# Patient Record
Sex: Female | Born: 1935 | Race: White | Hispanic: No | Smoking: Never smoker
Health system: Southern US, Community
[De-identification: ages and names within clinical notes are randomized; demographics above are authoritative.]

## PROBLEM LIST (undated history)

## (undated) DIAGNOSIS — M1990 Unspecified osteoarthritis, unspecified site: Secondary | ICD-10-CM

## (undated) DIAGNOSIS — F0390 Unspecified dementia without behavioral disturbance: Secondary | ICD-10-CM

## (undated) HISTORY — PX: EYE SURGERY: SHX253

## (undated) HISTORY — DX: Unspecified dementia, unspecified severity, without behavioral disturbance, psychotic disturbance, mood disturbance, and anxiety: F03.90

## (undated) HISTORY — DX: Unspecified osteoarthritis, unspecified site: M19.90

## (undated) HISTORY — PX: BREAST SURGERY: SHX581

---

## 2002-10-11 ENCOUNTER — Encounter: Payer: Self-pay | Admitting: Family Medicine

## 2002-10-11 ENCOUNTER — Ambulatory Visit (HOSPITAL_COMMUNITY): Admission: RE | Admit: 2002-10-11 | Discharge: 2002-10-11 | Payer: Self-pay | Admitting: Family Medicine

## 2005-02-01 ENCOUNTER — Emergency Department (HOSPITAL_COMMUNITY): Admission: EM | Admit: 2005-02-01 | Discharge: 2005-02-02 | Payer: Self-pay | Admitting: *Deleted

## 2005-02-03 ENCOUNTER — Ambulatory Visit (HOSPITAL_COMMUNITY): Admission: RE | Admit: 2005-02-03 | Discharge: 2005-02-03 | Payer: Self-pay | Admitting: Family Medicine

## 2005-05-14 ENCOUNTER — Ambulatory Visit: Payer: Self-pay | Admitting: Internal Medicine

## 2005-06-04 ENCOUNTER — Ambulatory Visit: Payer: Self-pay | Admitting: Internal Medicine

## 2005-06-04 ENCOUNTER — Encounter (INDEPENDENT_AMBULATORY_CARE_PROVIDER_SITE_OTHER): Payer: Self-pay | Admitting: Internal Medicine

## 2005-06-04 ENCOUNTER — Ambulatory Visit (HOSPITAL_COMMUNITY): Admission: RE | Admit: 2005-06-04 | Discharge: 2005-06-04 | Payer: Self-pay | Admitting: Internal Medicine

## 2005-07-14 ENCOUNTER — Ambulatory Visit: Payer: Self-pay | Admitting: Internal Medicine

## 2005-10-20 ENCOUNTER — Ambulatory Visit: Payer: Self-pay | Admitting: Internal Medicine

## 2005-11-17 ENCOUNTER — Ambulatory Visit: Payer: Self-pay | Admitting: Internal Medicine

## 2005-12-01 ENCOUNTER — Ambulatory Visit (HOSPITAL_COMMUNITY): Admission: RE | Admit: 2005-12-01 | Discharge: 2005-12-01 | Payer: Self-pay | Admitting: Family Medicine

## 2007-08-05 ENCOUNTER — Emergency Department (HOSPITAL_COMMUNITY): Admission: EM | Admit: 2007-08-05 | Discharge: 2007-08-05 | Payer: Self-pay | Admitting: Emergency Medicine

## 2007-11-18 ENCOUNTER — Ambulatory Visit (HOSPITAL_COMMUNITY): Admission: RE | Admit: 2007-11-18 | Discharge: 2007-11-18 | Payer: Self-pay | Admitting: Family Medicine

## 2007-11-26 ENCOUNTER — Emergency Department (HOSPITAL_COMMUNITY): Admission: EM | Admit: 2007-11-26 | Discharge: 2007-11-26 | Payer: Self-pay | Admitting: Emergency Medicine

## 2008-11-08 ENCOUNTER — Ambulatory Visit (HOSPITAL_COMMUNITY): Admission: RE | Admit: 2008-11-08 | Discharge: 2008-11-08 | Payer: Self-pay | Admitting: Family Medicine

## 2008-11-10 ENCOUNTER — Ambulatory Visit (HOSPITAL_COMMUNITY): Admission: RE | Admit: 2008-11-10 | Discharge: 2008-11-10 | Payer: Self-pay | Admitting: Family Medicine

## 2010-08-25 ENCOUNTER — Encounter: Payer: Self-pay | Admitting: Neurosurgery

## 2010-12-20 NOTE — Consult Note (Signed)
NAMEPATRIC, VANPELT                ACCOUNT NO.:  [id number]   MEDICAL RECORD NO.:  [id number]          PATIENT TYPE:  AMB   LOCATION:  DAY                           FACILITY:  [facility]   PHYSICIAN:  Lionel December, M.D.    DATE OF BIRTH:  13-Nov-1924   DATE OF CONSULTATION:  05/14/2005  DATE OF DISCHARGE:                                   CONSULTATION   PRESENTING COMPLAINT:  Intractable constipation.   HISTORY OF PRESENT ILLNESS:  [name] is a 77 year old Caucasian female who  was referred through the courtesy of Dr. Nobie Putnam for GI evaluation.  She  states she has had constipation for several years, has been gradually  getting worse.  Back in July, she developed fecal impaction.  She recalls  stool got so dry and hard that it would not come out.  This was associated  with abdominal pain, but no nausea and vomiting.  She has tried lactulose  and bulk forming agents in the past, in addition to, a lot of OTC  medications, but she does not remember the effects.  She states that with  the medicine she is taking now, she has pasty, multiple stools per day.  Today, she has had six stools.  She is also bothered by the fact that she  cannot keep herself clean.  She has a good appetite.  She has not lost any  weight recently.  She denies melena or rectal bleeding.  She had colonoscopy  by me in October 2001 with removal of two tiny polyps which were  inflammatory.  She denies heartburn, dysphagia.   MEDICATIONS:  1.  Dulcolax 17 g daily.  2.  Zelnorm 6 mg b.i.d.  3.  Evista 60 mg daily.  4.  Glucosamine one daily.  5.  Calcium daily.  6.  Red Yeast Rice daily.  7.  Garlic daily.   PAST MEDICAL HISTORY:  1.  Negative other than chronic constipation.  2.  A benign lump was removed from each breast back in 1974.  3.  Bilateral cataract surgery.   ALLERGIES:  NK.   FAMILY HISTORY:  Both parents died because of heart problems, mother aged 6  and father age 16.  She has five sisters and two  brothers living.  Three  sisters and two brothers are deceased.  One sister died of lung cancer at  age 42, another one of colon carcinoma in her 24s.  One sister died of  diabetic complications.  One brother died of leukemia in his 4s.  Another  brother died of unknown cancer at age 26.  A third brother had CAD and  diabetes mellitus.   SOCIAL HISTORY:  She is married.  She does not have any children.  She  worked at [employer] for 39 years before she retired.  She has  never smoked cigarettes or drank alcohol.   PHYSICAL EXAMINATION:  GENERAL APPEARANCE:  Pleasant, well-developed, well-  nourished Caucasian female who is in no acute distress.  VITAL SIGNS:  Weight 131, height 5 feet 4 inches, pulse 64, blood pressure  120/62,  temperature 98.1.  HEENT:  Conjunctivae is pink.  Sclerae is  nonicteric.  Oropharyngeal mucosa is normal.  NECK:  Without masses or thyromegaly.  CARDIAC:  Regular rhythm.  Normal S1, S2.  No murmur or gallop noted.  LUNGS:  Clear to auscultation.  ABDOMEN:  Symmetric.  Bowel sounds are normal.  On palpation, soft and  nontender.  Sigmoid colon is palpable.  No hepatosplenomegaly noted.  RECTAL:  Normal sphincter tone.  She has scant amount of stool in the vault  which is guaiac negative.  No edema or clubbing.   LABORATORY DATA:  On July 2006, when she was admitted for infection and  abdominal pain, WBC 7.4, H&H 12 and 34.6, platelets 219,000.  Chemistry  panel was normal.  Serum calcium was 9.2.  Labs available, I do not see TSH.   She also had a chest CT which revealed multiple areas of lung scarring and  no definite mass.  Follow up CT in 4-6 months was recommended.   ASSESSMENT:  Claris is a healthy 77 year old Caucasian female who has a  several year history of constipation which is progressive.  She does not  have any alarm symptoms.  Her last colonoscopy was five years ago and was  unremarkable other than two inflammatory polyps.  I  suspect she has some  colonic dysmotility.  Hypothyroidism needs to be resolved, although,  clinically she does not appear to be hyperthyroid.  She is having multiple  pasty stools.  Feel this is because of combination therapy.  Would like to  try her on one other regime and see what happens.   Positive family history of colon carcinoma as well as multiple other  malignancies. Therefore, she should undergo high risk screening colonoscopy.   RECOMMENDATIONS:  1.  High fiber diet.  2.  Trial with Fiber Choice two tablets daily or Benefiber 4 g daily,      samples given.  3.  We will check her hospital records, and do a TSH, unless she has had one      within the last year.  4.  She will continue Zelnorm at 6 mg p.o. b.i.d., but stop Glycolax.  5.  She will keep a stool diary.  She will undergo colonoscopy in 2-3 weeks.      Further changes in therapy will be made at that time.  6.  We will plan to see her back in the office in about two months.   We would like to thank Dr. Nobie Putnam for the opportunity to participate in  the care of this nice lady.      Lionel December, M.D.  Electronically Signed     NR/MEDQ  D:  05/14/2005  T:  05/15/2005  Job:  [id number]

## 2010-12-20 NOTE — Op Note (Signed)
[name], WAXMAN                ACCOUNT NO.:  [id number]   MEDICAL RECORD NO.:  [id number]          PATIENT TYPE:  AMB   LOCATION:  DAY                           FACILITY:  [facility]   PHYSICIAN:  Lionel December, M.D.    DATE OF BIRTH:  1925-06-02   DATE OF PROCEDURE:  06/04/2005  DATE OF DISCHARGE:                                 OPERATIVE REPORT   PROCEDURE:  Colonoscopy.   INDICATIONS:  Arna is a 77 year old Caucasian female who who presents with  progressive constipation. She recently had to be treated for fecal  impaction. Her last colonoscopy was five years ago with inflammatory polyps  only. Family history is positive for colon carcinoma as well as multiple  other malignancies. One sister died of colon carcinoma, one sister died of  lung carcinoma, a brother had leukemia, and yet another brother died of  unknown malignancy.   Procedure risks were reviewed with the patient, and informed consent was  obtained.   PREMEDICATION:  Demerol 25 mg IV, Versed 3 mg IV.   FINDINGS:  Procedure performed in endoscopy suite. The patient's vital signs  and O2 saturation were monitored during the procedure and remained stable.  The patient was placed in left lateral position and rectal examination  performed. No abnormality noted on external or digital exam. Olympus  videoscope was placed in the rectum where mucosa was noted to be densely  pigmented and almost black. Slowly and carefully scope was passed into  sigmoid colon and beyond. There was virtual diffuse deposition of pigment  involving the colon. Scope was advanced to cecum which was identified by  appendiceal orifice and ileocecal valve. Pictures taken for the record.  There were two small polyps and/or pseudopolyps at cecum which were ablated  via by cold biopsy, and there were two more at transverse colon which were  ablated similar fashion. No other abnormalities noted. Scope was retroflexed  in rectum to examine anorectal  junction which was unremarkable. Endoscope  was straightened and withdrawn. The patient tolerated the procedure well.   FINAL DIAGNOSIS:  1.  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  2.  Four small polyps ablated via cold biopsy, two from cecum and two from      transverse colon.   RECOMMENDATIONS:  Since she is doing better with the Zelnorm and dietary  measures, she will continue this therapy, and she has an appointment to be  seen in the office in about six weeks. I will be contacting the patient with  biopsy results.      Lionel December, M.D.  Electronically Signed     NR/MEDQ  D:  06/04/2005  T:  06/04/2005  Job:  [id number]   cc:   Patrica Duel, M.D.  Fax: [phone]

## 2011-04-24 LAB — URINALYSIS, ROUTINE W REFLEX MICROSCOPIC
Bilirubin Urine: NEGATIVE
Nitrite: NEGATIVE
Specific Gravity, Urine: >1.03 — ABNORMAL HIGH
Urobilinogen, UA: 0.2

## 2011-04-24 LAB — I-STAT 8, (EC8 V) (CONVERTED LAB)
Glucose, Bld: 120 — ABNORMAL HIGH
Potassium: 3.7
TCO2: 26
pH, Ven: 7.404 — ABNORMAL HIGH

## 2011-04-24 LAB — URINE MICROSCOPIC-ADD ON: Urine-Other: NONE SEEN

## 2011-04-24 LAB — POCT I-STAT CREATININE: Operator id: 264761

## 2011-04-29 LAB — POCT CARDIAC MARKERS
Operator id: 218581
Troponin i, poc: <0.05

## 2011-07-26 ENCOUNTER — Emergency Department (HOSPITAL_COMMUNITY)
Admission: EM | Admit: 2011-07-26 | Discharge: 2011-07-26 | Disposition: A | Discharge status: Home / Self Care | Payer: Medicare Other | Attending: Emergency Medicine | Admitting: Emergency Medicine

## 2011-07-26 ENCOUNTER — Encounter: Payer: Self-pay | Admitting: *Deleted

## 2011-07-26 DIAGNOSIS — R070 Pain in throat: Secondary | ICD-10-CM | POA: Insufficient documentation

## 2011-07-26 DIAGNOSIS — R059 Cough, unspecified: Secondary | ICD-10-CM | POA: Insufficient documentation

## 2011-07-26 DIAGNOSIS — M542 Cervicalgia: Secondary | ICD-10-CM | POA: Insufficient documentation

## 2011-07-26 DIAGNOSIS — R05 Cough: Secondary | ICD-10-CM | POA: Insufficient documentation

## 2011-07-26 DIAGNOSIS — J019 Acute sinusitis, unspecified: Secondary | ICD-10-CM

## 2011-07-26 DIAGNOSIS — R490 Dysphonia: Secondary | ICD-10-CM | POA: Insufficient documentation

## 2011-07-26 DIAGNOSIS — J069 Acute upper respiratory infection, unspecified: Secondary | ICD-10-CM

## 2011-07-26 DIAGNOSIS — R599 Enlarged lymph nodes, unspecified: Secondary | ICD-10-CM | POA: Insufficient documentation

## 2011-07-26 DIAGNOSIS — J3489 Other specified disorders of nose and nasal sinuses: Secondary | ICD-10-CM | POA: Insufficient documentation

## 2011-07-26 MED ORDER — AZITHROMYCIN 250 MG PO TABS: 500.0000 mg | ORAL_TABLET | Freq: Every day | ORAL | Status: AC

## 2011-07-26 MED ORDER — IBUPROFEN 400 MG PO TABS: 400.0000 mg | ORAL_TABLET | Freq: Three times a day (TID) | ORAL | Status: AC | PRN

## 2011-07-26 NOTE — ED Provider Notes (Signed)
This chart was scribed for Lydia Bonier, MD by [name]. The patient was seen in room [facility] and the patient's care was started at 10:26 PM.   CSN: [id number]  Arrival date & time 07/26/11  2107   First MD Initiated Contact with Patient 07/26/11 2155      Chief Complaint  Patient presents with  . Sore Throat  . Cough  . Nasal Congestion  . Neck Pain    (Consider location/radiation/quality/duration/timing/severity/associated sxs/prior treatment) HPI  Pt seen at 10:26 PM [name] is a 77 y.o. female who presents to the Emergency Department complaining of gradual onset, gradually worsening, moderate cough that started 3-4 weeks ago. Pt c/o associated sore throat, congestion, hoarseness, right neck pain, and swollen lymph node in neck. Pt has self treated with Mucinex with moderate relief of sx.  Pt denies fever, n/v/d.    History reviewed. No pertinent past medical history.  History reviewed. No pertinent past surgical history.  History reviewed. No pertinent family history.  History  Substance Use Topics  . Smoking status: Never Smoker   . Smokeless tobacco: Not on file  . Alcohol Use: No    OB History    Grav Para Term Preterm Abortions TAB SAB Ect Mult Living                  Review of Systems  10 Systems reviewed and are negative for acute change except as noted in the HPI.   Allergies  Review of patient's allergies indicates no known allergies.  Home Medications   Current Outpatient Rx  Name Route Sig Dispense Refill  . VITAMIN C PO Oral Take 1 tablet by mouth daily. Patient unsure of strength     . VITAMIN D PO Oral Take 1 tablet by mouth daily. Patient unsure of strength     . PRESCRIPTION MEDICATION Oral Take 1 tablet by mouth daily. Patient takes medication for nerves unsure of name and strength     . RALOXIFENE HCL 60 MG PO TABS Oral Take 60 mg by mouth daily.        BP 150/68  Pulse 86  Temp 98.1 F (36.7 C)  Resp 20  Ht  5\' 2"  (1.575 m)  Wt 119 lb (53.978 kg)  BMI 21.77 kg/m2  SpO2 100%  Physical Exam  Nursing note and vitals reviewed. Constitutional: She is oriented to person, place, and time. She appears well-developed and well-nourished. No distress.  HENT:  Head: Normocephalic and atraumatic.  Mouth/Throat: Oropharynx is clear and moist.       Right TM normal Left TM normal Moist mucus membranes  Eyes: EOM are normal. Pupils are equal, round, and reactive to light. No scleral icterus.  Neck: Neck supple. No tracheal deviation present.       Right postauricular lymphadenopathy Bilateral cervical chain lymphadenopathy  Cardiovascular: Normal rate, regular rhythm and normal heart sounds.   Pulmonary/Chest: Effort normal and breath sounds normal. No respiratory distress. She has no wheezes. She has no rales.  Abdominal: Soft. She exhibits no distension.  Musculoskeletal: Normal range of motion. She exhibits no edema.  Neurological: She is alert and oriented to person, place, and time. No sensory deficit.  Skin: Skin is warm and dry.  Psychiatric: She has a normal mood and affect. Her behavior is normal.    ED Course  Procedures (including critical care time) DIAGNOSTIC STUDIES: Oxygen Saturation is 100% on room air, normal by my interpretation.    COORDINATION OF CARE:  Labs Reviewed - No data to display No results found.   No diagnosis found.    MDM    The pt has had nasal congestion and post nasal drip for approximately 4 weeks without fever and on examination she has nasal congestion but no sign of ear or throat infection and no apparent pneumonia. At this time I have dx her with a sinus infection and will initiate antibiotics and pt will follow up with pcp.    I personally performed the services described in this documentation, which was scribed in my presence. The recorded information has been reviewed and considered.   Lydia Bonier, MD 07/26/11 [phone]

## 2011-07-26 NOTE — ED Notes (Signed)
Pt c/o sore throat, cough, congestion, hoarse, and right sided neck pain (3-4 months)

## 2011-09-15 ENCOUNTER — Other Ambulatory Visit (HOSPITAL_COMMUNITY): Payer: Self-pay | Admitting: Family Medicine

## 2011-09-15 DIAGNOSIS — R220 Localized swelling, mass and lump, head: Secondary | ICD-10-CM

## 2011-09-15 DIAGNOSIS — R221 Localized swelling, mass and lump, neck: Secondary | ICD-10-CM

## 2011-09-15 DIAGNOSIS — Z79899 Other long term (current) drug therapy: Secondary | ICD-10-CM | POA: Diagnosis not present

## 2011-09-15 DIAGNOSIS — Z139 Encounter for screening, unspecified: Secondary | ICD-10-CM

## 2011-09-15 DIAGNOSIS — M810 Age-related osteoporosis without current pathological fracture: Secondary | ICD-10-CM | POA: Diagnosis not present

## 2011-09-15 DIAGNOSIS — IMO0002 Reserved for concepts with insufficient information to code with codable children: Secondary | ICD-10-CM | POA: Diagnosis not present

## 2011-09-19 ENCOUNTER — Ambulatory Visit (HOSPITAL_COMMUNITY)
Admission: RE | Admit: 2011-09-19 | Discharge: 2011-09-19 | Disposition: A | Discharge status: Home / Self Care | Payer: Medicare Other | Source: Ambulatory Visit | Attending: Family Medicine | Admitting: Family Medicine

## 2011-09-19 DIAGNOSIS — M810 Age-related osteoporosis without current pathological fracture: Secondary | ICD-10-CM | POA: Diagnosis not present

## 2011-09-19 DIAGNOSIS — M818 Other osteoporosis without current pathological fracture: Secondary | ICD-10-CM | POA: Insufficient documentation

## 2011-09-19 DIAGNOSIS — D101 Benign neoplasm of tongue: Secondary | ICD-10-CM | POA: Diagnosis not present

## 2011-09-19 DIAGNOSIS — Z139 Encounter for screening, unspecified: Secondary | ICD-10-CM

## 2011-09-19 DIAGNOSIS — R220 Localized swelling, mass and lump, head: Secondary | ICD-10-CM | POA: Insufficient documentation

## 2011-09-19 DIAGNOSIS — Z13820 Encounter for screening for osteoporosis: Secondary | ICD-10-CM | POA: Insufficient documentation

## 2011-09-19 DIAGNOSIS — I6529 Occlusion and stenosis of unspecified carotid artery: Secondary | ICD-10-CM | POA: Diagnosis not present

## 2011-09-19 DIAGNOSIS — R221 Localized swelling, mass and lump, neck: Secondary | ICD-10-CM

## 2011-09-19 DIAGNOSIS — K219 Gastro-esophageal reflux disease without esophagitis: Secondary | ICD-10-CM | POA: Diagnosis not present

## 2011-09-19 DIAGNOSIS — R490 Dysphonia: Secondary | ICD-10-CM | POA: Diagnosis not present

## 2011-09-19 DIAGNOSIS — R05 Cough: Secondary | ICD-10-CM | POA: Diagnosis not present

## 2011-09-24 DIAGNOSIS — H9209 Otalgia, unspecified ear: Secondary | ICD-10-CM | POA: Diagnosis not present

## 2011-09-24 DIAGNOSIS — K219 Gastro-esophageal reflux disease without esophagitis: Secondary | ICD-10-CM | POA: Diagnosis not present

## 2011-09-24 DIAGNOSIS — D101 Benign neoplasm of tongue: Secondary | ICD-10-CM | POA: Diagnosis not present

## 2011-09-24 DIAGNOSIS — R490 Dysphonia: Secondary | ICD-10-CM | POA: Diagnosis not present

## 2011-10-10 DIAGNOSIS — J309 Allergic rhinitis, unspecified: Secondary | ICD-10-CM | POA: Diagnosis not present

## 2011-10-10 DIAGNOSIS — IMO0002 Reserved for concepts with insufficient information to code with codable children: Secondary | ICD-10-CM | POA: Diagnosis not present

## 2011-11-12 DIAGNOSIS — M898X9 Other specified disorders of bone, unspecified site: Secondary | ICD-10-CM | POA: Diagnosis not present

## 2011-11-12 DIAGNOSIS — M2040 Other hammer toe(s) (acquired), unspecified foot: Secondary | ICD-10-CM | POA: Diagnosis not present

## 2011-11-20 ENCOUNTER — Other Ambulatory Visit: Payer: Self-pay | Admitting: Cardiology

## 2011-11-20 DIAGNOSIS — I739 Peripheral vascular disease, unspecified: Secondary | ICD-10-CM

## 2011-11-25 ENCOUNTER — Encounter (INDEPENDENT_AMBULATORY_CARE_PROVIDER_SITE_OTHER): Payer: Medicare Other

## 2011-11-25 DIAGNOSIS — I739 Peripheral vascular disease, unspecified: Secondary | ICD-10-CM

## 2012-03-20 ENCOUNTER — Emergency Department (HOSPITAL_COMMUNITY): Payer: Medicare Other

## 2012-03-20 ENCOUNTER — Encounter (HOSPITAL_COMMUNITY): Payer: Self-pay | Admitting: Emergency Medicine

## 2012-03-20 ENCOUNTER — Emergency Department (HOSPITAL_COMMUNITY)
Admission: EM | Admit: 2012-03-20 | Discharge: 2012-03-20 | Disposition: A | Discharge status: Home / Self Care | Payer: Medicare Other | Attending: Emergency Medicine | Admitting: Emergency Medicine

## 2012-03-20 DIAGNOSIS — R079 Chest pain, unspecified: Secondary | ICD-10-CM | POA: Insufficient documentation

## 2012-03-20 DIAGNOSIS — J984 Other disorders of lung: Secondary | ICD-10-CM | POA: Insufficient documentation

## 2012-03-20 DIAGNOSIS — R071 Chest pain on breathing: Secondary | ICD-10-CM | POA: Diagnosis not present

## 2012-03-20 DIAGNOSIS — Z043 Encounter for examination and observation following other accident: Secondary | ICD-10-CM | POA: Diagnosis not present

## 2012-03-20 LAB — POCT I-STAT, CHEM 8
Chloride: 103 mEq/L (ref 96–112)
Glucose, Bld: 94 mg/dL (ref 70–99)
HCT: 34 % — ABNORMAL LOW (ref 36.0–46.0)
Potassium: 3.7 mEq/L (ref 3.5–5.1)
Sodium: 138 mEq/L (ref 135–145)

## 2012-03-20 LAB — POCT I-STAT TROPONIN I: Troponin i, poc: 0 ng/mL (ref 0.00–0.08)

## 2012-03-20 LAB — CBC
HCT: 33.8 % — ABNORMAL LOW (ref 36.0–46.0)
Platelets: 144 10*3/uL — ABNORMAL LOW (ref 150–400)
RDW: 12.5 % (ref 11.5–15.5)
WBC: 3.7 10*3/uL — ABNORMAL LOW (ref 4.0–10.5)

## 2012-03-20 LAB — LIPASE, BLOOD: Lipase: 23 U/L (ref 11–59)

## 2012-03-20 MED ORDER — HYDROCODONE-ACETAMINOPHEN 5-325 MG PO TABS
1.0000 | ORAL_TABLET | Freq: Once | ORAL | Status: AC
  Administered 2012-03-20: 1 via ORAL
  Filled 2012-03-20: qty 1

## 2012-03-20 MED ORDER — ONDANSETRON HCL 4 MG/2ML IJ SOLN
4.0000 mg | Freq: Once | INTRAMUSCULAR | Status: AC
  Administered 2012-03-20: 4 mg via INTRAVENOUS
  Filled 2012-03-20: qty 2

## 2012-03-20 MED ORDER — MORPHINE SULFATE 2 MG/ML IJ SOLN
2.0000 mg | Freq: Once | INTRAMUSCULAR | Status: AC
  Administered 2012-03-20: 2 mg via INTRAVENOUS
  Filled 2012-03-20: qty 1

## 2012-03-20 MED ORDER — HYDROCODONE-ACETAMINOPHEN 5-500 MG PO TABS: 1.0000 | ORAL_TABLET | Freq: Four times a day (QID) | ORAL | Status: AC | PRN

## 2012-03-20 MED ORDER — IOHEXOL 350 MG/ML SOLN
100.0000 mL | Freq: Once | INTRAVENOUS | Status: AC | PRN
  Administered 2012-03-20: 100 mL via INTRAVENOUS

## 2012-03-20 NOTE — ED Notes (Signed)
Patient c/o left rib/side pain "for a while".  Patient states she fell last week.

## 2012-03-20 NOTE — ED Provider Notes (Signed)
History     CSN: [id number]  Arrival date & time 03/20/12  0210   First MD Initiated Contact with Patient 03/20/12 0251      Chief Complaint  Patient presents with  . Pleurisy    (Consider location/radiation/quality/duration/timing/severity/associated sxs/prior treatment) HPI HX per patient, L sided CP worse with deep breaths ongoing for the last 3 -4 days, seems to be worse at night when lays down to go to sleep. Worse with deep breaths, no SOB. No cough, did fall about a week ago. No sig pain at that time. No leg pain/ swelling, no rash. No N/V/D. No F/C. No h/o same. Does eat a lot of cabbage and beans and concerned it may be gas. Mod in severity, no radiation of pain described as dull and crampy History reviewed. No pertinent past medical history.  History reviewed. No pertinent past surgical history.  No family history on file.  History  Substance Use Topics  . Smoking status: Never Smoker   . Smokeless tobacco: Not on file  . Alcohol Use: No    OB History    Grav Para Term Preterm Abortions TAB SAB Ect Mult Living                  Review of Systems  Constitutional: Negative for fever and chills.  HENT: Negative for neck pain and neck stiffness.   Eyes: Negative for pain.  Respiratory: Negative for shortness of breath.   Cardiovascular: Positive for chest pain.  Gastrointestinal: Negative for abdominal pain.  Genitourinary: Negative for dysuria.  Musculoskeletal: Negative for back pain.  Skin: Negative for rash.  Neurological: Negative for headaches.  All other systems reviewed and are negative.    Allergies  Review of patient's allergies indicates no known allergies.  Home Medications   Current Outpatient Rx  Name Route Sig Dispense Refill  . VITAMIN C PO Oral Take 1 tablet by mouth daily. Patient unsure of strength     . VITAMIN D PO Oral Take 1 tablet by mouth daily. Patient unsure of strength     . RALOXIFENE HCL 60 MG PO TABS Oral Take 60 mg by  mouth daily.      Marland Kitchen PRESCRIPTION MEDICATION Oral Take 1 tablet by mouth daily. Patient takes medication for nerves unsure of name and strength       BP 188/88  Pulse 94  Temp 98.2 F (36.8 C) (Oral)  Resp 18  Ht 5\' 2"  (1.575 m)  Wt 118 lb (53.524 kg)  BMI 21.58 kg/m2  SpO2 99%  Physical Exam  Constitutional: She is oriented to person, place, and time. She appears well-developed and well-nourished.  HENT:  Head: Normocephalic and atraumatic.  Eyes: Conjunctivae and EOM are normal. Pupils are equal, round, and reactive to light.  Neck: Trachea normal. Neck supple. No thyromegaly present.  Cardiovascular: Normal rate, regular rhythm, S1 normal, S2 normal and normal pulses.     No systolic murmur is present   No diastolic murmur is present  Pulses:      Radial pulses are 2+ on the right side, and 2+ on the left side.  Pulmonary/Chest: Effort normal and breath sounds normal. She has no wheezes. She has no rhonchi. She has no rales. She exhibits no tenderness.       Localizes pain to L lateral chest wall. No rash or crepitus  Abdominal: Soft. Normal appearance and bowel sounds are normal. There is no tenderness. There is no CVA tenderness and negative Murphy's sign.  Musculoskeletal:       BLE:s Calves nontender, no cords or erythema, negative Homans sign  Neurological: She is alert and oriented to person, place, and time. She has normal strength. No cranial nerve deficit or sensory deficit. GCS eye subscore is 4. GCS verbal subscore is 5. GCS motor subscore is 6.  Skin: Skin is warm and dry. No rash noted. She is not diaphoretic.  Psychiatric: Her speech is normal.       Cooperative and appropriate    ED Course  Procedures (including critical care time)  ' Results for orders placed during the hospital encounter of 03/20/12  CBC      Component Value Range   WBC 3.7 (*) 4.0 - 10.5 K/uL   RBC 3.65 (*) 3.87 - 5.11 MIL/uL   Hemoglobin 11.5 (*) 12.0 - 15.0 g/dL   HCT 96.2 (*) 95.2  - 46.0 %   MCV 92.6  78.0 - 100.0 fL   MCH 31.5  26.0 - 34.0 pg   MCHC 34.0  30.0 - 36.0 g/dL   RDW 84.1  32.4 - 40.1 %   Platelets 144 (*) 150 - 400 K/uL  POCT I-STAT, CHEM 8      Component Value Range   Sodium 138  135 - 145 mEq/L   Potassium 3.7  3.5 - 5.1 mEq/L   Chloride 103  96 - 112 mEq/L   BUN 5 (*) 6 - 23 mg/dL   Creatinine, Ser 0.27  0.50 - 1.10 mg/dL   Glucose, Bld 94  70 - 99 mg/dL   Calcium, Ion 2.53  6.64 - 1.30 mmol/L   TCO2 26  0 - 100 mmol/L   Hemoglobin 11.6 (*) 12.0 - 15.0 g/dL   HCT 40.3 (*) 47.4 - 25.9 %  POCT I-STAT TROPONIN I      Component Value Range   Troponin i, poc 0.00  0.00 - 0.08 ng/mL   Comment 3           LIPASE, BLOOD      Component Value Range   Lipase 23  11 - 59 U/L   Dg Chest 2 View  03/20/2012  *RADIOLOGY REPORT*  Clinical Data: Left rib pain.  Fell last week.  CHEST - 2 VIEW  Comparison: 11/26/2007  Findings: Normal heart size and pulmonary vascularity. Emphysematous changes and scattered fibrosis in the lungs.  Chronic blunting of the right costophrenic angle suggesting pleural thickening.  No focal airspace consolidation in the lungs.  No pneumothorax.  Mediastinal contours appear intact.  Calcification of the aorta.  No significant change since previous study.  IMPRESSION: Emphysematous changes and scattered fibrosis in the lungs.  Right pleural thickening.  No acute abnormalities.  Original Report Authenticated By: Marlon Pel, M.D.   Ct Angio Chest Pe W/cm &/or Wo Cm  03/20/2012  *RADIOLOGY REPORT*  Clinical Data: Left-sided chest and rib pain.  Fell last week.  CT ANGIOGRAPHY CHEST  Technique:  Multidetector CT imaging of the chest using the standard protocol during bolus administration of intravenous contrast. Multiplanar reconstructed images including MIPs were obtained and reviewed to evaluate the vascular anatomy.  Contrast: OMNIPAQUE IOHEXOL 350 MG/ML SOLN  Comparison: 02/03/2005  Findings: Technically adequate study with  good opacification of the central and segmental pulmonary arteries.  No focal filling defects.  No evidence of significant pulmonary embolus.  Mild cardiac enlargement.  Coronary artery calcification. Calcification of the thoracic aorta without aneurysm.  Calcified hilar lymph nodes.  No significant lymphadenopathy  in the chest. The esophagus is decompressed.  Emphysematous changes and fibrosis in the lungs.  Scarring in the right lung base anteriorly.  A focal area the patient in the right upper lung with radiating scar to the pleura.  This measures about 6 x 8 mm diameter.  Similar changes are present on the previous study without significant interval enlargement, suggesting benign etiology.  No ACE consolidation.  No pneumothorax.  No pleural effusions.  Visualized ribs and upper abdominal organs appear intact.  IMPRESSION: No evidence of significant pulmonary embolus.  Stable emphysematous changes and scarring in the lungs.  Original Report Authenticated By: Marlon Pel, M.D.      Date: 03/20/2012  Rate: 87  Rhythm: normal sinus rhythm  QRS Axis: normal  Intervals: normal  ST/T Wave abnormalities: nonspecific ST changes  Conduction Disutrbances:none  Narrative Interpretation:   Old EKG Reviewed: unchanged  CP pleuritic in nature.   IV morphine. On recheck pain resolved. Neg troponin with 3 days of unchanged pain. ECG does not show evidence of ischemia. CT scan without PE. Normal lipase.CXR without rib FX or PTX.   Given improved condition and CP that does not suggest cardiac etiology, feel PT is appropriate for discharge home and outpatient follow up for further evaluation. RX provided and strict return precautions verbalized as understood.   MDM  Nursing notes and VS reviewed. IV narcotics, Imaging and labs reviewed as above.         Sunnie Nielsen, MD 03/20/12 [phone]

## 2012-03-20 NOTE — ED Notes (Signed)
Discharge instructions reviewed with pt, questions answered. Pt verbalized understanding.  

## 2012-03-25 DIAGNOSIS — IMO0002 Reserved for concepts with insufficient information to code with codable children: Secondary | ICD-10-CM | POA: Diagnosis not present

## 2012-03-25 DIAGNOSIS — M549 Dorsalgia, unspecified: Secondary | ICD-10-CM | POA: Diagnosis not present

## 2012-03-25 DIAGNOSIS — M159 Polyosteoarthritis, unspecified: Secondary | ICD-10-CM | POA: Diagnosis not present

## 2012-04-09 DIAGNOSIS — IMO0002 Reserved for concepts with insufficient information to code with codable children: Secondary | ICD-10-CM | POA: Diagnosis not present

## 2012-04-09 DIAGNOSIS — M810 Age-related osteoporosis without current pathological fracture: Secondary | ICD-10-CM | POA: Diagnosis not present

## 2012-07-28 ENCOUNTER — Emergency Department (HOSPITAL_COMMUNITY): Payer: Medicare Other

## 2012-07-28 ENCOUNTER — Emergency Department (HOSPITAL_COMMUNITY)
Admission: EM | Admit: 2012-07-28 | Discharge: 2012-07-28 | Disposition: A | Discharge status: Home / Self Care | Payer: Medicare Other | Attending: Emergency Medicine | Admitting: Emergency Medicine

## 2012-07-28 DIAGNOSIS — S0990XA Unspecified injury of head, initial encounter: Secondary | ICD-10-CM | POA: Insufficient documentation

## 2012-07-28 DIAGNOSIS — W06XXXA Fall from bed, initial encounter: Secondary | ICD-10-CM | POA: Insufficient documentation

## 2012-07-28 DIAGNOSIS — Y9389 Activity, other specified: Secondary | ICD-10-CM | POA: Insufficient documentation

## 2012-07-28 DIAGNOSIS — Y929 Unspecified place or not applicable: Secondary | ICD-10-CM | POA: Insufficient documentation

## 2012-07-28 DIAGNOSIS — S2249XA Multiple fractures of ribs, unspecified side, initial encounter for closed fracture: Secondary | ICD-10-CM | POA: Insufficient documentation

## 2012-07-28 MED ORDER — HYDROCODONE-ACETAMINOPHEN 5-325 MG PO TABS: ORAL_TABLET | ORAL | Status: DC

## 2012-07-28 MED ORDER — HYDROCODONE-ACETAMINOPHEN 5-325 MG PO TABS
0.5000 | ORAL_TABLET | Freq: Once | ORAL | Status: AC
  Administered 2012-07-28: 0.5 via ORAL
  Filled 2012-07-28: qty 1

## 2012-07-28 MED ORDER — DOCUSATE SODIUM 100 MG PO CAPS: 100.0000 mg | ORAL_CAPSULE | Freq: Two times a day (BID) | ORAL | Status: DC

## 2012-07-28 NOTE — ED Provider Notes (Signed)
History     CSN: [id number]  Arrival date & time 07/28/12  0801   First MD Initiated Contact with Patient 07/28/12 [phone]      Chief Complaint  Patient presents with  . Rib Injury    (Consider location/radiation/quality/duration/timing/severity/associated sxs/prior treatment) HPI Comments: [name] presents with pain in her right rib cage after falling last night,  Hitting her right ribs against her night stand, and also had a slight glancing blow to her scalp without syncope of loc.  She describes spraying her sheets with lysol,  Then when she sat on the edge of the bed,  Her gown was slick,  Causing her to slide off the bed.  She denies shortness of breath and any other pain including no back, neck,  Hip or extremity pain or injury.  She has taken no medications prior to arrival.  She does have a history significant for osteoporosis.  The history is provided by the patient.    No past medical history on file.  No past surgical history on file.  No family history on file.  History  Substance Use Topics  . Smoking status: Never Smoker   . Smokeless tobacco: Not on file  . Alcohol Use: No    OB History    Grav Para Term Preterm Abortions TAB SAB Ect Mult Living                  Review of Systems  Constitutional: Negative for fever and chills.  HENT: Negative for neck pain.   Respiratory: Negative for cough and shortness of breath.   Cardiovascular: Positive for chest pain. Negative for palpitations and leg swelling.  Gastrointestinal: Negative for abdominal pain, constipation and abdominal distention.  Genitourinary: Negative for dysuria, urgency, frequency, flank pain and difficulty urinating.  Musculoskeletal: Negative for back pain, joint swelling and gait problem.  Skin: Negative for rash.  Neurological: Negative for weakness and numbness.    Allergies  Review of patient's allergies indicates no known allergies.  Home Medications   Current Outpatient Rx   Name  Route  Sig  Dispense  Refill  . VITAMIN C PO   Oral   Take 1 tablet by mouth daily. Patient unsure of strength          . VITAMIN D PO   Oral   Take 1 tablet by mouth daily. Patient unsure of strength          . PRESCRIPTION MEDICATION   Oral   Take 1 tablet by mouth daily. Patient takes medication for nerves unsure of name and strength          . RALOXIFENE HCL 60 MG PO TABS   Oral   Take 60 mg by mouth daily.             BP 177/72  Pulse 86  Temp 98.4 F (36.9 C) (Oral)  Resp 18  Ht 5\' 2"  (1.575 m)  Wt 118 lb (53.524 kg)  BMI 21.58 kg/m2  SpO2 98%  Physical Exam  Nursing note and vitals reviewed. Constitutional: She appears well-developed and well-nourished. No distress.       Appears uncomfortable with movement only.  HENT:  Head: Normocephalic and atraumatic.  Eyes: Conjunctivae normal are normal.  Neck: Normal range of motion.  Cardiovascular: Normal rate, regular rhythm, normal heart sounds and intact distal pulses.   Pulmonary/Chest: Effort normal and breath sounds normal. She has no wheezes. She exhibits bony tenderness. She exhibits no crepitus, no  edema, no deformity, no swelling and no retraction.    Abdominal: Soft. Bowel sounds are normal. There is no tenderness.  Musculoskeletal: Normal range of motion. She exhibits tenderness. She exhibits no edema.  Neurological: She is alert.  Skin: Skin is warm and dry.  Psychiatric: She has a normal mood and affect.    ED Course  Procedures (including critical care time)  Labs Reviewed - No data to display No results found.   No diagnosis found.  10:38 AM Pts pain improved with1/2 hydrocodone tablet.  Motivated to go home.  Family at bedside offering support at home.  Incentive spirometer sent home  With pt.    MDM  Pt with rib fractures after fall,  Relatively independent lives in her own home. Ambulated in dept without difficulty.  Prescribed hydrocodone,  Caution given,  Also pt has  miralax at home - advised to increase to bid from now qd dosing to avoid constipation from narcotic.Recheck by pcp in 1 week,  Return here sooner for any new problems including increased pain, sob or fever.   12:01 PM per family,  Do not feel comfortable with patient returning home,  As she lives alone.  Pt is motivated to go home.  Ambulated with discomfort,  But stable with gait.  Discussed with Dr Deretha Emory who agrees with call for admission.  After conversing with Dr. Sherrie Mustache,  Pt does not meet criteria for admission, since ambulatory,  Pain management issue only.  Discussed with family.  Pt and family agreeable for trial at home.  Suggested pt return here for any problems or concerns,  Or call pcp - if needed,  May need short term assisted placement or home health until recovered from injury.            Burgess Amor, PA 07/28/12 1249

## 2012-07-28 NOTE — ED Notes (Signed)
[employer] dispensed home with pt due to no stores being open.

## 2012-07-28 NOTE — ED Notes (Signed)
Pt fell out of bed last night and struck her left chest/ rib area on the night stand.

## 2012-07-28 NOTE — ED Provider Notes (Signed)
Medical screening examination/treatment/procedure(s) were conducted as a shared visit with non-physician practitioner(s) and myself.  I personally evaluated the patient during the encounter   Patient with fall last night while trying to get into bed. Stroke her left chest anterior area on the nightstand. Also struck her head. The main complaint is pain to her left lower anterior chest area. No loss of consciousness no complaint of any other injuries. We have ordered a chest x-ray with rib series and head CT for further evaluation.   Results for orders placed during the hospital encounter of 03/20/12  CBC      Component Value Range   WBC 3.7 (*) 4.0 - 10.5 K/uL   RBC 3.65 (*) 3.87 - 5.11 MIL/uL   Hemoglobin 11.5 (*) 12.0 - 15.0 g/dL   HCT 86.5 (*) 78.4 - 69.6 %   MCV 92.6  78.0 - 100.0 fL   MCH 31.5  26.0 - 34.0 pg   MCHC 34.0  30.0 - 36.0 g/dL   RDW 29.5  28.4 - 13.2 %   Platelets 144 (*) 150 - 400 K/uL  POCT I-STAT, CHEM 8      Component Value Range   Sodium 138  135 - 145 mEq/L   Potassium 3.7  3.5 - 5.1 mEq/L   Chloride 103  96 - 112 mEq/L   BUN 5 (*) 6 - 23 mg/dL   Creatinine, Ser 4.40  0.50 - 1.10 mg/dL   Glucose, Bld 94  70 - 99 mg/dL   Calcium, Ion 1.02  7.25 - 1.30 mmol/L   TCO2 26  0 - 100 mmol/L   Hemoglobin 11.6 (*) 12.0 - 15.0 g/dL   HCT 36.6 (*) 44.0 - 34.7 %  POCT I-STAT TROPONIN I      Component Value Range   Troponin i, poc 0.00  0.00 - 0.08 ng/mL   Comment 3           LIPASE, BLOOD      Component Value Range   Lipase 23  11 - 59 U/L   Dg Ribs Unilateral W/chest Right  07/28/2012  *RADIOLOGY REPORT*  Clinical Data: Fall, right posterior rib pain  RIGHT RIBS AND CHEST - 3+ VIEW  Comparison: Chest CT 03/20/2012  Findings: Mildly displaced fractures of the right lateral eighth, ninth, tenth, and eleventh ribs are noted.  Trace right pleural effusion is present.  Moderate cardiomegaly again noted.  The left lung is clear. No pneumothorax.  IMPRESSION: Mildly  displaced right posterolateral acute fractures of the eighth, ninth, tenth, and eleventh ribs.   Original Report Authenticated By: Christiana Pellant, M.D.    Ct Head Wo Contrast  07/28/2012  *RADIOLOGY REPORT*  Clinical Data: Fall  CT HEAD WITHOUT CONTRAST  Technique:  Contiguous axial images were obtained from the base of the skull through the vertex without contrast.  Comparison: None.  Findings: No skull fracture is noted.  Paranasal sinuses and mastoid air cells are unremarkable.  No intracranial hemorrhage, mass effect or midline shift.  No acute infarction.  Mild cerebral atrophy.  Periventricular mild white matter decreased attenuation is probable due to chronic small vessel ischemic changes.  No mass lesion is noted on this unenhanced scan.  IMPRESSION: No acute intracranial abnormality.  Mild cerebral atrophy. Periventricular mild white matter decreased attenuation probable due to chronic small vessel ischemic changes.   Original Report Authenticated By: Natasha Mead, M.D.     X-ray findings are consistent with rib fractures in the area. Head CT is  negative for any acute injuries. The left lung is clear no pneumothorax. As stated above right posterior lateral acute fractures of the eighth ninth 10th and 11th ribs.   Shelda Jakes, MD 07/28/12 1014

## 2012-07-29 NOTE — ED Provider Notes (Signed)
Medical screening examination/treatment/procedure(s) were conducted as a shared visit with non-physician practitioner(s) and myself.  I personally evaluated the patient during the encounter  Shelda Jakes, MD 07/29/12 [phone]

## 2012-08-05 MED FILL — Hydrocodone-Acetaminophen Tab 5-325 MG: ORAL | Qty: 6 | Status: AC

## 2012-08-11 DIAGNOSIS — IMO0002 Reserved for concepts with insufficient information to code with codable children: Secondary | ICD-10-CM | POA: Diagnosis not present

## 2012-08-11 DIAGNOSIS — S2239XA Fracture of one rib, unspecified side, initial encounter for closed fracture: Secondary | ICD-10-CM | POA: Diagnosis not present

## 2012-11-29 ENCOUNTER — Encounter: Payer: Self-pay | Admitting: Diagnostic Neuroimaging

## 2012-11-29 ENCOUNTER — Other Ambulatory Visit: Payer: Self-pay | Admitting: *Deleted

## 2012-11-29 ENCOUNTER — Ambulatory Visit (INDEPENDENT_AMBULATORY_CARE_PROVIDER_SITE_OTHER): Payer: Medicare Other | Admitting: Diagnostic Neuroimaging

## 2012-11-29 VITALS — BP 178/81 | HR 95 | Temp 97.9°F | Ht 63.5 in | Wt 116.0 lb

## 2012-11-29 DIAGNOSIS — F0390 Unspecified dementia without behavioral disturbance: Secondary | ICD-10-CM | POA: Diagnosis not present

## 2012-11-29 DIAGNOSIS — F329 Major depressive disorder, single episode, unspecified: Secondary | ICD-10-CM | POA: Diagnosis not present

## 2012-11-29 DIAGNOSIS — F3289 Other specified depressive episodes: Secondary | ICD-10-CM | POA: Diagnosis not present

## 2012-11-29 MED ORDER — MEMANTINE HCL ER 28 MG PO CP24: 28.0000 mg | ORAL_CAPSULE | Freq: Every day | ORAL | Status: DC

## 2012-11-29 MED ORDER — MEMANTINE HCL ER 7 & 14 & 21 &28 MG PO CP24: 1.0000 | ORAL_CAPSULE | Freq: Every day | ORAL | Status: DC

## 2012-11-29 NOTE — Progress Notes (Signed)
[facility]  PATIENT: [name] DOB: 21-Sep-1924  REFERRING CLINICIAN: Phillips Odor HISTORY FROM: patient and niece REASON FOR VISIT: new consult   HISTORICAL  CHIEF COMPLAINT:  Chief Complaint  Patient presents with  . Memory Loss    memory loss np room 7    HISTORY OF PRESENT ILLNESS:   79 year old right-handed female here for evaluation of memory loss. Patient is accompanied by her niece for this visit.  I asked the patient why she is here for this visit, and she proceeded to tell me a tangential story about assigning beneficiaries to her life insurance plan. Patient became very emotional, telling me that she had designated 6 of her nieces as the beneficiaries, but later felt guilty about not including her other 28 or so nieces and nephews and other relatives.  Patient's niece tells me that she's had at least one to 2 year history of progressive short-term memory loss. Patient is now forgetting names of close family members, friends, forgetting conversations and tasks. Patient continues to live alone in her own home. She still drives. She usually wakes up in the morning, gets ready, makes a bowl of cereal and takes care of small chores around the home. She has someone come to clean her house once a month. She has somewhat take care of regard were. Patient's grandniece Teacher, English as a foreign language) is her healthcare power of attorney. Unfortunately Herbert Seta is not here for this visit.  Patient was tried on donepezil for possible dementia, but had to stop this because this made her "nervous and anxious".  In general patient has been more emotional lately. She's having more episodes of depression and anxiety.  REVIEW OF SYSTEMS: Full 14 system review of systems performed and notable only for memory loss easy bruising shortness of breath fatigue palpitations hearing loss.  ALLERGIES: No Known Allergies  HOME MEDICATIONS: Outpatient Prescriptions Prior to Visit  Medication Sig  Dispense Refill  . Multiple Vitamins-Minerals (MULTIVITAMINS THER. W/MINERALS) TABS Take 1 tablet by mouth daily.      Marland Kitchen PRESCRIPTION MEDICATION Take 1 tablet by mouth daily. Patient takes medication for nerves unsure of name and strength      . raloxifene (EVISTA) 60 MG tablet Take 60 mg by mouth daily.        Marland Kitchen HYDROcodone-acetaminophen (NORCO/VICODIN) 5-325 MG per tablet Take 1/2 to 1 tablet every 4 hours if needed for pain relief.  15 tablet  0  . HYDROcodone-acetaminophen (NORCO/VICODIN) 5-325 MG per tablet 1/2 to 1 tablet every 4 hours prn pain  -  6 tablet  0   No facility-administered medications prior to visit.    PAST MEDICAL HISTORY: Past Medical History  Diagnosis Date  . Dementia     PAST SURGICAL HISTORY: No past surgical history on file.  FAMILY HISTORY: No family history on file.  SOCIAL HISTORY:  History   Social History  . Marital Status: Widowed    Spouse Name: N/A    Number of Children: 0  . Years of Education: 7th   Occupational History  .     Social History Main Topics  . Smoking status: Never Smoker   . Smokeless tobacco: Not on file  . Alcohol Use: No     Comment: Patient drinks coffee daily.  . Drug Use: No  . Sexually Active: Not on file   Other Topics Concern  . Not on file   Social History Narrative  . No narrative on file     PHYSICAL EXAM  Filed  Vitals:   11/29/12 1044  BP: 178/81  Pulse: 95  Temp: 97.9 F (36.6 C)  TempSrc: Oral  Height: 5' 3.5" (1.613 m)  Weight: 116 lb (52.617 kg)   Body mass index is 20.22 kg/(m^2).  GENERAL EXAM: Patient is in no distress  CARDIOVASCULAR: Regular rate and rhythm, no murmurs, no carotid bruits  NEUROLOGIC: MENTAL STATUS: awake, alert, language fluent, comprehension intact, naming intact; POSITIVE MYERSONS. POSITIVE PALMOMENTAL. NEG SNOUT. MMSE 19/30. GDS 6. AFT 4. CLOCK DRAWING 1. CRANIAL NERVE: no papilledema on fundoscopic exam, pupils equal and reactive to light, visual  fields full to confrontation, extraocular muscles intact, no nystagmus, facial sensation and strength symmetric, uvula midline, shoulder shrug symmetric, tongue midline. MOTOR: normal bulk and tone, full strength in the BUE, BLE SENSORY: normal and symmetric to light touch, pinprick, temperature, vibration COORDINATION: finger-nose-finger, fine finger movements normal REFLEXES: BUE 3, KNEES 3, ANKLES 2.  GAIT/STATION: narrow based gait; SLOW AND CAUTIOUS. Romberg is negative   DIAGNOSTIC DATA (LABS, IMAGING, TESTING) - I reviewed patient records, labs, notes, testing and imaging myself where available.  Lab Results  Component Value Date   WBC 3.7* 03/20/2012   HGB 11.6* 03/20/2012   HCT 34.0* 03/20/2012   MCV 92.6 03/20/2012   PLT 144* 03/20/2012      Component Value Date/Time   NA 138 03/20/2012 0347   K 3.7 03/20/2012 0347   CL 103 03/20/2012 0347   GLUCOSE 94 03/20/2012 0347   BUN 5* 03/20/2012 0347   CREATININE 0.80 03/20/2012 0347   No results found for this basename: CHOL, HDL, LDLCALC, LDLDIRECT, TRIG, CHOLHDL   No results found for this basename: HGBA1C   No results found for this basename: VITAMINB12   No results found for this basename: TSH   07/28/12 CT head - mild atrophy; mild chronic small vessel ischemic disease   ASSESSMENT AND PLAN  79 y.o. year old female  has a past medical history of Dementia. here with progressive short-term memory loss. Suspect mild to moderate dementia. We'll check lab testing for further evaluation. I will start namenda.  I strongly recommended patient transition away from driving and living alone. Fortunately she has strong family support. Ideally if someone can move in with the patient or patient's can move in with someone else this would be better for her in the long-term for safety. With respect to driving I think she should stop driving at this point, given her significant cognitive decline.   Orders Placed This Encounter  Procedures    . TSH   B12 testing ordered, but declined by insurance.   Suanne Marker, MD 11/29/2012, 11:40 AM Certified in Neurology, Neurophysiology and Neuroimaging  [facility] [address], [address] [place], [place] [address] [phone]

## 2012-11-29 NOTE — Patient Instructions (Signed)
Start namenda. Check labs.

## 2012-11-30 LAB — TSH: TSH: 2.77 u[IU]/mL (ref 0.450–4.500)

## 2012-11-30 NOTE — Progress Notes (Signed)
Quick Note:  I called pt and relayed normal TSH. She verbalized understanding. ______

## 2012-12-28 ENCOUNTER — Other Ambulatory Visit: Payer: Self-pay

## 2012-12-28 MED ORDER — MEMANTINE HCL ER 28 MG PO CP24: 28.0000 mg | ORAL_CAPSULE | Freq: Every day | ORAL | Status: DC

## 2013-02-16 DIAGNOSIS — IMO0002 Reserved for concepts with insufficient information to code with codable children: Secondary | ICD-10-CM | POA: Diagnosis not present

## 2013-02-16 DIAGNOSIS — F0390 Unspecified dementia without behavioral disturbance: Secondary | ICD-10-CM | POA: Diagnosis not present

## 2013-04-22 DIAGNOSIS — M2040 Other hammer toe(s) (acquired), unspecified foot: Secondary | ICD-10-CM | POA: Diagnosis not present

## 2013-04-22 DIAGNOSIS — Z961 Presence of intraocular lens: Secondary | ICD-10-CM | POA: Diagnosis not present

## 2013-04-22 DIAGNOSIS — H2640 Unspecified secondary cataract: Secondary | ICD-10-CM | POA: Diagnosis not present

## 2013-04-22 DIAGNOSIS — H3530 Unspecified macular degeneration: Secondary | ICD-10-CM | POA: Diagnosis not present

## 2013-04-22 DIAGNOSIS — H43819 Vitreous degeneration, unspecified eye: Secondary | ICD-10-CM | POA: Diagnosis not present

## 2013-04-22 DIAGNOSIS — M2010 Hallux valgus (acquired), unspecified foot: Secondary | ICD-10-CM | POA: Diagnosis not present

## 2013-04-22 DIAGNOSIS — Q828 Other specified congenital malformations of skin: Secondary | ICD-10-CM | POA: Diagnosis not present

## 2013-05-19 ENCOUNTER — Encounter: Payer: Self-pay | Admitting: Podiatry

## 2013-05-19 ENCOUNTER — Ambulatory Visit (INDEPENDENT_AMBULATORY_CARE_PROVIDER_SITE_OTHER): Payer: Medicare Other | Admitting: Podiatry

## 2013-05-19 VITALS — BP 165/82 | HR 77 | Resp 16

## 2013-05-19 DIAGNOSIS — L84 Corns and callosities: Secondary | ICD-10-CM

## 2013-05-19 NOTE — Progress Notes (Signed)
Subjective:     Patient ID: [name], female   DOB: [birth year], 79 y.o.   MRN: [id number]  HPI patient presents with caregiver stating I have painful corns   Review of Systems     Objective:   Physical Exam     Assessment:     Corn callus formation fifth and second toe right    Plan:     Debridement of corns no iatrogenic bleeding noted

## 2013-05-24 ENCOUNTER — Encounter: Payer: Medicare Other | Admitting: Podiatry

## 2013-06-28 ENCOUNTER — Ambulatory Visit: Payer: Medicare Other | Admitting: Diagnostic Neuroimaging

## 2013-09-10 DIAGNOSIS — F0390 Unspecified dementia without behavioral disturbance: Secondary | ICD-10-CM | POA: Diagnosis not present

## 2013-09-10 DIAGNOSIS — L821 Other seborrheic keratosis: Secondary | ICD-10-CM | POA: Diagnosis not present

## 2013-09-10 DIAGNOSIS — I10 Essential (primary) hypertension: Secondary | ICD-10-CM | POA: Diagnosis not present

## 2013-09-10 DIAGNOSIS — IMO0002 Reserved for concepts with insufficient information to code with codable children: Secondary | ICD-10-CM | POA: Diagnosis not present

## 2013-12-15 ENCOUNTER — Encounter (HOSPITAL_COMMUNITY): Payer: Self-pay | Admitting: Emergency Medicine

## 2013-12-15 ENCOUNTER — Emergency Department (HOSPITAL_COMMUNITY)
Admission: EM | Admit: 2013-12-15 | Discharge: 2013-12-15 | Disposition: A | Discharge status: Home / Self Care | Payer: Medicare Other | Attending: Emergency Medicine | Admitting: Emergency Medicine

## 2013-12-15 ENCOUNTER — Emergency Department (HOSPITAL_COMMUNITY): Payer: Medicare Other

## 2013-12-15 DIAGNOSIS — Y9389 Activity, other specified: Secondary | ICD-10-CM | POA: Insufficient documentation

## 2013-12-15 DIAGNOSIS — S46909A Unspecified injury of unspecified muscle, fascia and tendon at shoulder and upper arm level, unspecified arm, initial encounter: Secondary | ICD-10-CM | POA: Insufficient documentation

## 2013-12-15 DIAGNOSIS — S4980XA Other specified injuries of shoulder and upper arm, unspecified arm, initial encounter: Secondary | ICD-10-CM | POA: Insufficient documentation

## 2013-12-15 DIAGNOSIS — S6980XA Other specified injuries of unspecified wrist, hand and finger(s), initial encounter: Secondary | ICD-10-CM | POA: Diagnosis not present

## 2013-12-15 DIAGNOSIS — F0390 Unspecified dementia without behavioral disturbance: Secondary | ICD-10-CM | POA: Diagnosis not present

## 2013-12-15 DIAGNOSIS — S298XXA Other specified injuries of thorax, initial encounter: Secondary | ICD-10-CM | POA: Insufficient documentation

## 2013-12-15 DIAGNOSIS — M1990 Unspecified osteoarthritis, unspecified site: Secondary | ICD-10-CM | POA: Diagnosis not present

## 2013-12-15 DIAGNOSIS — M25559 Pain in unspecified hip: Secondary | ICD-10-CM | POA: Diagnosis not present

## 2013-12-15 DIAGNOSIS — S199XXA Unspecified injury of neck, initial encounter: Secondary | ICD-10-CM | POA: Diagnosis not present

## 2013-12-15 DIAGNOSIS — M7989 Other specified soft tissue disorders: Secondary | ICD-10-CM | POA: Diagnosis not present

## 2013-12-15 DIAGNOSIS — W010XXA Fall on same level from slipping, tripping and stumbling without subsequent striking against object, initial encounter: Secondary | ICD-10-CM | POA: Insufficient documentation

## 2013-12-15 DIAGNOSIS — S6990XA Unspecified injury of unspecified wrist, hand and finger(s), initial encounter: Secondary | ICD-10-CM | POA: Diagnosis not present

## 2013-12-15 DIAGNOSIS — W19XXXA Unspecified fall, initial encounter: Secondary | ICD-10-CM

## 2013-12-15 DIAGNOSIS — Y92009 Unspecified place in unspecified non-institutional (private) residence as the place of occurrence of the external cause: Secondary | ICD-10-CM | POA: Insufficient documentation

## 2013-12-15 DIAGNOSIS — Z79899 Other long term (current) drug therapy: Secondary | ICD-10-CM | POA: Diagnosis not present

## 2013-12-15 DIAGNOSIS — IMO0002 Reserved for concepts with insufficient information to code with codable children: Secondary | ICD-10-CM | POA: Insufficient documentation

## 2013-12-15 DIAGNOSIS — S0993XA Unspecified injury of face, initial encounter: Secondary | ICD-10-CM | POA: Insufficient documentation

## 2013-12-15 DIAGNOSIS — R079 Chest pain, unspecified: Secondary | ICD-10-CM | POA: Diagnosis not present

## 2013-12-15 DIAGNOSIS — S0990XA Unspecified injury of head, initial encounter: Secondary | ICD-10-CM | POA: Insufficient documentation

## 2013-12-15 DIAGNOSIS — M79609 Pain in unspecified limb: Secondary | ICD-10-CM | POA: Diagnosis not present

## 2013-12-15 NOTE — ED Provider Notes (Signed)
CSN: [id number]     Arrival date & time 12/15/13  1630 History  This chart was scribed for Carmin Muskrat, MD by Rolanda Lundborg, ED Scribe. This patient was seen in room APA14/APA14 and the patient's care was started at 4:40 PM.   Chief Complaint  Patient presents with  . Fall   The history is provided by the patient. No language interpreter was used.   HPI Comments: [name] is a 80 y.o. female who presents to the Emergency Department complaining of an unwitnessed fall that occurred this afternoon in the pt's home. Pt states she missed the step up into her kitchen and slid across the floor. She states she hit her head on the threshold but did not lose consciousness. She is complaining of chest soreness from landing on her chest. She reports abrasions to the right forearm and to the left little finger. She also reports right-sided neck pain. She denies vision changes, difficulty speaking, or weakness. She states she had been having a normal day and went out to lunch with friends before the fall.    Past Medical History  Diagnosis Date  . Dementia   . Osteoarthritis    Past Surgical History  Procedure Laterality Date  . Breast surgery    . Eye surgery     No family history on file. History  Substance Use Topics  . Smoking status: Never Smoker   . Smokeless tobacco: Not on file  . Alcohol Use: No     Comment: Patient drinks coffee daily.   OB History   Grav Para Term Preterm Abortions TAB SAB Ect Mult Living                 Review of Systems  Constitutional:       Per HPI, otherwise negative  HENT:       Per HPI, otherwise negative  Respiratory:       Per HPI, otherwise negative  Cardiovascular:       Per HPI, otherwise negative  Gastrointestinal: Negative for vomiting.  Endocrine:       Negative aside from HPI  Genitourinary:       Neg aside from HPI   Musculoskeletal:       Per HPI, otherwise negative  Skin: Negative.   Neurological: Negative for syncope.       Allergies  Review of patient's allergies indicates no known allergies.  Home Medications   Prior to Admission medications   Medication Sig Start Date End Date Taking? Authorizing Provider  citalopram (CELEXA) 20 MG tablet Take 20 mg by mouth daily.    Historical Provider, MD  Multiple Vitamins-Minerals (MULTIVITAMINS THER. W/MINERALS) TABS Take 1 tablet by mouth daily.    Historical Provider, MD  polyethylene glycol powder (GLYCOLAX/MIRALAX) powder  09/22/12   Historical Provider, MD  PRESCRIPTION MEDICATION Take 1 tablet by mouth daily. Patient takes medication for nerves unsure of name and strength    Historical Provider, MD  raloxifene (EVISTA) 60 MG tablet Take 60 mg by mouth daily.      Historical Provider, MD   BP 152/60  Pulse 83  Temp(Src) 97.4 F (36.3 C) (Oral)  Resp 16  SpO2 97% Physical Exam  Nursing note and vitals reviewed. Constitutional: She is oriented to person, place, and time. She appears well-developed and well-nourished. No distress.  HENT:  Head: Normocephalic and atraumatic.  Eyes: Conjunctivae and EOM are normal.  Cardiovascular: Normal rate, regular rhythm and intact distal pulses.   Pulmonary/Chest:  Effort normal and breath sounds normal. No stridor. No respiratory distress. She exhibits tenderness (mild to sternum).  Abdominal: She exhibits no distension.  Musculoskeletal: She exhibits no edema.  Left fifth finger - Avulsion of the skin on the proximal pharyngeal point on the dorsum. good flexion and extension of all digits on the left hand dorsum of right forearm - v-shaped skin tear 10cm in length Good and equal strength in BLE 5/5. good ROM  Neurological: She is alert and oriented to person, place, and time. No cranial nerve deficit.  Skin: Skin is warm and dry.  Psychiatric: She has a normal mood and affect.    ED Course  Procedures (including critical care time) Medications - No data to display  DIAGNOSTIC STUDIES: Oxygen Saturation  is 97% on RA, normal by my interpretation.    COORDINATION OF CARE: 4:51 PM- Discussed treatment plan with pt. Pt agrees to plan.    Labs Review Labs Reviewed - No data to display  Imaging Review Dg Chest 2 View  12/15/2013   CLINICAL DATA:  Recent traumatic injury with pain  EXAM: CHEST  2 VIEW  COMPARISON:  07/28/2012  FINDINGS: Cardiac shadow is stable. The lungs are clear bilaterally. Old rib fractures are noted on the right with healing. No acute infiltrate or effusion is seen.  IMPRESSION: No active cardiopulmonary disease.   Electronically Signed   By: Inez Catalina M.D.   On: 12/15/2013 19:10   Dg Pelvis 1-2 Views  12/15/2013   CLINICAL DATA:  Recent traumatic injury with pain  EXAM: PELVIS - 1-2 VIEW  COMPARISON:  None.  FINDINGS: The pelvic ring is intact. No acute fracture is seen. A calcified uterine fibroid is noted  IMPRESSION: No acute abnormality noted.   Electronically Signed   By: Inez Catalina M.D.   On: 12/15/2013 19:11   Ct Head Wo Contrast  12/15/2013   CLINICAL DATA:  Fall.  EXAM: CT HEAD WITHOUT CONTRAST  CT CERVICAL SPINE WITHOUT CONTRAST  TECHNIQUE: Multidetector CT imaging of the head and cervical spine was performed following the standard protocol without intravenous contrast. Multiplanar CT image reconstructions of the cervical spine were also generated.  COMPARISON:  Head CT 07/28/2012  FINDINGS: CT HEAD FINDINGS  Ventricles, cisterns and other CSF spaces are within normal. There is mild chronic ischemic microvascular disease present. There is no mass, mass effect, shift of midline structures or acute hemorrhage. There is no evidence of acute infarction. Remaining bones and soft tissues are within normal.  CT CERVICAL SPINE FINDINGS  Vertebral body alignment and heights are normal. There is mild spondylosis throughout the cervical spine. There is disc space narrowing at the C3-4, C4-5, C5-6 and C6-7 levels. Prevertebral soft tissues are within normal. There is mild  uncovertebral joint spurring and facet arthropathy. The atlantoaxial articulation is within normal. There is no acute fracture or subluxation. There is fusion of the right C3 and C4 facet joints. Remainder of the exam is unremarkable.  IMPRESSION: No acute intracranial findings.  No acute cervical spine injury.  Minimal chronic ischemic microvascular disease.  Mild spondylosis of the cervical spine with multilevel degenerative disc disease. Fusion of the right C3/C4 facet joints.   Electronically Signed   By: Marin Olp M.D.   On: 12/15/2013 19:54   Ct Cervical Spine Wo Contrast  12/15/2013   CLINICAL DATA:  Fall.  EXAM: CT HEAD WITHOUT CONTRAST  CT CERVICAL SPINE WITHOUT CONTRAST  TECHNIQUE: Multidetector CT imaging of the head and cervical  spine was performed following the standard protocol without intravenous contrast. Multiplanar CT image reconstructions of the cervical spine were also generated.  COMPARISON:  Head CT 07/28/2012  FINDINGS: CT HEAD FINDINGS  Ventricles, cisterns and other CSF spaces are within normal. There is mild chronic ischemic microvascular disease present. There is no mass, mass effect, shift of midline structures or acute hemorrhage. There is no evidence of acute infarction. Remaining bones and soft tissues are within normal.  CT CERVICAL SPINE FINDINGS  Vertebral body alignment and heights are normal. There is mild spondylosis throughout the cervical spine. There is disc space narrowing at the C3-4, C4-5, C5-6 and C6-7 levels. Prevertebral soft tissues are within normal. There is mild uncovertebral joint spurring and facet arthropathy. The atlantoaxial articulation is within normal. There is no acute fracture or subluxation. There is fusion of the right C3 and C4 facet joints. Remainder of the exam is unremarkable.  IMPRESSION: No acute intracranial findings.  No acute cervical spine injury.  Minimal chronic ischemic microvascular disease.  Mild spondylosis of the cervical spine  with multilevel degenerative disc disease. Fusion of the right C3/C4 facet joints.   Electronically Signed   By: Marin Olp M.D.   On: 12/15/2013 19:54   Dg Finger Little Left  12/15/2013   CLINICAL DATA:  Traumatic injury and pain  EXAM: LEFT LITTLE FINGER 2+V  COMPARISON:  None.  FINDINGS: Soft tissue swelling is noted. Mild degenerative changes in the interphalangeal joints are seen. No acute fracture or dislocation is noted.  IMPRESSION: Soft tissue changes without acute bony abnormality.   Electronically Signed   By: Inez Catalina M.D.   On: 12/15/2013 19:09   8:29 PM Exam the patient is in no distress.  She is sitting upright, interacting appropriately, with no new complaints.   MDM   This patient presents after a fall for multiple areas of pain, superficial abrasions and skin care. Patient is awake, alert, neurologically intact, hemodynamically stable.  Patient's evaluation here was largely reassuring, with no new fractures, and no decompensation while here. Patient was discharged in stable condition after her wounds were dressed, and we had a lengthy conversation about wound care and analgesia use.   Carmin Muskrat, MD 12/15/13 2029

## 2013-12-15 NOTE — ED Notes (Signed)
Pt tripped over threshold and slid across room. Complain of pain  Skin tear to right forearm and injury to left little finger. Pt states she landed on her chest. Complain of pain across chest from fall

## 2013-12-15 NOTE — Discharge Instructions (Signed)
As discussed, please monitor your condition carefully.  Your wounds have been dressed, and you should leave the dressing in place until Saturday.  At that time he may remove the dressing, but the areas, and applied new, fresh dressing to all of your wounds  Return here for any concerning changes in your condition.

## 2013-12-15 NOTE — ED Notes (Signed)
Placed ice pack to right arm and left pinky finger.

## 2013-12-15 NOTE — ED Notes (Signed)
Pulled skin back over wound on right forearm and covered with tegaderm. Wrapped with sterile gauze. Covered left pinky finger with tegaderm and sterile gauze. Patient tolerated well.

## 2013-12-21 DIAGNOSIS — R29818 Other symptoms and signs involving the nervous system: Secondary | ICD-10-CM | POA: Diagnosis not present

## 2013-12-21 DIAGNOSIS — T148XXA Other injury of unspecified body region, initial encounter: Secondary | ICD-10-CM | POA: Diagnosis not present

## 2013-12-21 DIAGNOSIS — IMO0002 Reserved for concepts with insufficient information to code with codable children: Secondary | ICD-10-CM | POA: Diagnosis not present

## 2014-01-01 ENCOUNTER — Encounter (HOSPITAL_COMMUNITY)
Admission: EM | Disposition: A | Discharge status: Skilled Nursing Facility | Payer: Self-pay | Source: Home / Self Care | Attending: Internal Medicine

## 2014-01-01 ENCOUNTER — Inpatient Hospital Stay (HOSPITAL_COMMUNITY): Payer: Medicare Other

## 2014-01-01 ENCOUNTER — Encounter (HOSPITAL_COMMUNITY): Payer: Self-pay | Admitting: Emergency Medicine

## 2014-01-01 ENCOUNTER — Encounter (HOSPITAL_COMMUNITY): Payer: Medicare Other | Admitting: Anesthesiology

## 2014-01-01 ENCOUNTER — Emergency Department (HOSPITAL_COMMUNITY): Payer: Medicare Other

## 2014-01-01 ENCOUNTER — Inpatient Hospital Stay (HOSPITAL_COMMUNITY): Payer: Medicare Other | Admitting: Anesthesiology

## 2014-01-01 ENCOUNTER — Inpatient Hospital Stay (HOSPITAL_COMMUNITY)
Admission: EM | Admit: 2014-01-01 | Discharge: 2014-01-06 | DRG: 470 | Disposition: A | Discharge status: Skilled Nursing Facility | Payer: Medicare Other | Attending: Internal Medicine | Admitting: Internal Medicine

## 2014-01-01 DIAGNOSIS — R1310 Dysphagia, unspecified: Secondary | ICD-10-CM | POA: Diagnosis not present

## 2014-01-01 DIAGNOSIS — D696 Thrombocytopenia, unspecified: Secondary | ICD-10-CM | POA: Diagnosis present

## 2014-01-01 DIAGNOSIS — Z471 Aftercare following joint replacement surgery: Secondary | ICD-10-CM | POA: Diagnosis not present

## 2014-01-01 DIAGNOSIS — F329 Major depressive disorder, single episode, unspecified: Secondary | ICD-10-CM

## 2014-01-01 DIAGNOSIS — F32A Depression, unspecified: Secondary | ICD-10-CM

## 2014-01-01 DIAGNOSIS — R279 Unspecified lack of coordination: Secondary | ICD-10-CM | POA: Diagnosis not present

## 2014-01-01 DIAGNOSIS — W010XXA Fall on same level from slipping, tripping and stumbling without subsequent striking against object, initial encounter: Secondary | ICD-10-CM | POA: Diagnosis present

## 2014-01-01 DIAGNOSIS — S72033A Displaced midcervical fracture of unspecified femur, initial encounter for closed fracture: Secondary | ICD-10-CM | POA: Diagnosis not present

## 2014-01-01 DIAGNOSIS — I498 Other specified cardiac arrhythmias: Secondary | ICD-10-CM | POA: Diagnosis not present

## 2014-01-01 DIAGNOSIS — S0100XA Unspecified open wound of scalp, initial encounter: Secondary | ICD-10-CM | POA: Diagnosis not present

## 2014-01-01 DIAGNOSIS — D72829 Elevated white blood cell count, unspecified: Secondary | ICD-10-CM | POA: Diagnosis not present

## 2014-01-01 DIAGNOSIS — F0390 Unspecified dementia without behavioral disturbance: Secondary | ICD-10-CM | POA: Diagnosis not present

## 2014-01-01 DIAGNOSIS — M25559 Pain in unspecified hip: Secondary | ICD-10-CM | POA: Diagnosis not present

## 2014-01-01 DIAGNOSIS — M169 Osteoarthritis of hip, unspecified: Secondary | ICD-10-CM | POA: Diagnosis present

## 2014-01-01 DIAGNOSIS — M1610 Unilateral primary osteoarthritis, unspecified hip: Secondary | ICD-10-CM | POA: Diagnosis present

## 2014-01-01 DIAGNOSIS — S199XXA Unspecified injury of neck, initial encounter: Secondary | ICD-10-CM | POA: Diagnosis not present

## 2014-01-01 DIAGNOSIS — N390 Urinary tract infection, site not specified: Secondary | ICD-10-CM | POA: Diagnosis not present

## 2014-01-01 DIAGNOSIS — Y92009 Unspecified place in unspecified non-institutional (private) residence as the place of occurrence of the external cause: Secondary | ICD-10-CM

## 2014-01-01 DIAGNOSIS — M129 Arthropathy, unspecified: Secondary | ICD-10-CM | POA: Diagnosis not present

## 2014-01-01 DIAGNOSIS — D62 Acute posthemorrhagic anemia: Secondary | ICD-10-CM | POA: Diagnosis not present

## 2014-01-01 DIAGNOSIS — Z96649 Presence of unspecified artificial hip joint: Secondary | ICD-10-CM | POA: Diagnosis not present

## 2014-01-01 DIAGNOSIS — J90 Pleural effusion, not elsewhere classified: Secondary | ICD-10-CM | POA: Diagnosis not present

## 2014-01-01 DIAGNOSIS — F3289 Other specified depressive episodes: Secondary | ICD-10-CM | POA: Diagnosis not present

## 2014-01-01 DIAGNOSIS — R262 Difficulty in walking, not elsewhere classified: Secondary | ICD-10-CM | POA: Diagnosis not present

## 2014-01-01 DIAGNOSIS — I10 Essential (primary) hypertension: Secondary | ICD-10-CM | POA: Diagnosis present

## 2014-01-01 DIAGNOSIS — J438 Other emphysema: Secondary | ICD-10-CM | POA: Diagnosis not present

## 2014-01-01 DIAGNOSIS — S0993XA Unspecified injury of face, initial encounter: Secondary | ICD-10-CM | POA: Diagnosis not present

## 2014-01-01 DIAGNOSIS — S72001A Fracture of unspecified part of neck of right femur, initial encounter for closed fracture: Secondary | ICD-10-CM | POA: Diagnosis present

## 2014-01-01 DIAGNOSIS — S72009A Fracture of unspecified part of neck of unspecified femur, initial encounter for closed fracture: Secondary | ICD-10-CM

## 2014-01-01 DIAGNOSIS — M6281 Muscle weakness (generalized): Secondary | ICD-10-CM | POA: Diagnosis not present

## 2014-01-01 DIAGNOSIS — R000 Tachycardia, unspecified: Secondary | ICD-10-CM | POA: Diagnosis not present

## 2014-01-01 DIAGNOSIS — J9819 Other pulmonary collapse: Secondary | ICD-10-CM | POA: Diagnosis not present

## 2014-01-01 DIAGNOSIS — Z9181 History of falling: Secondary | ICD-10-CM | POA: Diagnosis not present

## 2014-01-01 DIAGNOSIS — S0990XA Unspecified injury of head, initial encounter: Secondary | ICD-10-CM | POA: Diagnosis not present

## 2014-01-01 DIAGNOSIS — S72009D Fracture of unspecified part of neck of unspecified femur, subsequent encounter for closed fracture with routine healing: Secondary | ICD-10-CM | POA: Diagnosis not present

## 2014-01-01 DIAGNOSIS — Z23 Encounter for immunization: Secondary | ICD-10-CM

## 2014-01-01 DIAGNOSIS — D649 Anemia, unspecified: Secondary | ICD-10-CM | POA: Diagnosis not present

## 2014-01-01 DIAGNOSIS — R489 Unspecified symbolic dysfunctions: Secondary | ICD-10-CM | POA: Diagnosis not present

## 2014-01-01 DIAGNOSIS — W19XXXA Unspecified fall, initial encounter: Secondary | ICD-10-CM

## 2014-01-01 DIAGNOSIS — R41841 Cognitive communication deficit: Secondary | ICD-10-CM | POA: Diagnosis not present

## 2014-01-01 DIAGNOSIS — R079 Chest pain, unspecified: Secondary | ICD-10-CM | POA: Diagnosis not present

## 2014-01-01 HISTORY — PX: HIP ARTHROPLASTY: SHX981

## 2014-01-01 LAB — ABO/RH: ABO/RH(D): A POS

## 2014-01-01 LAB — BASIC METABOLIC PANEL
BUN: 15 mg/dL (ref 6–23)
CALCIUM: 8.8 mg/dL (ref 8.4–10.5)
CO2: 28 mEq/L (ref 19–32)
CREATININE: 0.74 mg/dL (ref 0.50–1.10)
Chloride: 104 mEq/L (ref 96–112)
GFR, EST AFRICAN AMERICAN: 87 mL/min — AB (ref >90)
GFR, EST NON AFRICAN AMERICAN: 75 mL/min — AB (ref >90)
Glucose, Bld: 112 mg/dL — ABNORMAL HIGH (ref 70–99)
Potassium: 3.9 mEq/L (ref 3.7–5.3)
Sodium: 142 mEq/L (ref 137–147)

## 2014-01-01 LAB — CBC WITH DIFFERENTIAL/PLATELET
BASOS ABS: 0 10*3/uL (ref 0.0–0.1)
BASOS PCT: 1 % (ref 0–1)
EOS ABS: 0 10*3/uL (ref 0.0–0.7)
EOS PCT: 1 % (ref 0–5)
HEMATOCRIT: 33.9 % — AB (ref 36.0–46.0)
HEMOGLOBIN: 11.2 g/dL — AB (ref 12.0–15.0)
Lymphocytes Relative: 16 % (ref 12–46)
Lymphs Abs: 0.7 10*3/uL (ref 0.7–4.0)
MCH: 31.5 pg (ref 26.0–34.0)
MCHC: 33 g/dL (ref 30.0–36.0)
MCV: 95.5 fL (ref 78.0–100.0)
MONO ABS: 0.8 10*3/uL (ref 0.1–1.0)
MONOS PCT: 18 % — AB (ref 3–12)
Neutro Abs: 2.9 10*3/uL (ref 1.7–7.7)
Neutrophils Relative %: 66 % (ref 43–77)
Platelets: 148 10*3/uL — ABNORMAL LOW (ref 150–400)
RBC: 3.55 MIL/uL — ABNORMAL LOW (ref 3.87–5.11)
RDW: 12.9 % (ref 11.5–15.5)
WBC: 4.4 10*3/uL (ref 4.0–10.5)

## 2014-01-01 LAB — URINALYSIS, ROUTINE W REFLEX MICROSCOPIC
Bilirubin Urine: NEGATIVE
Glucose, UA: NEGATIVE mg/dL
Hgb urine dipstick: NEGATIVE
KETONES UR: NEGATIVE mg/dL
LEUKOCYTES UA: NEGATIVE
NITRITE: NEGATIVE
PROTEIN: NEGATIVE mg/dL
Specific Gravity, Urine: 1.015 (ref 1.005–1.030)
Urobilinogen, UA: 0.2 mg/dL (ref 0.0–1.0)
pH: 7 (ref 5.0–8.0)

## 2014-01-01 LAB — SURGICAL PCR SCREEN
MRSA, PCR: NEGATIVE
Staphylococcus aureus: NEGATIVE

## 2014-01-01 LAB — PREPARE RBC (CROSSMATCH)

## 2014-01-01 SURGERY — HEMIARTHROPLASTY, HIP, DIRECT ANTERIOR APPROACH, FOR FRACTURE
Anesthesia: Spinal | Site: Hip | Laterality: Right

## 2014-01-01 MED ORDER — BUPIVACAINE IN DEXTROSE 0.75-8.25 % IT SOLN
INTRATHECAL | Status: DC | PRN
  Administered 2014-01-01: 13 mg via INTRATHECAL

## 2014-01-01 MED ORDER — MIDAZOLAM HCL 2 MG/2ML IJ SOLN
INTRAMUSCULAR | Status: AC
  Filled 2014-01-01: qty 2

## 2014-01-01 MED ORDER — SODIUM CHLORIDE 0.9 % IR SOLN
Status: DC | PRN
  Administered 2014-01-01: 1000 mL

## 2014-01-01 MED ORDER — PROPOFOL INFUSION 10 MG/ML OPTIME
INTRAVENOUS | Status: DC | PRN
  Administered 2014-01-01: 75 ug/kg/min via INTRAVENOUS

## 2014-01-01 MED ORDER — TRAMADOL HCL 50 MG PO TABS
50.0000 mg | ORAL_TABLET | Freq: Four times a day (QID) | ORAL | Status: DC
  Administered 2014-01-01 – 2014-01-06 (×19): 50 mg via ORAL
  Filled 2014-01-01 (×20): qty 1

## 2014-01-01 MED ORDER — BUPIVACAINE-EPINEPHRINE (PF) 0.5% -1:200000 IJ SOLN
INTRAMUSCULAR | Status: AC
  Filled 2014-01-01: qty 60

## 2014-01-01 MED ORDER — BUPIVACAINE IN DEXTROSE 0.75-8.25 % IT SOLN
INTRATHECAL | Status: AC
  Filled 2014-01-01: qty 2

## 2014-01-01 MED ORDER — CEFAZOLIN SODIUM-DEXTROSE 2-3 GM-% IV SOLR: 2.0000 g | INTRAVENOUS | Status: DC

## 2014-01-01 MED ORDER — FLEET ENEMA 7-19 GM/118ML RE ENEM: 1.0000 | ENEMA | Freq: Once | RECTAL | Status: AC | PRN

## 2014-01-01 MED ORDER — SILVER SULFADIAZINE 1 % EX CREA
1.0000 "application " | TOPICAL_CREAM | Freq: Every day | CUTANEOUS | Status: DC
  Administered 2014-01-02 – 2014-01-06 (×5): 1 via TOPICAL
  Filled 2014-01-01: qty 85

## 2014-01-01 MED ORDER — FENTANYL CITRATE 0.05 MG/ML IJ SOLN
50.0000 ug | Freq: Once | INTRAMUSCULAR | Status: AC
  Administered 2014-01-01: 50 ug via INTRAVENOUS
  Filled 2014-01-01: qty 2

## 2014-01-01 MED ORDER — METOCLOPRAMIDE HCL 5 MG/ML IJ SOLN: 5.0000 mg | Freq: Three times a day (TID) | INTRAMUSCULAR | Status: DC | PRN

## 2014-01-01 MED ORDER — MIDAZOLAM HCL 5 MG/5ML IJ SOLN
INTRAMUSCULAR | Status: DC | PRN
  Administered 2014-01-01 (×2): 0.5 mg via INTRAVENOUS

## 2014-01-01 MED ORDER — POVIDONE-IODINE 10 % EX SOLN
CUTANEOUS | Status: AC
  Filled 2014-01-01: qty 118

## 2014-01-01 MED ORDER — SODIUM CHLORIDE 0.9 % IV SOLN
INTRAVENOUS | Status: AC
  Administered 2014-01-01: 06:00:00 via INTRAVENOUS

## 2014-01-01 MED ORDER — PHENYLEPHRINE HCL 10 MG/ML IJ SOLN
INTRAMUSCULAR | Status: DC | PRN
  Administered 2014-01-01: 100 ug via INTRAVENOUS

## 2014-01-01 MED ORDER — CEFAZOLIN SODIUM-DEXTROSE 2-3 GM-% IV SOLR
2.0000 g | Freq: Four times a day (QID) | INTRAVENOUS | Status: AC
  Administered 2014-01-01 – 2014-01-02 (×2): 2 g via INTRAVENOUS
  Filled 2014-01-01 (×2): qty 50

## 2014-01-01 MED ORDER — ACETAMINOPHEN 10 MG/ML IV SOLN
1000.0000 mg | Freq: Once | INTRAVENOUS | Status: AC
  Administered 2014-01-01: 1000 mg via INTRAVENOUS

## 2014-01-01 MED ORDER — PNEUMOCOCCAL VAC POLYVALENT 25 MCG/0.5ML IJ INJ
0.5000 mL | INJECTION | INTRAMUSCULAR | Status: AC
  Administered 2014-01-02: 0.5 mL via INTRAMUSCULAR
  Filled 2014-01-01: qty 0.5

## 2014-01-01 MED ORDER — FENTANYL CITRATE 0.05 MG/ML IJ SOLN
INTRAMUSCULAR | Status: AC
  Filled 2014-01-01: qty 2

## 2014-01-01 MED ORDER — ACETAMINOPHEN 10 MG/ML IV SOLN
1000.0000 mg | Freq: Four times a day (QID) | INTRAVENOUS | Status: DC
  Administered 2014-01-01 – 2014-01-02 (×2): 1000 mg via INTRAVENOUS
  Filled 2014-01-01 (×3): qty 100

## 2014-01-01 MED ORDER — SUCCINYLCHOLINE CHLORIDE 20 MG/ML IJ SOLN
INTRAMUSCULAR | Status: AC
  Filled 2014-01-01: qty 2

## 2014-01-01 MED ORDER — CHLORHEXIDINE GLUCONATE 4 % EX LIQD
60.0000 mL | Freq: Once | CUTANEOUS | Status: AC
  Administered 2014-01-01: 4 via TOPICAL
  Filled 2014-01-01: qty 60

## 2014-01-01 MED ORDER — SODIUM CHLORIDE 0.9 % IV SOLN
INTRAVENOUS | Status: DC
  Administered 2014-01-01 – 2014-01-02 (×2): via INTRAVENOUS

## 2014-01-01 MED ORDER — CEFAZOLIN SODIUM-DEXTROSE 2-3 GM-% IV SOLR
2.0000 g | INTRAVENOUS | Status: DC
Start: 2014-01-01 — End: 2014-01-01

## 2014-01-01 MED ORDER — ONDANSETRON HCL 4 MG/2ML IJ SOLN
4.0000 mg | Freq: Three times a day (TID) | INTRAMUSCULAR | Status: DC | PRN
  Administered 2014-01-01: 4 mg via INTRAVENOUS
  Filled 2014-01-01: qty 2

## 2014-01-01 MED ORDER — HEPARIN SODIUM (PORCINE) 5000 UNIT/ML IJ SOLN: 5000.0000 [IU] | Freq: Three times a day (TID) | INTRAMUSCULAR | Status: DC

## 2014-01-01 MED ORDER — ATROPINE SULFATE 0.4 MG/ML IJ SOLN
INTRAMUSCULAR | Status: AC
  Filled 2014-01-01: qty 1

## 2014-01-01 MED ORDER — PROPOFOL 10 MG/ML IV EMUL
INTRAVENOUS | Status: AC
  Filled 2014-01-01: qty 20

## 2014-01-01 MED ORDER — MENTHOL 3 MG MT LOZG: 1.0000 | LOZENGE | OROMUCOSAL | Status: DC | PRN

## 2014-01-01 MED ORDER — BUPIVACAINE-EPINEPHRINE (PF) 0.5% -1:200000 IJ SOLN
INTRAMUSCULAR | Status: DC | PRN
  Administered 2014-01-01: 60 mL

## 2014-01-01 MED ORDER — HYDROCODONE-ACETAMINOPHEN 5-325 MG PO TABS: 1.0000 | ORAL_TABLET | Freq: Four times a day (QID) | ORAL | Status: DC | PRN

## 2014-01-01 MED ORDER — ALUM & MAG HYDROXIDE-SIMETH 200-200-20 MG/5ML PO SUSP: 30.0000 mL | ORAL | Status: DC | PRN

## 2014-01-01 MED ORDER — SODIUM CHLORIDE 0.9 % IV SOLN
INTRAVENOUS | Status: DC | PRN
  Administered 2014-01-01: 14:00:00 via INTRAVENOUS

## 2014-01-01 MED ORDER — ONDANSETRON HCL 4 MG PO TABS: 4.0000 mg | ORAL_TABLET | Freq: Four times a day (QID) | ORAL | Status: DC | PRN

## 2014-01-01 MED ORDER — FENTANYL CITRATE 0.05 MG/ML IJ SOLN
15.0000 ug | Freq: Once | INTRAMUSCULAR | Status: DC
Start: 2014-01-01 — End: 2014-01-01

## 2014-01-01 MED ORDER — SODIUM CHLORIDE 0.9 % IV SOLN
Freq: Once | INTRAVENOUS | Status: AC
  Administered 2014-01-01: 06:00:00 via INTRAVENOUS

## 2014-01-01 MED ORDER — MORPHINE SULFATE 2 MG/ML IJ SOLN: 2.0000 mg | INTRAMUSCULAR | Status: DC | PRN

## 2014-01-01 MED ORDER — FENTANYL CITRATE 0.05 MG/ML IJ SOLN
50.0000 ug | INTRAMUSCULAR | Status: DC | PRN
  Administered 2014-01-01: 50 ug via INTRAVENOUS
  Filled 2014-01-01: qty 2

## 2014-01-01 MED ORDER — FENTANYL CITRATE 0.05 MG/ML IJ SOLN
INTRAMUSCULAR | Status: DC | PRN
  Administered 2014-01-01 (×2): 25 ug via INTRAVENOUS
  Administered 2014-01-01: 25 ug via INTRATHECAL
  Administered 2014-01-01 (×2): 25 ug via INTRAVENOUS

## 2014-01-01 MED ORDER — CEFAZOLIN SODIUM-DEXTROSE 2-3 GM-% IV SOLR
INTRAVENOUS | Status: DC | PRN
  Administered 2014-01-01: 2 g via INTRAVENOUS

## 2014-01-01 MED ORDER — ENOXAPARIN SODIUM 40 MG/0.4ML ~~LOC~~ SOLN
40.0000 mg | SUBCUTANEOUS | Status: DC
  Administered 2014-01-03 – 2014-01-06 (×3): 40 mg via SUBCUTANEOUS
  Filled 2014-01-01 (×3): qty 0.4

## 2014-01-01 MED ORDER — ACETAMINOPHEN 10 MG/ML IV SOLN: 1000.0000 mg | Freq: Four times a day (QID) | INTRAVENOUS | Status: DC

## 2014-01-01 MED ORDER — POLYETHYLENE GLYCOL 3350 17 G PO PACK
17.0000 g | PACK | Freq: Every day | ORAL | Status: DC
  Administered 2014-01-02 – 2014-01-06 (×5): 17 g via ORAL
  Filled 2014-01-01 (×5): qty 1

## 2014-01-01 MED ORDER — LACTATED RINGERS IV SOLN
INTRAVENOUS | Status: DC | PRN
  Administered 2014-01-01: 15:00:00 via INTRAVENOUS

## 2014-01-01 MED ORDER — LIDOCAINE HCL (CARDIAC) 10 MG/ML IV SOLN
INTRAVENOUS | Status: DC | PRN
  Administered 2014-01-01: 25 mg via INTRAVENOUS

## 2014-01-01 MED ORDER — EPHEDRINE SULFATE 50 MG/ML IJ SOLN
INTRAMUSCULAR | Status: AC
  Filled 2014-01-01: qty 1

## 2014-01-01 MED ORDER — HYDROMORPHONE HCL PF 1 MG/ML IJ SOLN
0.5000 mg | INTRAMUSCULAR | Status: DC | PRN
  Administered 2014-01-01: 0.5 mg via INTRAVENOUS
  Filled 2014-01-01: qty 1

## 2014-01-01 MED ORDER — CITALOPRAM HYDROBROMIDE 20 MG PO TABS
20.0000 mg | ORAL_TABLET | Freq: Every day | ORAL | Status: DC
  Administered 2014-01-02 – 2014-01-06 (×5): 20 mg via ORAL
  Filled 2014-01-01 (×5): qty 1

## 2014-01-01 MED ORDER — SODIUM CHLORIDE BACTERIOSTATIC 0.9 % IJ SOLN
INTRAMUSCULAR | Status: AC
  Filled 2014-01-01: qty 10

## 2014-01-01 MED ORDER — LIDOCAINE HCL (PF) 1 % IJ SOLN
INTRAMUSCULAR | Status: AC
  Filled 2014-01-01: qty 5

## 2014-01-01 MED ORDER — MORPHINE SULFATE 2 MG/ML IJ SOLN
0.5000 mg | INTRAMUSCULAR | Status: DC | PRN
  Administered 2014-01-01 (×2): 0.5 mg via INTRAVENOUS
  Filled 2014-01-01 (×2): qty 1

## 2014-01-01 MED ORDER — METOCLOPRAMIDE HCL 10 MG PO TABS: 5.0000 mg | ORAL_TABLET | Freq: Three times a day (TID) | ORAL | Status: DC | PRN

## 2014-01-01 MED ORDER — CEFAZOLIN SODIUM-DEXTROSE 2-3 GM-% IV SOLR
INTRAVENOUS | Status: AC
  Filled 2014-01-01: qty 100

## 2014-01-01 MED ORDER — ACETAMINOPHEN 10 MG/ML IV SOLN
INTRAVENOUS | Status: AC
  Filled 2014-01-01: qty 100

## 2014-01-01 MED ORDER — TETANUS-DIPHTH-ACELL PERTUSSIS 5-2.5-18.5 LF-MCG/0.5 IM SUSP
0.5000 mL | Freq: Once | INTRAMUSCULAR | Status: AC
  Administered 2014-01-01: 0.5 mL via INTRAMUSCULAR
  Filled 2014-01-01: qty 0.5

## 2014-01-01 MED ORDER — PROPOFOL 10 MG/ML IV BOLUS
INTRAVENOUS | Status: DC | PRN
  Administered 2014-01-01: 25 mg via INTRAVENOUS

## 2014-01-01 MED ORDER — HYPROMELLOSE (GONIOSCOPIC) 2.5 % OP SOLN
1.0000 [drp] | OPHTHALMIC | Status: DC | PRN
  Filled 2014-01-01: qty 15

## 2014-01-01 MED ORDER — PHENYLEPHRINE HCL 10 MG/ML IJ SOLN
INTRAMUSCULAR | Status: AC
  Filled 2014-01-01: qty 1

## 2014-01-01 MED ORDER — BISACODYL 10 MG RE SUPP
10.0000 mg | Freq: Every day | RECTAL | Status: DC | PRN
  Administered 2014-01-05: 10 mg via RECTAL
  Filled 2014-01-01: qty 1

## 2014-01-01 MED ORDER — DIPHENHYDRAMINE HCL 50 MG/ML IJ SOLN
INTRAMUSCULAR | Status: AC
  Filled 2014-01-01: qty 1

## 2014-01-01 MED ORDER — CEFAZOLIN SODIUM-DEXTROSE 2-3 GM-% IV SOLR
INTRAVENOUS | Status: AC
Start: 2014-01-01 — End: 2014-01-01
  Filled 2014-01-01: qty 50

## 2014-01-01 MED ORDER — ONDANSETRON HCL 4 MG/2ML IJ SOLN: 4.0000 mg | Freq: Four times a day (QID) | INTRAMUSCULAR | Status: DC | PRN

## 2014-01-01 MED ORDER — PHENOL 1.4 % MT LIQD: 1.0000 | OROMUCOSAL | Status: DC | PRN

## 2014-01-01 MED ORDER — SUCCINYLCHOLINE CHLORIDE 20 MG/ML IJ SOLN
INTRAMUSCULAR | Status: AC
  Filled 2014-01-01: qty 1

## 2014-01-01 MED ORDER — ACETAMINOPHEN 10 MG/ML IV SOLN
INTRAVENOUS | Status: AC
  Filled 2014-01-01: qty 200

## 2014-01-01 MED ORDER — ONDANSETRON HCL 4 MG/2ML IJ SOLN
INTRAMUSCULAR | Status: DC | PRN
  Administered 2014-01-01: 4 mg via INTRAVENOUS

## 2014-01-01 SURGICAL SUPPLY — 63 items
BAG HAMPER (MISCELLANEOUS) ×2 IMPLANT
BIT DRILL 2.8X128 (BIT) ×2 IMPLANT
BLADE 10 SAFETY STRL DISP (BLADE) IMPLANT
BLADE HEX COATED 2.75 (ELECTRODE) ×2 IMPLANT
BLADE SAGITTAL 25.0X1.27X90 (BLADE) ×2 IMPLANT
BLADE SURG SZ10 CARB STEEL (BLADE) ×2 IMPLANT
BRUSH FEMORAL CANAL (MISCELLANEOUS) IMPLANT
CAPT HIP FX BIPOLAR/UNIPOLAR ×2 IMPLANT
CHLORAPREP W/TINT 26ML (MISCELLANEOUS) ×2 IMPLANT
CLOTH BEACON ORANGE TIMEOUT ST (SAFETY) ×2 IMPLANT
COVER LIGHT HANDLE STERIS (MISCELLANEOUS) ×8 IMPLANT
COVER PROBE W GEL 5X96 (DRAPES) IMPLANT
DECANTER SPIKE VIAL GLASS SM (MISCELLANEOUS) ×4 IMPLANT
DRAPE HIP W/POCKET STRL (DRAPE) ×2 IMPLANT
DRSG MEPILEX BORDER 4X12 (GAUZE/BANDAGES/DRESSINGS) ×2 IMPLANT
ELECT REM PT RETURN 9FT ADLT (ELECTROSURGICAL) ×2
ELECTRODE REM PT RTRN 9FT ADLT (ELECTROSURGICAL) ×1 IMPLANT
EVACUATOR 3/16  PVC DRAIN (DRAIN)
EVACUATOR 3/16 PVC DRAIN (DRAIN) IMPLANT
FACESHIELD LNG OPTICON STERILE (SAFETY) IMPLANT
GLOVE BIOGEL PI IND STRL 7.0 (GLOVE) ×1 IMPLANT
GLOVE BIOGEL PI IND STRL 7.5 (GLOVE) ×1 IMPLANT
GLOVE BIOGEL PI INDICATOR 7.0 (GLOVE) ×1
GLOVE BIOGEL PI INDICATOR 7.5 (GLOVE) ×1
GLOVE ECLIPSE 6.5 STRL STRAW (GLOVE) ×2 IMPLANT
GLOVE ECLIPSE 7.0 STRL STRAW (GLOVE) ×2 IMPLANT
GLOVE EXAM NITRILE MD LF STRL (GLOVE) ×2 IMPLANT
GLOVE SKINSENSE NS SZ8.0 LF (GLOVE) ×1
GLOVE SKINSENSE STRL SZ8.0 LF (GLOVE) ×1 IMPLANT
GLOVE SS N UNI LF 8.5 STRL (GLOVE) ×2 IMPLANT
GOWN STRL REUS W/TWL LRG LVL3 (GOWN DISPOSABLE) ×4 IMPLANT
GOWN STRL REUS W/TWL XL LVL3 (GOWN DISPOSABLE) ×2 IMPLANT
INST SET MAJOR BONE (KITS) ×2 IMPLANT
KIT BLADEGUARD II DBL (SET/KITS/TRAYS/PACK) ×2 IMPLANT
KIT ROOM TURNOVER APOR (KITS) ×2 IMPLANT
MANIFOLD NEPTUNE II (INSTRUMENTS) ×2 IMPLANT
MARKER SKIN DUAL TIP RULER LAB (MISCELLANEOUS) ×2 IMPLANT
NEEDLE HYPO 21X1.5 SAFETY (NEEDLE) ×2 IMPLANT
NS IRRIG 1000ML POUR BTL (IV SOLUTION) ×2 IMPLANT
PACK ARTHRO LIMB DRAPE STRL (MISCELLANEOUS) ×2 IMPLANT
PACK TOTAL JOINT (CUSTOM PROCEDURE TRAY) ×2 IMPLANT
PAD ARMBOARD 7.5X6 YLW CONV (MISCELLANEOUS) ×2 IMPLANT
PASSER SUT SWANSON 36MM LOOP (INSTRUMENTS) ×2 IMPLANT
PILLOW HIP ABDUCTION LRG (ORTHOPEDIC SUPPLIES) IMPLANT
PILLOW HIP ABDUCTION MED (ORTHOPEDIC SUPPLIES) IMPLANT
PILLOW HIP ABDUCTION SM (ORTHOPEDIC SUPPLIES) ×2 IMPLANT
PIN STMN 9X.142 IN (PIN) IMPLANT
SET BASIN LINEN APH (SET/KITS/TRAYS/PACK) ×2 IMPLANT
STAPLER VISISTAT 35W (STAPLE) ×2 IMPLANT
STEINMANN PIN 5/32" X 9" ×4 IMPLANT
SUT BRALON NAB BRD #1 30IN (SUTURE) ×4 IMPLANT
SUT ETHIBOND 5 LR DA (SUTURE) ×4 IMPLANT
SUT MNCRL 0 VIOLET CTX 36 (SUTURE) ×1 IMPLANT
SUT MON AB 2-0 CT1 36 (SUTURE) ×2 IMPLANT
SUT MONOCRYL 0 CTX 36 (SUTURE) ×1
SUT VIC AB 1 CT1 27 (SUTURE)
SUT VIC AB 1 CT1 27XBRD ANTBC (SUTURE) IMPLANT
SYR 30ML LL (SYRINGE) ×2 IMPLANT
SYR BULB IRRIGATION 50ML (SYRINGE) ×2 IMPLANT
TOWER CARTRIDGE SMART MIX (DISPOSABLE) IMPLANT
TRAY FOLEY CATH 16FR SILVER (SET/KITS/TRAYS/PACK) IMPLANT
WATER STERILE IRR 1000ML POUR (IV SOLUTION) ×4 IMPLANT
YANKAUER SUCT 12FT TUBE ARGYLE (SUCTIONS) ×2 IMPLANT

## 2014-01-01 NOTE — Anesthesia Postprocedure Evaluation (Signed)
  Anesthesia Post-op Note  Patient: [name]  Procedure(s) Performed: Procedure(s): BIPOLAR ARTHROPLASTY RIGHT HIP (Right)  Patient Location: PACU  Anesthesia Type:Spinal  Level of Consciousness: awake, alert  and patient cooperative  Airway and Oxygen Therapy: Patient Spontanous Breathing and Patient connected to nasal cannula oxygen  Post-op Pain: none  Post-op Assessment: Post-op Vital signs reviewed, Patient's Cardiovascular Status Stable, Respiratory Function Stable, Patent Airway, No signs of Nausea or vomiting and Pain level controlled  Post-op Vital Signs: Reviewed and stable  Last Vitals:  Filed Vitals:   01/01/14 1700  BP: 159/66  Pulse: 92  Temp: 37.1 C  Resp: 19    Complications: No apparent anesthesia complications

## 2014-01-01 NOTE — Anesthesia Preprocedure Evaluation (Signed)
Anesthesia Evaluation  Patient identified by MRN, date of birth, ID band Patient confused  General Assessment Comment:Oriented to self only; known dementia  Reviewed: Allergy & Precautions, H&P , NPO status , Patient's Chart, lab work & pertinent test results, reviewed documented beta blocker date and time   Airway Mallampati: II TM Distance: >3 FB Neck ROM: limited    Dental  (+) Teeth Intact   Pulmonary neg pulmonary ROS,  breath sounds clear to auscultation        Cardiovascular negative cardio ROS  Rhythm:regular     Neuro/Psych Dementia; 24 hour caregiver    GI/Hepatic Neg liver ROS, Sips Ginger Ale this morning at 2am   Endo/Other  negative endocrine ROS  Renal/GU negative Renal ROS  negative genitourinary   Musculoskeletal   Abdominal   Peds  Hematology  (+) anemia , Type and crossed for 2 units   Anesthesia Other Findings   Reproductive/Obstetrics negative OB ROS                           Anesthesia Physical Anesthesia Plan  ASA: III and emergent  Anesthesia Plan: Spinal   Post-op Pain Management:    Induction:   Airway Management Planned:   Additional Equipment:   Intra-op Plan:   Post-operative Plan:   Informed Consent: I have reviewed the patients History and Physical, chart, labs and discussed the procedure including the risks, benefits and alternatives for the proposed anesthesia with the patient or authorized representative who has indicated his/her understanding and acceptance.   Dental Advisory Given  Plan Discussed with: Anesthesiologist and Surgeon  Anesthesia Plan Comments:         Anesthesia Quick Evaluation

## 2014-01-01 NOTE — Consult Note (Signed)
Reason for Consult: right hip fracture  Referring Physician: Dr. Greg Cutter is an 80 y.o. female.  HPI:  80 year-old female household ambulator with assistance, progressing dementia worsened over the last 6 months otherwise healthy, 24 hour 7 day a week caregivers, lost her balance and fell fractured her right hip can't walk complains of severe right hip pain nonradiating associated with leg deformity.  Past Medical History  Diagnosis Date  . Dementia   . Osteoarthritis     Past Surgical History  Procedure Laterality Date  . Breast surgery    . Eye surgery      History reviewed. No pertinent family history.  Social History:  reports that she has never smoked. She does not have any smokeless tobacco history on file. She reports that she does not drink alcohol or use illicit drugs.  Allergies: No Known Allergies  Medications: I have reviewed the patient's current medications.  Results for orders placed during the hospital encounter of 01/01/14 (from the past 48 hour(s))  CBC WITH DIFFERENTIAL     Status: Abnormal   Collection Time    01/01/14  4:03 AM      Result Value Ref Range   WBC 4.4  4.0 - 10.5 K/uL   RBC 3.55 (*) 3.87 - 5.11 MIL/uL   Hemoglobin 11.2 (*) 12.0 - 15.0 g/dL   HCT 33.9 (*) 36.0 - 46.0 %   MCV 95.5  78.0 - 100.0 fL   MCH 31.5  26.0 - 34.0 pg   MCHC 33.0  30.0 - 36.0 g/dL   RDW 12.9  11.5 - 15.5 %   Platelets 148 (*) 150 - 400 K/uL   Neutrophils Relative % 66  43 - 77 %   Neutro Abs 2.9  1.7 - 7.7 K/uL   Lymphocytes Relative 16  12 - 46 %   Lymphs Abs 0.7  0.7 - 4.0 K/uL   Monocytes Relative 18 (*) 3 - 12 %   Monocytes Absolute 0.8  0.1 - 1.0 K/uL   Eosinophils Relative 1  0 - 5 %   Eosinophils Absolute 0.0  0.0 - 0.7 K/uL   Basophils Relative 1  0 - 1 %   Basophils Absolute 0.0  0.0 - 0.1 K/uL  BASIC METABOLIC PANEL     Status: Abnormal   Collection Time    01/01/14  4:03 AM      Result Value Ref Range   Sodium 142  137 - 147 mEq/L    Potassium 3.9  3.7 - 5.3 mEq/L   Chloride 104  96 - 112 mEq/L   CO2 28  19 - 32 mEq/L   Glucose, Bld 112 (*) 70 - 99 mg/dL   BUN 15  6 - 23 mg/dL   Creatinine, Ser 0.74  0.50 - 1.10 mg/dL   Calcium 8.8  8.4 - 10.5 mg/dL   GFR calc non Af Amer 75 (*) >90 mL/min   GFR calc Af Amer 87 (*) >90 mL/min   Comment: (NOTE)     The eGFR has been calculated using the CKD EPI equation.     This calculation has not been validated in all clinical situations.     eGFR's persistently <90 mL/min signify possible Chronic Kidney     Disease.  URINALYSIS, ROUTINE W REFLEX MICROSCOPIC     Status: None   Collection Time    01/01/14  5:29 AM      Result Value Ref Range   Color,  Urine YELLOW  YELLOW   APPearance CLEAR  CLEAR   Specific Gravity, Urine 1.015  1.005 - 1.030   pH 7.0  5.0 - 8.0   Glucose, UA NEGATIVE  NEGATIVE mg/dL   Hgb urine dipstick NEGATIVE  NEGATIVE   Bilirubin Urine NEGATIVE  NEGATIVE   Ketones, ur NEGATIVE  NEGATIVE mg/dL   Protein, ur NEGATIVE  NEGATIVE mg/dL   Urobilinogen, UA 0.2  0.0 - 1.0 mg/dL   Nitrite NEGATIVE  NEGATIVE   Leukocytes, UA NEGATIVE  NEGATIVE   Comment: MICROSCOPIC NOT DONE ON URINES WITH NEGATIVE PROTEIN, BLOOD, LEUKOCYTES, NITRITE, OR GLUCOSE <1000 mg/dL.    Dg Chest 1 View  01/01/2014   CLINICAL DATA:  Chest pain  EXAM: CHEST - 1 VIEW  COMPARISON:  Prior radiograph from 12/15/2013  FINDINGS: Cardiomegaly is stable as compared to prior study. Atherosclerotic calcifications present within the aortic arch.  The lungs are normally inflated. Irregular biapical pleural thickening is unchanged. Chronic coarsening of the interstitial markings also stable. No airspace consolidation or pulmonary edema. No definite pleural effusion, although the left costophrenic angle is incompletely visualized. There is no pneumothorax.  No acute osseous abnormality identified. Remote right-sided rib fractures again noted.  IMPRESSION: Stable appearance of the chest with no acute  cardiopulmonary abnormality identified.   Electronically Signed   By: Jeannine Boga M.D.   On: 01/01/2014 05:27   Dg Hip Complete Right  01/01/2014   CLINICAL DATA:  Fall with right hip pain  EXAM: RIGHT HIP - COMPLETE 2+ VIEW  COMPARISON:  None.  FINDINGS: Acute subcapital right femoral neck fracture with varus angulation. The femoral head remains located. No evidence of pelvic ring or left hip fracture. 3.3 cm calcified mass in the right pelvis, usually from fibroid degeneration.  IMPRESSION: Subcapital right femoral neck fracture.   Electronically Signed   By: Jorje Guild M.D.   On: 01/01/2014 05:13   Ct Head Wo Contrast  01/01/2014   CLINICAL DATA:  Fall with head injury  EXAM: CT HEAD WITHOUT CONTRAST  CT CERVICAL SPINE WITHOUT CONTRAST  TECHNIQUE: Multidetector CT imaging of the head and cervical spine was performed following the standard protocol without intravenous contrast. Multiplanar CT image reconstructions of the cervical spine were also generated.  COMPARISON:  12/15/2013  FINDINGS: CT HEAD FINDINGS  Skull and Sinuses:No significant abnormality.  Orbits: No acute abnormality.  Brain: No evidence of acute abnormality, such as acute infarction, hemorrhage, hydrocephalus, or mass lesion/mass effect. Mild chronic small vessel disease for age, with ischemic gliosis confluent around the lateral ventricles. Generalized cerebral volume loss, age congruent. Calcified intracranial atherosclerosis.  CT CERVICAL SPINE FINDINGS  No evidence of acute fracture or traumatic malalignment. Multilevel degenerative disc disease, with maximal disc narrowing and endplate degeneration at C3-4. Facet osteoarthritis with spurring and sclerosis most notable in the upper cervical region, resulting in C3-4 facet ankylosis on the right. No prevertebral edema or gross cervical canal hematoma. No high-grade spinal canal stenosis. Biapical pleural parenchymal scarring.  IMPRESSION: No acute intracranial or cervical  spine injury.   Electronically Signed   By: Jorje Guild M.D.   On: 01/01/2014 05:24   Ct Cervical Spine Wo Contrast  01/01/2014   CLINICAL DATA:  Fall with head injury  EXAM: CT HEAD WITHOUT CONTRAST  CT CERVICAL SPINE WITHOUT CONTRAST  TECHNIQUE: Multidetector CT imaging of the head and cervical spine was performed following the standard protocol without intravenous contrast. Multiplanar CT image reconstructions of the cervical spine were also  generated.  COMPARISON:  12/15/2013  FINDINGS: CT HEAD FINDINGS  Skull and Sinuses:No significant abnormality.  Orbits: No acute abnormality.  Brain: No evidence of acute abnormality, such as acute infarction, hemorrhage, hydrocephalus, or mass lesion/mass effect. Mild chronic small vessel disease for age, with ischemic gliosis confluent around the lateral ventricles. Generalized cerebral volume loss, age congruent. Calcified intracranial atherosclerosis.  CT CERVICAL SPINE FINDINGS  No evidence of acute fracture or traumatic malalignment. Multilevel degenerative disc disease, with maximal disc narrowing and endplate degeneration at C3-4. Facet osteoarthritis with spurring and sclerosis most notable in the upper cervical region, resulting in C3-4 facet ankylosis on the right. No prevertebral edema or gross cervical canal hematoma. No high-grade spinal canal stenosis. Biapical pleural parenchymal scarring.  IMPRESSION: No acute intracranial or cervical spine injury.   Electronically Signed   By: Jorje Guild M.D.   On: 01/01/2014 05:24    Review of Systems  Unable to perform ROS Musculoskeletal: Positive for falls.   Blood pressure 172/75, pulse 106, temperature 97.7 F (36.5 C), temperature source Oral, resp. rate 18, height _0  (1.575 m), weight 125 lb (56.7 kg), SpO2 95.00%. Physical Exam  General the patient is well-developed and well-nourished grooming and hygiene are normal Oriented person, place  Mood and affect normal Ambulation cannot  ambulate Inspection of the right lower extremity, no motion at the hip, all joints reduced. Motor exam normal. Skin clean dry and intact. Tenderness over the right hip. Left lower extremity normal  Upper extremities Full range of motion All joints are stable Motor exam is normal Skin healed abrasion right forearm otherwise normal  Cardiovascular exam is normal Sensory exam normal   Assessment/Plan: Displaced right femoral neck fracture. The patient will require partial hip replacement. The risks and benefits of the procedure have been discussed with the primary guardian. This includes but is not limited to worsening dementia, confusion, pneumonia, urinary tract infection, deep vein thrombosis, wound infection, dislocation, wound infection.  Plan to do a right partial hip replacement with a [place] basic fracture stem  Carole Civil 01/01/2014, 10:21 AM

## 2014-01-01 NOTE — Brief Op Note (Signed)
01/01/2014  4:54 PM  PATIENT:  [name]  80 y.o. female  PRE-OPERATIVE DIAGNOSIS:  right femoral neck fracture  POST-OPERATIVE DIAGNOSIS:  right femoral neck fracture  FINDINGS DISPLACED FEMORAL NECK FRACTURE RIGHT HIP, WITH  MILD OA   IMPLANTS: DEPUY [place] BASIC PF STEM SIZE 5 WITH 1.5 28 MM INNER HEAD AND 49 MM OD HEAD   PROCEDURE:  Procedure(s): BIPOLAR ARTHROPLASTY RIGHT HIP (Right)  SURGEON:  Surgeon(s) and Role:    * Carole Civil, MD - Primary  PHYSICIAN ASSISTANT:   ASSISTANTS: HOLLY PROZTEK   ANESTHESIA:   spinal  EBL:  Total I/O In: 1000 [I.V.:1000] Out: 1250 [Urine:950; Blood:300]  BLOOD ADMINISTERED:none  DRAINS: none   LOCAL MEDICATIONS USED:  MARCAINE   60CC  SPECIMEN:  No Specimen  DISPOSITION OF SPECIMEN:  N/A  COUNTS:  YES  TOURNIQUET:  * No tourniquets in log *  DICTATION: .Dragon Dictation  PLAN OF CARE: Admit to inpatient   PATIENT DISPOSITION:  PACU - hemodynamically stable.   Delay start of Pharmacological VTE agent (>24hrs) due to surgical blood loss or risk of bleeding: yes  DETAILS OF PROCEDURE:  The patient was identified in the preop holding area and the surgical site was marked and countersigned by the surgeon, the chart was updated. The consent was signed.  The patient was taken to the operating room for spinal anesthetic and then placed in the lateral decubitus position with appropriate padding and axillary roll. 2g of ANCEF were given because of the patient's weight of < 80 KG   After sterile prep and drape the timeout was executed  A lateral incision was made centered over the RIGHT greater trochanter to perform a direct lateral approach to the hip. After dividing the subcutaneous tissue down to the fascia, the fascia was split in line with the skin incision. Electrocautery was used to obtain hemostasis.   After deep retractors were placed, the gluteus medius was defined and the anterior half was peeled from the  group trochanter in continuity with the vastus lateralis; the anterior branch to the femoral circumflex artery was cauterized. Subperiosteal dissection continued until the gluteus minimus along with the gluteus medius was retracted proximally. The hip was dislocated anteriorly, a provisional femoral neck cut was made using the cutting guide. The lesser trochanter was then identified with further soft tissue dissection and a second femoral neck cut was made using the same guide.   A box osteotome was used to lateralize entry point into the femoral canal, this was followed by a femoral canal finder and subsequent broaching up to a size 5 femur. THE HEAD WAS MEASURED TO SIZE 49  Trial reductions were then performed starting with SIZE 5 F  49H 1.5 NECK LENGTH. Leg length and stability were restored with THESE IMPLANTS . We confirmed knee flexion past 90. ROM: 5 hyperextension with 50 of external rotation. We had adequate shuck test. Sleeping position was stable. At 90 flexion the hip internally rotated 50 without dislocation.  The trial components were then removed, drill holes were placed in the trochanter and  #5 sutures were passed through the drill holes; the stem was placed followed by the femoral head.  The hip was reduced. The ROM tests were repeated and were satisfactory.The # 5 sutures were used to repair the abductors with the leg slightly internally rotated.  The wound was irrigated and 30 cc of Marcaine with epinephrine was injected into the sub-gluteus medius area. Fascia was closed with the  leg abducted with #1 Bralon sutures; followed by subfascial injection of 30 cc of Marcaine with epinephrine.  Subcutaneous tissue was closed with 0 Monocryl AND 2-0 MONOCRYL  Skin staples were used to reapproximate the skin edges and a sterile dressing was applied. The  pain pump catheter was activated.  The patient was taken to the recovery room in stable condition.  Standard postop total hip  arthroplasty protocol for direct lateral approach.

## 2014-01-01 NOTE — ED Notes (Addendum)
Pt states she fell when she came out of the bathroom, was stepping backward and she thinks she may have tripped on something and then just went down.  Pt states she landed on her right side and is having pain to right hip.  Pt also has small laceration to back of her head.

## 2014-01-01 NOTE — Op Note (Signed)
01/01/2014  4:54 PM  PATIENT:  [name]  80 y.o. female  PRE-OPERATIVE DIAGNOSIS:  right femoral neck fracture  POST-OPERATIVE DIAGNOSIS:  right femoral neck fracture  FINDINGS DISPLACED FEMORAL NECK FRACTURE RIGHT HIP, WITH  MILD OA   IMPLANTS: DEPUY [place] BASIC PF STEM SIZE 5 WITH 1.5 28 MM INNER HEAD AND 49 MM OD HEAD   PROCEDURE:  Procedure(s): BIPOLAR ARTHROPLASTY RIGHT HIP (Right)  SURGEON:  Surgeon(s) and Role:    * Carole Civil, MD - Primary  PHYSICIAN ASSISTANT:   ASSISTANTS: HOLLY PROZTEK   ANESTHESIA:   spinal  EBL:  Total I/O In: 1000 [I.V.:1000] Out: 1250 [Urine:950; Blood:300]  BLOOD ADMINISTERED:none  DRAINS: none   LOCAL MEDICATIONS USED:  MARCAINE   60CC  SPECIMEN:  No Specimen  DISPOSITION OF SPECIMEN:  N/A  COUNTS:  YES  TOURNIQUET:  * No tourniquets in log *  DICTATION: .Dragon Dictation  PLAN OF CARE: Admit to inpatient   PATIENT DISPOSITION:  PACU - hemodynamically stable.   Delay start of Pharmacological VTE agent (>24hrs) due to surgical blood loss or risk of bleeding: yes  DETAILS OF PROCEDURE:  The patient was identified in the preop holding area and the surgical site was marked and countersigned by the surgeon, the chart was updated. The consent was signed.  The patient was taken to the operating room for spinal anesthetic and then placed in the lateral decubitus position with appropriate padding and axillary roll. 2g of ANCEF were given because of the patient's weight of < 80 KG   After sterile prep and drape the timeout was executed  A lateral incision was made centered over the RIGHT greater trochanter to perform a direct lateral approach to the hip. After dividing the subcutaneous tissue down to the fascia, the fascia was split in line with the skin incision. Electrocautery was used to obtain hemostasis.   After deep retractors were placed, the gluteus medius was defined and the anterior half was peeled from the  group trochanter in continuity with the vastus lateralis; the anterior branch to the femoral circumflex artery was cauterized. Subperiosteal dissection continued until the gluteus minimus along with the gluteus medius was retracted proximally. The hip was dislocated anteriorly, a provisional femoral neck cut was made using the cutting guide. The lesser trochanter was then identified with further soft tissue dissection and a second femoral neck cut was made using the same guide.   A box osteotome was used to lateralize entry point into the femoral canal, this was followed by a femoral canal finder and subsequent broaching up to a size 5 femur. THE HEAD WAS MEASURED TO SIZE 49  Trial reductions were then performed starting with SIZE 5 F  49H 1.5 NECK LENGTH. Leg length and stability were restored with THESE IMPLANTS . We confirmed knee flexion past 90. ROM: 5 hyperextension with 50 of external rotation. We had adequate shuck test. Sleeping position was stable. At 90 flexion the hip internally rotated 50 without dislocation.  The trial components were then removed, drill holes were placed in the trochanter and  #5 sutures were passed through the drill holes; the stem was placed followed by the femoral head.  The hip was reduced. The ROM tests were repeated and were satisfactory.The # 5 sutures were used to repair the abductors with the leg slightly internally rotated.  The wound was irrigated and 30 cc of Marcaine with epinephrine was injected into the sub-gluteus medius area. Fascia was closed with the  leg abducted with #1 Bralon sutures; followed by subfascial injection of 30 cc of Marcaine with epinephrine.  Subcutaneous tissue was closed with 0 Monocryl AND 2-0 MONOCRYL  Skin staples were used to reapproximate the skin edges and a sterile dressing was applied. The  pain pump catheter was activated.  The patient was taken to the recovery room in stable condition.  Standard postop total hip  arthroplasty protocol for direct lateral approach.

## 2014-01-01 NOTE — Anesthesia Procedure Notes (Addendum)
Date/Time: 01/01/2014 2:35 PM Performed by: Andree Elk, AMY A Pre-anesthesia Checklist: Patient identified, Timeout performed, Emergency Drugs available, Patient being monitored and Suction available Oxygen Delivery Method: Simple face mask   Spinal  Patient location during procedure: OR Start time: 01/01/2014 2:57 PM Preanesthetic Checklist Completed: patient identified, site marked, surgical consent, pre-op evaluation, timeout performed, IV checked, risks and benefits discussed and monitors and equipment checked Spinal Block Patient position: right lateral decubitus Prep: Betadine Patient monitoring: heart rate, cardiac monitor, continuous pulse ox and blood pressure Approach: right paramedian Location: L3-4 Injection technique: single-shot Needle Needle type: Spinocan  Needle gauge: 22 G Needle length: 9 cm Assessment Sensory level: T8 Additional Notes ATTEMPTS:1 TRAY [id number] [place] UMPN:[id number]

## 2014-01-01 NOTE — H&P (Signed)
PCP:   Purvis Kilts, MD   Chief Complaint:  Fall  HPI: 80 year old female who   has a past medical history of Dementia and Osteoarthritis. today was brought to the ED after patient sustained a mechanical fall at home. As per patient's niece, patient got up from the bed to go to the bathroom lost her balance and fell backwards hitting her head and right hip. Patient experienced pain in the right hip, she has 24 hour caregivers, who informed the niece. Patient was brought to the ED and found right subcapital femoral neck fracture.  Patient at this time rates the pain in the right hip is mild after she got the pain medications. She denies passing out, no nausea vomiting or diarrhea. Patient has been having chest wall pain since she fell 2 weeks ago. There is no history of CAD or stroke. Patient before the previous fall was able to walk 2 blocks without difficulty. Patient also has sustained small laceration in the posterior scalp.    Allergies:  No Known Allergies    Past Medical History  Diagnosis Date  . Dementia   . Osteoarthritis     Past Surgical History  Procedure Laterality Date  . Breast surgery    . Eye surgery      Prior to Admission medications   Medication Sig Start Date End Date Taking? Authorizing Provider  citalopram (CELEXA) 20 MG tablet Take 20 mg by mouth daily.    Historical Provider, MD  IRON PO Take 1 tablet by mouth daily.    Historical Provider, MD  Multiple Vitamins-Minerals (ICAPS AREDS FORMULA PO) Take 1 tablet by mouth daily.    Historical Provider, MD  Multiple Vitamins-Minerals (MULTIVITAMINS THER. W/MINERALS) TABS Take 1 tablet by mouth daily.    Historical Provider, MD  OVER THE COUNTER MEDICATION Place 2 drops into both eyes daily as needed (dry eyes).    Historical Provider, MD  polyethylene glycol (MIRALAX / GLYCOLAX) packet Take 17 g by mouth daily.    Historical Provider, MD  raloxifene (EVISTA) 60 MG tablet Take 60 mg by mouth daily.       Historical Provider, MD    Social History:  reports that she has never smoked. She does not have any smokeless tobacco history on file. She reports that she does not drink alcohol or use illicit drugs.    All the positives are listed in BOLD  Review of Systems:  HEENT: Headache, blurred vision, runny nose, sore throat Neck: Hypothyroidism, hyperthyroidism,,lymphadenopathy Chest : Shortness of breath, history of COPD, Asthma Heart : Chest pain, history of coronary arterey disease GI:  Nausea, vomiting, diarrhea, constipation, GERD GU: Dysuria, urgency, frequency of urination, hematuria Neuro: Stroke, seizures, syncope Psych: Depression, anxiety, hallucinations   Physical Exam: Blood pressure 152/62, pulse 101, temperature 98.5 F (36.9 C), temperature source Oral, resp. rate 20, weight 52.164 kg (115 lb), SpO2 95.00%. Constitutional:   Patient is a well-developed and well-nourished demented female* in no acute distress and cooperative with exam. Head: Small laceration in the posterior scalp Mouth: Mucus membranes moist Eyes: PERRL, EOMI, conjunctivae normal Neck: Supple, No Thyromegaly Cardiovascular: RRR, S1 normal, S2 normal Pulmonary/Chest: CTAB, no wheezes, rales, or rhonchi Abdominal: Soft. Non-tender, non-distended, bowel sounds are normal, no masses, organomegaly, or guarding present.  Neurological: A&O x3, Strenght is normal and symmetric bilaterally, cranial nerve II-XII are grossly intact, no focal motor deficit, sensory intact to light touch bilaterally.  Extremities : No Cyanosis, Clubbing or Edema  Labs  on Admission:  Basic Metabolic Panel:  Recent Labs Lab 01/01/14 0403  NA 142  K 3.9  CL 104  CO2 28  GLUCOSE 112*  BUN 15  CREATININE 0.74  CALCIUM 8.8   Liver Function Tests: No results found for this basename: AST, ALT, ALKPHOS, BILITOT, PROT, ALBUMIN,  in the last 168 hours No results found for this basename: LIPASE, AMYLASE,  in the last 168  hours No results found for this basename: AMMONIA,  in the last 168 hours CBC:  Recent Labs Lab 01/01/14 0403  WBC 4.4  NEUTROABS 2.9  HGB 11.2*  HCT 33.9*  MCV 95.5  PLT 148*    Radiological Exams on Admission: Dg Chest 1 View  01/01/2014   CLINICAL DATA:  Chest pain  EXAM: CHEST - 1 VIEW  COMPARISON:  Prior radiograph from 12/15/2013  FINDINGS: Cardiomegaly is stable as compared to prior study. Atherosclerotic calcifications present within the aortic arch.  The lungs are normally inflated. Irregular biapical pleural thickening is unchanged. Chronic coarsening of the interstitial markings also stable. No airspace consolidation or pulmonary edema. No definite pleural effusion, although the left costophrenic angle is incompletely visualized. There is no pneumothorax.  No acute osseous abnormality identified. Remote right-sided rib fractures again noted.  IMPRESSION: Stable appearance of the chest with no acute cardiopulmonary abnormality identified.   Electronically Signed   By: Jeannine Boga M.D.   On: 01/01/2014 05:27   Dg Hip Complete Right  01/01/2014   CLINICAL DATA:  Fall with right hip pain  EXAM: RIGHT HIP - COMPLETE 2+ VIEW  COMPARISON:  None.  FINDINGS: Acute subcapital right femoral neck fracture with varus angulation. The femoral head remains located. No evidence of pelvic ring or left hip fracture. 3.3 cm calcified mass in the right pelvis, usually from fibroid degeneration.  IMPRESSION: Subcapital right femoral neck fracture.   Electronically Signed   By: Jorje Guild M.D.   On: 01/01/2014 05:13   Ct Head Wo Contrast  01/01/2014   CLINICAL DATA:  Fall with head injury  EXAM: CT HEAD WITHOUT CONTRAST  CT CERVICAL SPINE WITHOUT CONTRAST  TECHNIQUE: Multidetector CT imaging of the head and cervical spine was performed following the standard protocol without intravenous contrast. Multiplanar CT image reconstructions of the cervical spine were also generated.  COMPARISON:   12/15/2013  FINDINGS: CT HEAD FINDINGS  Skull and Sinuses:No significant abnormality.  Orbits: No acute abnormality.  Brain: No evidence of acute abnormality, such as acute infarction, hemorrhage, hydrocephalus, or mass lesion/mass effect. Mild chronic small vessel disease for age, with ischemic gliosis confluent around the lateral ventricles. Generalized cerebral volume loss, age congruent. Calcified intracranial atherosclerosis.  CT CERVICAL SPINE FINDINGS  No evidence of acute fracture or traumatic malalignment. Multilevel degenerative disc disease, with maximal disc narrowing and endplate degeneration at C3-4. Facet osteoarthritis with spurring and sclerosis most notable in the upper cervical region, resulting in C3-4 facet ankylosis on the right. No prevertebral edema or gross cervical canal hematoma. No high-grade spinal canal stenosis. Biapical pleural parenchymal scarring.  IMPRESSION: No acute intracranial or cervical spine injury.   Electronically Signed   By: Jorje Guild M.D.   On: 01/01/2014 05:24   Ct Cervical Spine Wo Contrast  01/01/2014   CLINICAL DATA:  Fall with head injury  EXAM: CT HEAD WITHOUT CONTRAST  CT CERVICAL SPINE WITHOUT CONTRAST  TECHNIQUE: Multidetector CT imaging of the head and cervical spine was performed following the standard protocol without intravenous contrast. Multiplanar CT image  reconstructions of the cervical spine were also generated.  COMPARISON:  12/15/2013  FINDINGS: CT HEAD FINDINGS  Skull and Sinuses:No significant abnormality.  Orbits: No acute abnormality.  Brain: No evidence of acute abnormality, such as acute infarction, hemorrhage, hydrocephalus, or mass lesion/mass effect. Mild chronic small vessel disease for age, with ischemic gliosis confluent around the lateral ventricles. Generalized cerebral volume loss, age congruent. Calcified intracranial atherosclerosis.  CT CERVICAL SPINE FINDINGS  No evidence of acute fracture or traumatic malalignment.  Multilevel degenerative disc disease, with maximal disc narrowing and endplate degeneration at C3-4. Facet osteoarthritis with spurring and sclerosis most notable in the upper cervical region, resulting in C3-4 facet ankylosis on the right. No prevertebral edema or gross cervical canal hematoma. No high-grade spinal canal stenosis. Biapical pleural parenchymal scarring.  IMPRESSION: No acute intracranial or cervical spine injury.   Electronically Signed   By: Jorje Guild M.D.   On: 01/01/2014 05:24    EKG: Independently reviewed- sinus tachycardia   Assessment/Plan Principal Problem:   Femoral neck fracture Active Problems:   Dementia   Depression   Fracture of femoral neck, right, closed  Femoral neck fracture Patient will be admitted to the medicine service. Orthopedic service will be consulted, ED physician is going to call orthopedics. We'll continue with the hip fracture protocol, Dilaudid when necessary for the pain.  Scalp laceration Will be sutured by the ED physician CT head showed no acute abnormality.  Dementia Stable  Depression Continue Celexa  DVT prophylaxis Heparin  Risk stratification- patient is a moderate risk for surgery due to her advanced age  Code status: Patient is code  Family discussion- discussed with patient's niece at bedside, daughter of the niece is the healthcare power of attorney.  Time Spent on Admission:  60 minutes  Oswald Hillock [facility] Pager: [phone] 01/01/2014, 6:14 AM  If 7PM-7AM, please contact night-coverage  [email or web address]  Password [email or web address]

## 2014-01-01 NOTE — Progress Notes (Signed)
PROGRESS NOTE  [name] QQV:[id number] DOB: [birth year] DOA: 01/01/2014 PCP: Purvis Kilts, MD  Summary: 80 year old woman with a complicated past medical history consisting of dementia and osteoarthritis who presented to the emergency department after a mechanical fall at home when she lost her balance and fell backwards hitting her head and landing on her right hip. Imaging revealed right femoral neck fracture. CT of the head and neck were unremarkable the patient was admitted for orthopedic consultation and treatment.  Family at bedside, sister and niece report the patient's in excellent health with no history of cardiac problems, chest pain diabetes or stroke.  Assessment/Plan: 1. Subcapital right hip fracture status post mechanical fall. Plan for operative intervention per orthopedics. DT prophylaxis per orthopedics. 2. Chest discomfort secondary to previous fall 5/14. Appears to have resolved. No history of cardiac disease. 3. Dementia. Appears to be stable.   Operative intervention today.  Discussed in detail with sister and niece at bedside.  Code Status: full code DVT prophylaxis: SCDs, Lovenox per orthopedics after surgery Family Communication:  Disposition Plan: likely will benefit from SNF  Murray Hodgkins, MD  [facility]  Pager [phone] If 7PM-7AM, please contact night-coverage at [email or web address], password [facility] 01/01/2014, 4:47 PM  LOS: 0 days   Consultants:  Orthopedics  Procedures:    Antibiotics:    HPI/Subjective: Reports right hip pain. Otherwise feels okay. No chest pain. Breathing okay.  Objective: Filed Vitals:   01/01/14 0615 01/01/14 0630 01/01/14 0721 01/01/14 0813  BP:  155/71 134/57 172/75  Pulse: 99 102 103 106  Temp:    97.7 F (36.5 C)  TempSrc:    Oral  Resp: 18 19 18 18   Height:    5\' 2"  (1.575 m)  Weight:    56.7 kg (125 lb)  SpO2: 97% 98% 94% 95%    Intake/Output Summary (Last 24 hours) at 01/01/14 1647 Last  data filed at 01/01/14 1643  Gross per 24 hour  Intake   1000 ml  Output   1150 ml  Net   -150 ml     Filed Weights   01/01/14 0330 01/01/14 0813  Weight: 52.164 kg (115 lb) 56.7 kg (125 lb)    Exam:   Afebrile, vital signs are stable. No hypoxia. Normotensive to hypertensive.  Gen. Appears calm, uncomfortable but not toxic.  Cardiovascular regular rate and rhythm. No murmur, rub or gallop. No lower extremity edema.  Respiratory clear to auscultation bilaterally. No wheezes, rales or rhonchi. Normal respiratory effort.  Abdomen soft  Right leg is shortened and externally rotated. Dorsalis pedis pulse 2+. Left leg appears grossly unremarkable.  Data Reviewed: I/O: Excellent urine output Chemistry: Basic metabolic panel unremarkable. Heme: Hemoglobin 11.2. ID: Urinalysis negative. Other: EKG sinus tachycardia, no acute changes, premature supraventricular complexes. Imaging: Subcapital right hip fracture seen on plain film. Chest x-ray no acute disease. CT of the head and cervical spine negative for acute abnormalities/injuries.   Scheduled Meds: . [MAR HOLD] sodium chloride   Intravenous STAT  . [facility] HOLD] citalopram  20 mg Oral Daily  . [MAR HOLD] heparin  5,000 Units Subcutaneous 3 times per day  . [facility] HOLD] pneumococcal 23 valent vaccine  0.5 mL Intramuscular Tomorrow-1000  . [MAR HOLD] polyethylene glycol  17 g Oral Daily   Continuous Infusions:   Principal Problem:   Femoral neck fracture Active Problems:   Dementia   Depression   Fracture of femoral neck, right, closed   Time spent 20 minutes

## 2014-01-01 NOTE — Transfer of Care (Signed)
Immediate Anesthesia Transfer of Care Note  Patient: [name]  Procedure(s) Performed: Procedure(s): BIPOLAR ARTHROPLASTY RIGHT HIP (Right)  Patient Location: PACU  Anesthesia Type:Spinal  Level of Consciousness: awake, alert  and patient cooperative  Airway & Oxygen Therapy: Patient Spontanous Breathing and Patient connected to nasal cannula oxygen  Post-op Assessment: Report given to PACU RN and Post -op Vital signs reviewed and stable  Post vital signs: Reviewed and stable  Complications: No apparent anesthesia complications

## 2014-01-01 NOTE — ED Notes (Signed)
Update on pt vs and status given to nurse Lattie Haw on 3A. Per nurse room assignment has changed to room 302.

## 2014-01-01 NOTE — ED Provider Notes (Signed)
CSN: [id number]     Arrival date & time 01/01/14  [id number] History   First MD Initiated Contact with Patient 01/01/14 [phone]     Chief Complaint  Patient presents with  . Fall  . Hip Pain     (Consider location/radiation/quality/duration/timing/severity/associated sxs/prior Treatment) HPI Comments: 80 year old female with dementia presents with right hip pain after fall prior to arrival. Patient was coming out of the bathroom and stepped backwards causing her to trap any her to fall in her right side. Mild head injuries or loss consciousness. Patient remembers events. Small laceration the back of her head with mild bleeding. Unsure tetanus status. Patient denies chest pain shortness breath or severe headache. Right hip pain worse with range of motion, no history of hip fracture.  Patient is a 80 y.o. female presenting with fall and hip pain. The history is provided by the patient, medical records and the EMS personnel.  Fall This is a new problem. Pertinent negatives include no chest pain, no abdominal pain, no headaches and no shortness of breath.  Hip Pain Pertinent negatives include no chest pain, no abdominal pain, no headaches and no shortness of breath.    Past Medical History  Diagnosis Date  . Dementia   . Osteoarthritis    Past Surgical History  Procedure Laterality Date  . Breast surgery    . Eye surgery     No family history on file. History  Substance Use Topics  . Smoking status: Never Smoker   . Smokeless tobacco: Not on file  . Alcohol Use: No     Comment: Patient drinks coffee daily.   OB History   Grav Para Term Preterm Abortions TAB SAB Ect Mult Living                 Review of Systems  Constitutional: Negative for fever and chills.  HENT: Negative for congestion.   Eyes: Negative for visual disturbance.  Respiratory: Negative for shortness of breath.   Cardiovascular: Negative for chest pain.  Gastrointestinal: Negative for vomiting and abdominal pain.   Genitourinary: Negative for dysuria and flank pain.  Musculoskeletal: Positive for arthralgias and gait problem. Negative for back pain, neck pain and neck stiffness.  Skin: Negative for rash.  Neurological: Negative for light-headedness and headaches.      Allergies  Review of patient's allergies indicates no known allergies.  Home Medications   Prior to Admission medications   Medication Sig Start Date End Date Taking? Authorizing Provider  citalopram (CELEXA) 20 MG tablet Take 20 mg by mouth daily.    Historical Provider, MD  IRON PO Take 1 tablet by mouth daily.    Historical Provider, MD  Multiple Vitamins-Minerals (ICAPS AREDS FORMULA PO) Take 1 tablet by mouth daily.    Historical Provider, MD  Multiple Vitamins-Minerals (MULTIVITAMINS THER. W/MINERALS) TABS Take 1 tablet by mouth daily.    Historical Provider, MD  OVER THE COUNTER MEDICATION Place 2 drops into both eyes daily as needed (dry eyes).    Historical Provider, MD  polyethylene glycol (MIRALAX / GLYCOLAX) packet Take 17 g by mouth daily.    Historical Provider, MD  raloxifene (EVISTA) 60 MG tablet Take 60 mg by mouth daily.      Historical Provider, MD   BP 152/62  Pulse 99  Temp(Src) 98.5 F (36.9 C) (Oral)  Resp 18  Wt 115 lb (52.164 kg)  SpO2 97% Physical Exam  Nursing note and vitals reviewed. Constitutional: She appears well-developed and well-nourished.  HENT:  Head: Normocephalic.  Eyes: Conjunctivae are normal. Right eye exhibits no discharge. Left eye exhibits no discharge.  Neck: Normal range of motion. Neck supple. No tracheal deviation present.  Cardiovascular: Normal rate and regular rhythm.   Pulmonary/Chest: Effort normal and breath sounds normal.  Abdominal: Soft. She exhibits no distension. There is no tenderness. There is no guarding.  Musculoskeletal: She exhibits tenderness. She exhibits no edema.  Patient has tenderness right anterior lateral hip with any range of motion, mild  shortening and rotation. Neurovascularly intact distal leg.  Neurological: She is alert. GCS eye subscore is 4. GCS verbal subscore is 5. GCS motor subscore is 6.  Pleasant dementia answers most questions appropriately.  Skin: Skin is warm. No rash noted.  Laceration posterior scalp and mild bleeding controlled.  Psychiatric: She has a normal mood and affect.    ED Course  Procedures (including critical care time) LACERATION REPAIR Performed by: Mariea Clonts Authorized by: Mariea Clonts Consent: Verbal consent obtained. Risks and benefits: risks, benefits and alternatives were discussed Consent given by: patient Patient identity confirmed: provided demographic data Prepped and Draped in normal sterile fashion Wound explored  Laceration Location: Posterior scalp Laceration Length: 2.5 cm No Foreign Bodies seen or palpated Amount of cleaning: standard  Skin closure: approximated 2 staples   Technique: Interrupted   Patient tolerance: Patient tolerated the procedure well with no immediate complications.   Labs Review Labs Reviewed  CBC WITH DIFFERENTIAL - Abnormal; Notable for the following:    RBC 3.55 (*)    Hemoglobin 11.2 (*)    HCT 33.9 (*)    Platelets 148 (*)    Monocytes Relative 18 (*)    All other components within normal limits  BASIC METABOLIC PANEL - Abnormal; Notable for the following:    Glucose, Bld 112 (*)    GFR calc non Af Amer 75 (*)    GFR calc Af Amer 87 (*)    All other components within normal limits  URINALYSIS, ROUTINE W REFLEX MICROSCOPIC    Imaging Review Dg Chest 1 View  01/01/2014   CLINICAL DATA:  Chest pain  EXAM: CHEST - 1 VIEW  COMPARISON:  Prior radiograph from 12/15/2013  FINDINGS: Cardiomegaly is stable as compared to prior study. Atherosclerotic calcifications present within the aortic arch.  The lungs are normally inflated. Irregular biapical pleural thickening is unchanged. Chronic coarsening of the interstitial markings  also stable. No airspace consolidation or pulmonary edema. No definite pleural effusion, although the left costophrenic angle is incompletely visualized. There is no pneumothorax.  No acute osseous abnormality identified. Remote right-sided rib fractures again noted.  IMPRESSION: Stable appearance of the chest with no acute cardiopulmonary abnormality identified.   Electronically Signed   By: Jeannine Boga M.D.   On: 01/01/2014 05:27   Dg Hip Complete Right  01/01/2014   CLINICAL DATA:  Fall with right hip pain  EXAM: RIGHT HIP - COMPLETE 2+ VIEW  COMPARISON:  None.  FINDINGS: Acute subcapital right femoral neck fracture with varus angulation. The femoral head remains located. No evidence of pelvic ring or left hip fracture. 3.3 cm calcified mass in the right pelvis, usually from fibroid degeneration.  IMPRESSION: Subcapital right femoral neck fracture.   Electronically Signed   By: Jorje Guild M.D.   On: 01/01/2014 05:13   Ct Head Wo Contrast  01/01/2014   CLINICAL DATA:  Fall with head injury  EXAM: CT HEAD WITHOUT CONTRAST  CT CERVICAL SPINE WITHOUT CONTRAST  TECHNIQUE:  Multidetector CT imaging of the head and cervical spine was performed following the standard protocol without intravenous contrast. Multiplanar CT image reconstructions of the cervical spine were also generated.  COMPARISON:  12/15/2013  FINDINGS: CT HEAD FINDINGS  Skull and Sinuses:No significant abnormality.  Orbits: No acute abnormality.  Brain: No evidence of acute abnormality, such as acute infarction, hemorrhage, hydrocephalus, or mass lesion/mass effect. Mild chronic small vessel disease for age, with ischemic gliosis confluent around the lateral ventricles. Generalized cerebral volume loss, age congruent. Calcified intracranial atherosclerosis.  CT CERVICAL SPINE FINDINGS  No evidence of acute fracture or traumatic malalignment. Multilevel degenerative disc disease, with maximal disc narrowing and endplate degeneration at  C3-4. Facet osteoarthritis with spurring and sclerosis most notable in the upper cervical region, resulting in C3-4 facet ankylosis on the right. No prevertebral edema or gross cervical canal hematoma. No high-grade spinal canal stenosis. Biapical pleural parenchymal scarring.  IMPRESSION: No acute intracranial or cervical spine injury.   Electronically Signed   By: Jorje Guild M.D.   On: 01/01/2014 05:24   Ct Cervical Spine Wo Contrast  01/01/2014   CLINICAL DATA:  Fall with head injury  EXAM: CT HEAD WITHOUT CONTRAST  CT CERVICAL SPINE WITHOUT CONTRAST  TECHNIQUE: Multidetector CT imaging of the head and cervical spine was performed following the standard protocol without intravenous contrast. Multiplanar CT image reconstructions of the cervical spine were also generated.  COMPARISON:  12/15/2013  FINDINGS: CT HEAD FINDINGS  Skull and Sinuses:No significant abnormality.  Orbits: No acute abnormality.  Brain: No evidence of acute abnormality, such as acute infarction, hemorrhage, hydrocephalus, or mass lesion/mass effect. Mild chronic small vessel disease for age, with ischemic gliosis confluent around the lateral ventricles. Generalized cerebral volume loss, age congruent. Calcified intracranial atherosclerosis.  CT CERVICAL SPINE FINDINGS  No evidence of acute fracture or traumatic malalignment. Multilevel degenerative disc disease, with maximal disc narrowing and endplate degeneration at C3-4. Facet osteoarthritis with spurring and sclerosis most notable in the upper cervical region, resulting in C3-4 facet ankylosis on the right. No prevertebral edema or gross cervical canal hematoma. No high-grade spinal canal stenosis. Biapical pleural parenchymal scarring.  IMPRESSION: No acute intracranial or cervical spine injury.   Electronically Signed   By: Jorje Guild M.D.   On: 01/01/2014 05:24     EKG Interpretation   Date/Time:  Sunday Jan 01 2014 05:32:31 EDT Ventricular Rate:  101 PR Interval:   156 QRS Duration: 89 QT Interval:  383 QTC Calculation: 496 R Axis:   84 Text Interpretation:  Sinus tachycardia Atrial premature complexes  Confirmed by Jerry Haugen  MD, Lakeita Panther ([id number]) on 01/01/2014 7:14:46 AM      MDM   Final diagnoses:  Fracture of femoral neck, right  Fall   Patient with likely mechanical fall secondary to dementia/age. Repeat sentinel dose given to improve pain in ER. Suspect right hip fracture.  X-ray reviewed by myself and showed right femoral neck hip fracture. Discussed with [facility] for admission and also orthopedic on call for consult. Plan for likely surgery later today. Patient kept n.p.o. and updated family and patient.  CT head and neck unremarkable, C. collar removed. Staples placed and scalp laceration. Tetanus shot given in IV fluids given.  The patients results and plan were reviewed and discussed.   Any x-rays performed were personally reviewed by myself.   Differential diagnosis were considered with the presenting HPI.  Filed Vitals:   01/01/14 0532 01/01/14 0600 01/01/14 0615 01/01/14 0630  BP: 156/73 152/62  155/71  Pulse: 101  99 102  Temp:      TempSrc:      Resp: 22 20 18 19   Weight:      SpO2: 95% 95% 97% 98%    Admission/ observation were discussed with the admitting physician, patient and/or family and they are comfortable with the plan.    Mariea Clonts, MD 01/01/14 [phone]

## 2014-01-01 NOTE — Progress Notes (Signed)
Utilization review Completed Claudius Mich RN BSN   

## 2014-01-02 ENCOUNTER — Encounter (HOSPITAL_COMMUNITY): Payer: Self-pay | Admitting: Orthopedic Surgery

## 2014-01-02 DIAGNOSIS — D72829 Elevated white blood cell count, unspecified: Secondary | ICD-10-CM | POA: Diagnosis not present

## 2014-01-02 DIAGNOSIS — S72009A Fracture of unspecified part of neck of unspecified femur, initial encounter for closed fracture: Secondary | ICD-10-CM | POA: Diagnosis not present

## 2014-01-02 DIAGNOSIS — D62 Acute posthemorrhagic anemia: Secondary | ICD-10-CM

## 2014-01-02 LAB — CBC
HCT: 27.5 % — ABNORMAL LOW (ref 36.0–46.0)
Hemoglobin: 9.2 g/dL — ABNORMAL LOW (ref 12.0–15.0)
MCH: 31.6 pg (ref 26.0–34.0)
MCHC: 33.5 g/dL (ref 30.0–36.0)
MCV: 94.5 fL (ref 78.0–100.0)
PLATELETS: 136 10*3/uL — AB (ref 150–400)
RBC: 2.91 MIL/uL — ABNORMAL LOW (ref 3.87–5.11)
RDW: 13 % (ref 11.5–15.5)
WBC: 12.5 10*3/uL — AB (ref 4.0–10.5)

## 2014-01-02 LAB — BASIC METABOLIC PANEL
BUN: 12 mg/dL (ref 6–23)
CALCIUM: 8 mg/dL — AB (ref 8.4–10.5)
CO2: 26 mEq/L (ref 19–32)
Chloride: 104 mEq/L (ref 96–112)
Creatinine, Ser: 0.61 mg/dL (ref 0.50–1.10)
GFR calc Af Amer: >90 mL/min (ref >90)
GFR calc non Af Amer: 80 mL/min — ABNORMAL LOW (ref >90)
Glucose, Bld: 113 mg/dL — ABNORMAL HIGH (ref 70–99)
POTASSIUM: 4 meq/L (ref 3.7–5.3)
SODIUM: 140 meq/L (ref 137–147)

## 2014-01-02 LAB — VITAMIN D 25 HYDROXY (VIT D DEFICIENCY, FRACTURES): VIT D 25 HYDROXY: 24 ng/mL — AB (ref 30–89)

## 2014-01-02 MED ORDER — ACETAMINOPHEN 325 MG PO TABS
650.0000 mg | ORAL_TABLET | Freq: Four times a day (QID) | ORAL | Status: DC | PRN
  Administered 2014-01-04: 650 mg via ORAL
  Filled 2014-01-02: qty 2

## 2014-01-02 MED ORDER — MORPHINE SULFATE 2 MG/ML IJ SOLN
1.0000 mg | INTRAMUSCULAR | Status: DC | PRN
  Filled 2014-01-02 (×2): qty 1

## 2014-01-02 MED ORDER — HYDROCODONE-ACETAMINOPHEN 5-325 MG PO TABS: 1.0000 | ORAL_TABLET | Freq: Four times a day (QID) | ORAL | Status: DC | PRN

## 2014-01-02 MED ORDER — POLYVINYL ALCOHOL 1.4 % OP SOLN: 1.0000 [drp] | OPHTHALMIC | Status: DC | PRN

## 2014-01-02 NOTE — Clinical Social Work Psychosocial (Signed)
Clinical Social Work Department BRIEF PSYCHOSOCIAL ASSESSMENT 01/02/2014  Patient:  [name], [name]     Account Number:  [id number]     Admit date:  01/01/2014  Clinical Social Worker:  Wyatt Haste  Date/Time:  01/02/2014 01:05 PM  Referred by:  Physician  Date Referred:  01/02/2014 Referred for  SNF Placement   Other Referral:   Interview type:  Patient Other interview type:   [name]- great niece    PSYCHOSOCIAL DATA Living Status:  OTHER Admitted from facility:   Level of care:   Primary support name:  [name] Primary support relationship to patient:  FAMILY Degree of support available:   supportive    CURRENT CONCERNS Current Concerns  Post-Acute Placement   Other Concerns:    SOCIAL WORK ASSESSMENT / PLAN CSW met with pt and several family members at bedside. Pt alert and very pleasant, but confused. Her great niece, [name] is her HCPOA. CSW spoke with [name] who reports pt lives at home with 24 hour caregivers who are CNAs, family, and friends. She had part time assist at home until about a month ago when they felt that pt needed around the clock supervision. Her need for caregivers was primarily due to dementia. [name] reports that she had an increased number of falls, had left the stove on, and locked herself out of her home. At baseline, pt ambulates independently inside the home, but tends to furniture walk. Pt was getting up to go to the bathroom when she fell, hitting her head and hip. Admitted with hip fracture and surgery was yesterday. Pt worked with PT today and recommendation is for SNF. CSW discussed placement process with [name] who has been anticipating this. SNF list left in room. Aware of Medicare coverage/criteria. Family requesting [facility] if possible, but intend for pt to return home once rehab is completed.   Assessment/plan status:  Psychosocial Support/Ongoing Assessment of Needs Other assessment/ plan:   Information/referral to community  resources:   SNF list    PATIENT'S/FAMILY'S RESPONSE TO PLAN OF CARE: Pt very pleasant, but confused. Pt's niece, [name] states she will be out of town until tomorrow evening, but able to be reached by phone. CSW will initiate bed search and follow up with bed offers when available.       [name], Gary

## 2014-01-02 NOTE — Anesthesia Postprocedure Evaluation (Signed)
  Anesthesia Post-op Note  Patient: [name]  Procedure(s) Performed: Procedure(s): BIPOLAR ARTHROPLASTY RIGHT HIP (Right)  Patient Location: Room 302  Anesthesia Type:Spinal  Level of Consciousness: awake, alert  and patient cooperative  Airway and Oxygen Therapy: Patient Spontanous Breathing and Patient connected to nasal cannula oxygen  Post-op Pain: mild  Post-op Assessment: Post-op Vital signs reviewed, Patient's Cardiovascular Status Stable, Respiratory Function Stable, Patent Airway and Pain level controlled  Post-op Vital Signs: Reviewed and stable  Last Vitals:  Filed Vitals:   01/02/14 0554  BP: 148/71  Pulse: 96  Temp: 36.5 C  Resp: 16    Complications: No apparent anesthesia complications

## 2014-01-02 NOTE — Progress Notes (Signed)
Patient ID: [name], female   DOB: Apr 21, 1925, 80 y.o.   MRN: [id number]  Postop day 1 status post right hip partial replacement.  BP 148/71  Pulse 96  Temp(Src) 97.7 F (36.5 C) (Oral)  Resp 16  Ht 5\' 2"  (1.575 m)  Wt 125 lb (56.7 kg)  BMI 22.86 kg/m2  SpO2 100%  She is much more comfortable today she is awake and alert  Her dressing is dry she is a good distal pulse and normal motion in her right foot.  I'm going to go head and recommend she get a unit of blood for hemoglobin of 9.2 for acute blood loss anemia postop.  She can start physical therapy weightbearing as tolerated with direct lateral approach precautions  BMET    Component Value Date/Time   NA 140 01/02/2014 0520   K 4.0 01/02/2014 0520   CL 104 01/02/2014 0520   CO2 26 01/02/2014 0520   GLUCOSE 113* 01/02/2014 0520   BUN 12 01/02/2014 0520   CREATININE 0.61 01/02/2014 0520   CALCIUM 8.0* 01/02/2014 0520   GFRNONAA 80* 01/02/2014 0520   GFRAA >90 01/02/2014 0520    Hemoglobin & Hematocrit     Component Value Date/Time   HGB 9.2* 01/02/2014 0520   HCT 27.5* 01/02/2014 0520

## 2014-01-02 NOTE — Progress Notes (Signed)
Patient seen, independently examined and chart reviewed. I agree with exam, assessment and plan discussed with Dyanne Carrel, NP.  Subjective: S/p surgery yesterday. Feels better today, pain better controlled. No CP, SOB, nausea, vomiting. No other complaints.  Objective:  Gen. Appears calm, sitting in chair, comfortable  Psych. Grossly normal mood and affected  Cardiovascular. RRR no m/r/g. No LE edema  Respiratory. CTA bilaterally no w/r/r. Nl resp effort   I/O: excellent UOP  Chemistry: BMP unremarkable  Heme: Hgb down to 9.2 post op  Doing well s/p hip surgery 5/31. Anticipate transfer to SNF when cleared by orthopedics.  Murray Hodgkins, MD [facility] [phone]

## 2014-01-02 NOTE — Evaluation (Signed)
Occupational Therapy Evaluation Patient Details Name: [name] MRN: [id number] DOB: 06-Feb-1925 Today's Date: 01/02/2014    History of Present Illness 80 year old female who  has a past medical history of Dementia and Osteoarthritis. today was brought to the ED after patient sustained a mechanical fall at home. As per patient's niece, patient got up from the bed to go to the bathroom lost her balance and fell backwards hitting her head and right hip. Patient experienced pain in the right hip, she has 24 hour caregivers, who informed the niece. Patient was brought to the ED and found right subcapital femoral neck fracture. Pt is s/p righ bipolar hip arthroplasty.   Clinical Impression   Patient seen for a co-tx evaluation with PT due to complexity of case. 2 person assist needed for safety during transfer. Patient was agreeable to evaluation. Needed max vc's for technique and instructions during evaluation. This is baseline. Recommend SNF before returning home with 24 hours assistance.     Follow Up Recommendations  SNF;Supervision/Assistance - 24 hour    Equipment Recommendations  Other (comment) (TBD)       Precautions / Restrictions Precautions Precautions: Fall Precaution Comments: direct lateral hip approach Restrictions Weight Bearing Restrictions: Yes RLE Weight Bearing: Weight bearing as tolerated Other Position/Activity Restrictions: abd pillow in bed      Mobility Bed Mobility Overal bed mobility: Needs Assistance Bed Mobility: Supine to Sit     Supine to sit: +2 for physical assistance     General bed mobility comments: max cues for technique and hand placement, pt had some difficulty following multi step commands, pt needed visual and tactile cues and increased time to initiate. Pt was very rigid and demonstrated a posterior lean with sitting EOB.  Transfers Overall transfer level: Needs assistance Equipment used: Rolling walker (2 wheeled) Transfers: Sit  to/from [facility] Sit to Stand: +2 physical assistance Stand pivot transfers: +2 physical assistance       General transfer comment: max cues for technique, cues to increase forward trunk flexion to assist with standing, pt needed tactile and verbal cues to assist with step and sequencing to complete pivot to the chair. Pt  required +2 for safety.    Balance Overall balance assessment: Needs assistance Sitting-balance support: Feet supported Sitting balance-Leahy Scale: Poor Sitting balance - Comments: posterior lean, no attempt to prevent a fal backwards, difficulty keeping feet on the floor. Postural control: Posterior lean Standing balance support: Bilateral upper extremity supported Standing balance-Leahy Scale: Poor                              ADL Overall ADL's : Needs assistance/impaired Eating/Feeding: Supervision/ safety;Bed level           Lower Body Bathing: Total assistance;+2 for physical assistance;Sit to/from stand   Upper Body Dressing : Total assistance;Sitting                     General ADL Comments: Total Assist for all BADL tasks.                Pertinent Vitals/Pain FACES 4 right hip        Extremity/Trunk Assessment Upper Extremity Assessment Upper Extremity Assessment: Generalized weakness   Lower Extremity Assessment Lower Extremity Assessment: Defer to PT evaluation RLE Deficits / Details: ankle 3/5, knee/hip 2/5 due to pain RLE: Unable to fully assess due to pain  Communication Communication Communication: No difficulties   Cognition Arousal/Alertness: Awake/alert Behavior During Therapy: WFL for tasks assessed/performed Overall Cognitive Status: History of cognitive impairments - at baseline                                Home Living Family/patient expects to be discharged to:: [place]: Alone;Other (Comment) (has 24 hr. hired help)    Type of Home: House Home Access: Stairs to enter [employer] of Steps: 1   Home Layout: One level               Home Equipment: Environmental consultant - 2 wheels;Cane - single point          Prior Functioning/Environment Level of Independence: Needs assistance  Gait / Transfers Assistance Needed: supervision with cane basic mobility in the house          OT Diagnosis: Generalized weakness;Acute pain   OT Problem List: Decreased strength;Decreased activity tolerance;Impaired balance (sitting and/or standing);Decreased safety awareness;Pain   OT Treatment/Interventions: Self-care/ADL training;Therapeutic exercise;DME and/or AE instruction;Patient/family education;Therapeutic activities;Balance training    OT Goals(Current goals can be found in the care plan section) Acute Rehab OT Goals OT Goal Formulation: With patient Time For Goal Achievement: 01/16/14 Potential to Achieve Goals: Good  OT Frequency: Min 2X/week   Barriers to D/C:  None          Co-evaluation PT/OT/SLP Co-Evaluation/Treatment: Yes Reason for Co-Treatment: Complexity of the patient's impairments (multi-system involvement) PT goals addressed during session: Mobility/safety with mobility;Balance;Proper use of DME;Strengthening/ROM OT goals addressed during session: ADL's and self-care;Strengthening/ROM      End of Session Equipment Utilized During Treatment: Gait belt;Rolling walker  Activity Tolerance: Patient tolerated treatment well Patient left: in chair;with call bell/phone within reach;with chair alarm set;with family/visitor present   Time: 0930-1004 OT Time Calculation (min): 34 min Charges:  OT General Charges $OT Visit: 1 Procedure OT Evaluation $Initial OT Evaluation Tier I: 1 Procedure  Ailene Ravel, OTR/L,CBIS   01/02/2014, 11:02 AM

## 2014-01-02 NOTE — Clinical Social Work Placement (Signed)
Clinical Social Work Department CLINICAL SOCIAL WORK PLACEMENT NOTE 01/02/2014  Patient:  [name], [name]  Account Number:  [id number] Admit date:  01/01/2014  Clinical Social Worker:  Benay Pike, LCSW  Date/time:  01/02/2014 01:04 PM  Clinical Social Work is seeking post-discharge placement for this patient at the following level of care:   [place]   (*CSW will update this form in Epic as items are completed)   01/02/2014  Patient/family provided with [facility] of Clinical Social Work's list of facilities offering this level of care within the geographic area requested by the patient (or if unable, by the patient's family).  01/02/2014  Patient/family informed of their freedom to choose among providers that offer the needed level of care, that participate in Medicare, Medicaid or managed care program needed by the patient, have an available bed and are willing to accept the patient.  01/02/2014  Patient/family informed of [facility]' ownership interest in [facility], as well as of the fact that they are under no obligation to receive care at this facility.  PASARR submitted to EDS on 01/02/2014 PASARR number received from EDS on 01/02/2014  FL2 transmitted to all facilities in geographic area requested by pt/family on  01/02/2014 FL2 transmitted to all facilities within larger geographic area on   Patient informed that his/her managed care company has contracts with or will negotiate with  certain facilities, including the following:     Patient/family informed of bed offers received:   Patient chooses bed at  Physician recommends and patient chooses bed at    Patient to be transferred to  on   Patient to be transferred to facility by   The following physician request were entered in Epic:   Additional Comments:  Benay Pike, [place]

## 2014-01-02 NOTE — Plan of Care (Signed)
Problem: Phase II Progression Outcomes Goal: Tolerating diet Outcome: Completed/Met Date Met:  01/02/14 No complaints of nausea, vomiting, abd pain etc.

## 2014-01-02 NOTE — Care Management Note (Addendum)
    Page 1 of 1   01/06/2014     11:54:55 AM CARE MANAGEMENT NOTE 01/06/2014  Patient:  [name]   Account Number:  [id number]  Date Initiated:  01/02/2014  Documentation initiated by:  Theophilus Kinds  Subjective/Objective Assessment:   Pt admitted from home s/p fall and hip fracture. Pt lives at home and has 24 hour caregiver support.     Action/Plan:   PT recommends SNF at discharge. CSW is aware and will collect the pts information from the chart.   Anticipated DC Date:  01/04/2014   Anticipated DC Plan:  SKILLED NURSING FACILITY  In-house referral  Clinical Social Worker      DC Planning Services  CM consult      Choice offered to / List presented to:             Status of service:  Completed, signed off Medicare Important Message given?  YES (If response is "NO", the following Medicare IM given date fields will be blank) Date Medicare IM given:  01/06/2014 Date Additional Medicare IM given:    Discharge Disposition:  [name]  Per UR Regulation:    If discussed at Long Length of Stay Meetings, dates discussed:    Comments:  01/06/14 [place], RN BSN CM Pt discharged to [facility] today. CSW to arrange discharge to facility.  01/02/14 [place], RN BSN CM

## 2014-01-02 NOTE — Evaluation (Signed)
Physical Therapy Evaluation Patient Details Name: [name] MRN: [id number] DOB: 1925/07/23 Today's Date: 01/02/2014   History of Present Illness  80 year old female who  has a past medical history of Dementia and Osteoarthritis. today was brought to the ED after patient sustained a mechanical fall at home. As per patient's niece, patient got up from the bed to go to the bathroom lost her balance and fell backwards hitting her head and right hip. Patient experienced pain in the right hip, she has 24 hour caregivers, who informed the niece. Patient was brought to the ED and found right subcapital femoral neck fracture. Pt is s/p righ bipolar hip arthroplasty.  Clinical Impression  Pt presents with dependencies in mobility affecting her Independence due to a recent fall and Right hip fracture. Pt has some difficulty following motor commands with mobility and demonstrated decreased attention. Pt had 24 hour assistance at home from hired help. Pt would benefit from skilled PT to maximize mobility and Independence for next venue of care. Recommend short term SNF placement.    Follow Up Recommendations SNF    Equipment Recommendations  None recommended by PT    Recommendations for Other Services       Precautions / Restrictions Precautions Precautions: Fall Precaution Comments: direct lateral hip approach Restrictions RLE Weight Bearing: Weight bearing as tolerated Other Position/Activity Restrictions: abd pillow in bed      Mobility  Bed Mobility Overal bed mobility: Needs Assistance Bed Mobility: Supine to Sit     Supine to sit: +2 for physical assistance     General bed mobility comments: max cues for technique and hand placement, pt had some difficulty following multi step commands, pt needed visual and tactile cues and increased time to initiate. Pt was very rigid and demonstrated a posterior lean with sitting EOB.  Transfers Overall transfer level: Needs  assistance Equipment used: Rolling walker (2 wheeled) Transfers: Sit to/from [facility] Sit to Stand: +2 physical assistance Stand pivot transfers: +2 physical assistance       General transfer comment: max cues for technique, cues to increase forward trunk flexion to assist with standing, pt needed tactile and verbal cues to assist with step and sequencing to complete pivot to the chair. Pt  required +2 for safety.  Ambulation/Gait                Stairs            Wheelchair Mobility    Modified Rankin (Stroke Patients Only)       Balance Overall balance assessment: Needs assistance Sitting-balance support: Feet supported Sitting balance-Leahy Scale: Poor Sitting balance - Comments: posterior lean, no attempt to prevent a fal backwards, difficulty keeping feet on the floor. Postural control: Posterior lean Standing balance support: Bilateral upper extremity supported Standing balance-Leahy Scale: Poor                               Pertinent Vitals/Pain     Home Living Family/patient expects to be discharged to:: [place]: Alone;Other (Comment) (has 24 hr. hired help)   Type of Home: House Home Access: Stairs to enter   [employer] of Steps: 1 Home Layout: One level Home Equipment: Environmental consultant - 2 wheels;Cane - single point      Prior Function Level of Independence: Needs assistance   Gait / Transfers Assistance Needed: supervision with cane basic mobility in the house  Hand Dominance        Extremity/Trunk Assessment   Upper Extremity Assessment: Defer to OT evaluation           Lower Extremity Assessment: RLE deficits/detail RLE Deficits / Details: ankle 3/5, knee/hip 2/5 due to pain       Communication   Communication: No difficulties  Cognition Arousal/Alertness: Awake/alert Behavior During Therapy: WFL for tasks assessed/performed Overall  Cognitive Status: History of cognitive impairments - at baseline                      General Comments      Exercises Total Joint Exercises Ankle Circles/Pumps: AROM;Both;Strengthening;5 reps;Supine Heel Slides: AAROM;Strengthening;Right;5 reps;Supine      Assessment/Plan    PT Assessment Patient needs continued PT services  PT Diagnosis Difficulty walking;Acute pain   PT Problem List Decreased strength;Decreased range of motion;Decreased activity tolerance;Decreased balance;Decreased mobility;Decreased knowledge of use of DME;Decreased safety awareness;Decreased knowledge of precautions  PT Treatment Interventions DME instruction;Gait training;Functional mobility training;Therapeutic activities;Therapeutic exercise;Balance training;Patient/family education   PT Goals (Current goals can be found in the Care Plan section) Acute Rehab PT Goals PT Goal Formulation: With family Time For Goal Achievement: 01/09/14 Potential to Achieve Goals: Good    Frequency Min 5X/week   Barriers to discharge        Co-evaluation PT/OT/SLP Co-Evaluation/Treatment: Yes Reason for Co-Treatment: For patient/therapist safety PT goals addressed during session: Mobility/safety with mobility;Balance;Proper use of DME;Strengthening/ROM         End of Session Equipment Utilized During Treatment: Gait belt Activity Tolerance: Patient tolerated treatment well Patient left: in chair;with call bell/phone within reach Nurse Communication: Mobility status         Time: 0930-1005 PT Time Calculation (min): 35 min   Charges:   PT Evaluation $Initial PT Evaluation Tier I: 1 Procedure PT Treatments $Therapeutic Activity: 8-22 mins   PT G Codes:          Lelon Mast 01/02/2014, 10:27 AM

## 2014-01-02 NOTE — Addendum Note (Signed)
Addendum created 01/02/14 0935 by Mickel Baas, CRNA   Modules edited: Notes Section   Notes Section:  File: [id number]

## 2014-01-02 NOTE — Progress Notes (Signed)
[facility] PROGRESS NOTE  KRISTOPHER DELK OZD:[id number] DOB: [birth year] DOA: 01/01/2014 PCP: Purvis Kilts, MD  Summary:  80 year old woman with a complicated past medical history consisting of dementia and osteoarthritis who presented to the emergency department after a mechanical fall at home when she lost her balance and fell backwards hitting her head and landing on her right hip. Imaging revealed right femoral neck fracture. CT of the head and neck were unremarkable the patient was admitted for orthopedic consultation and treatment.   Assessment/Plan: Subcapital right hip fracture status post mechanical fall. Plan for operative intervention per orthopedics.  DT prophylaxis per orthopedics.  Chest discomfort secondary to previous fall 5/14. Appears to have resolved. No history of cardiac disease.   Dementia. Appears to be stable at baseline.   Acute blood loss anemia: related to surgery. Will transfuse 1 unit PRBC's per ortho recommendation. Will decrease IV fluid rate as patient taking po's well  Leukocytosis: mild. Likely reactive related to #1. She is afebrile and non-toxic appearing. Will monitor.     Code Status: full Family Communication: none at bedside Disposition Plan: SNF when reade   Consultants:  Dr Aline Brochure orthopedics  Procedures:  01/01/14 right hip partial replacement  Antibiotics:    HPI/Subjective: Sitting up in bed eating breakfast. Reports feeling "much better today". Pain controlled  Objective: Filed Vitals:   01/02/14 0554  BP: 148/71  Pulse: 96  Temp: 97.7 F (36.5 C)  Resp: 16    Intake/Output Summary (Last 24 hours) at 01/02/14 0918 Last data filed at 01/02/14 0600  Gross per 24 hour  Intake   1000 ml  Output   1650 ml  Net   -650 ml   Filed Weights   01/01/14 0330 01/01/14 0813  Weight: 52.164 kg (115 lb) 56.7 kg (125 lb)    Exam:   General:  Appears comfortable  Cardiovascular: RRR No MGR No LE  edema  Respiratory: normal effort BS clear bilaterally no wheeze no rhonchi  Abdomen: soft +BS Non-tender to palpation  Musculoskeletal: right hip with dressing that is dry and intact. Right foot warm to touch. Other joints without swelling/erythema   Data Reviewed: Basic Metabolic Panel:  Recent Labs Lab 01/01/14 0403 01/02/14 0520  NA 142 140  K 3.9 4.0  CL 104 104  CO2 28 26  GLUCOSE 112* 113*  BUN 15 12  CREATININE 0.74 0.61  CALCIUM 8.8 8.0*   Liver Function Tests: No results found for this basename: AST, ALT, ALKPHOS, BILITOT, PROT, ALBUMIN,  in the last 168 hours No results found for this basename: LIPASE, AMYLASE,  in the last 168 hours No results found for this basename: AMMONIA,  in the last 168 hours CBC:  Recent Labs Lab 01/01/14 0403 01/02/14 0520  WBC 4.4 12.5*  NEUTROABS 2.9  --   HGB 11.2* 9.2*  HCT 33.9* 27.5*  MCV 95.5 94.5  PLT 148* 136*   Cardiac Enzymes: No results found for this basename: CKTOTAL, CKMB, CKMBINDEX, TROPONINI,  in the last 168 hours BNP (last 3 results) No results found for this basename: PROBNP,  in the last 8760 hours CBG: No results found for this basename: GLUCAP,  in the last 168 hours  Recent Results (from the past 240 hour(s))  SURGICAL PCR SCREEN     Status: None   Collection Time    01/01/14 11:25 AM      Result Value Ref Range Status   MRSA, PCR NEGATIVE  NEGATIVE Final   Staphylococcus  aureus NEGATIVE  NEGATIVE Final   Comment:            The Xpert SA Assay (FDA     approved for NASAL specimens     in patients over 51 years of age),     is one component of     a comprehensive surveillance     program.  Test performance has     been validated by [employer] for patients greater     than or equal to 63 year old.     It is not intended     to diagnose infection nor to     guide or monitor treatment.     Studies: Dg Chest 1 View  01/01/2014   CLINICAL DATA:  Chest pain  EXAM: CHEST - 1 VIEW   COMPARISON:  Prior radiograph from 12/15/2013  FINDINGS: Cardiomegaly is stable as compared to prior study. Atherosclerotic calcifications present within the aortic arch.  The lungs are normally inflated. Irregular biapical pleural thickening is unchanged. Chronic coarsening of the interstitial markings also stable. No airspace consolidation or pulmonary edema. No definite pleural effusion, although the left costophrenic angle is incompletely visualized. There is no pneumothorax.  No acute osseous abnormality identified. Remote right-sided rib fractures again noted.  IMPRESSION: Stable appearance of the chest with no acute cardiopulmonary abnormality identified.   Electronically Signed   By: Jeannine Boga M.D.   On: 01/01/2014 05:27   Dg Hip Complete Right  01/01/2014   CLINICAL DATA:  Fall with right hip pain  EXAM: RIGHT HIP - COMPLETE 2+ VIEW  COMPARISON:  None.  FINDINGS: Acute subcapital right femoral neck fracture with varus angulation. The femoral head remains located. No evidence of pelvic ring or left hip fracture. 3.3 cm calcified mass in the right pelvis, usually from fibroid degeneration.  IMPRESSION: Subcapital right femoral neck fracture.   Electronically Signed   By: Jorje Guild M.D.   On: 01/01/2014 05:13   Ct Head Wo Contrast  01/01/2014   CLINICAL DATA:  Fall with head injury  EXAM: CT HEAD WITHOUT CONTRAST  CT CERVICAL SPINE WITHOUT CONTRAST  TECHNIQUE: Multidetector CT imaging of the head and cervical spine was performed following the standard protocol without intravenous contrast. Multiplanar CT image reconstructions of the cervical spine were also generated.  COMPARISON:  12/15/2013  FINDINGS: CT HEAD FINDINGS  Skull and Sinuses:No significant abnormality.  Orbits: No acute abnormality.  Brain: No evidence of acute abnormality, such as acute infarction, hemorrhage, hydrocephalus, or mass lesion/mass effect. Mild chronic small vessel disease for age, with ischemic gliosis  confluent around the lateral ventricles. Generalized cerebral volume loss, age congruent. Calcified intracranial atherosclerosis.  CT CERVICAL SPINE FINDINGS  No evidence of acute fracture or traumatic malalignment. Multilevel degenerative disc disease, with maximal disc narrowing and endplate degeneration at C3-4. Facet osteoarthritis with spurring and sclerosis most notable in the upper cervical region, resulting in C3-4 facet ankylosis on the right. No prevertebral edema or gross cervical canal hematoma. No high-grade spinal canal stenosis. Biapical pleural parenchymal scarring.  IMPRESSION: No acute intracranial or cervical spine injury.   Electronically Signed   By: Jorje Guild M.D.   On: 01/01/2014 05:24   Ct Cervical Spine Wo Contrast  01/01/2014   CLINICAL DATA:  Fall with head injury  EXAM: CT HEAD WITHOUT CONTRAST  CT CERVICAL SPINE WITHOUT CONTRAST  TECHNIQUE: Multidetector CT imaging of the head and cervical spine was performed following the standard protocol  without intravenous contrast. Multiplanar CT image reconstructions of the cervical spine were also generated.  COMPARISON:  12/15/2013  FINDINGS: CT HEAD FINDINGS  Skull and Sinuses:No significant abnormality.  Orbits: No acute abnormality.  Brain: No evidence of acute abnormality, such as acute infarction, hemorrhage, hydrocephalus, or mass lesion/mass effect. Mild chronic small vessel disease for age, with ischemic gliosis confluent around the lateral ventricles. Generalized cerebral volume loss, age congruent. Calcified intracranial atherosclerosis.  CT CERVICAL SPINE FINDINGS  No evidence of acute fracture or traumatic malalignment. Multilevel degenerative disc disease, with maximal disc narrowing and endplate degeneration at C3-4. Facet osteoarthritis with spurring and sclerosis most notable in the upper cervical region, resulting in C3-4 facet ankylosis on the right. No prevertebral edema or gross cervical canal hematoma. No high-grade  spinal canal stenosis. Biapical pleural parenchymal scarring.  IMPRESSION: No acute intracranial or cervical spine injury.   Electronically Signed   By: Jorje Guild M.D.   On: 01/01/2014 05:24   Dg Pelvis Portable  01/01/2014   CLINICAL DATA:  Postoperative radiographs. Right total hip arthroplasty.  EXAM: PORTABLE PELVIS 1-2 VIEWS  COMPARISON:  None.  FINDINGS: There is a new uncomplicated right hip hemiarthroplasty with expected changes in the soft tissues. Calcified fibroid noted. Mild left hip osteoarthritis incidentally noted.  IMPRESSION: Uncomplicated new right hip hemiarthroplasty.   Electronically Signed   By: Dereck Ligas M.D.   On: 01/01/2014 17:35    Scheduled Meds: . acetaminophen  1,000 mg Intravenous 4 times per day  . citalopram  20 mg Oral Daily  . [START ON 01/04/2014] enoxaparin (LOVENOX) injection  40 mg Subcutaneous Q24H  . pneumococcal 23 valent vaccine  0.5 mL Intramuscular Tomorrow-1000  . polyethylene glycol  17 g Oral Daily  . silver sulfADIAZINE  1 application Topical Daily  . traMADol  50 mg Oral 4 times per day   Continuous Infusions: . sodium chloride 100 mL/hr at 01/01/14 1610    Principal Problem:   Femoral neck fracture Active Problems:   Dementia   Depression   Fracture of femoral neck, right, closed   Acute blood loss anemia   Leukocytosis, unspecified    Time spent: 35 minutes    [facility] Pager [phone]. If 7PM-7AM, please contact night-coverage at [email or web address], password [facility] 01/02/2014, 9:18 AM  LOS: 1 day

## 2014-01-03 DIAGNOSIS — S72009A Fracture of unspecified part of neck of unspecified femur, initial encounter for closed fracture: Secondary | ICD-10-CM | POA: Diagnosis not present

## 2014-01-03 DIAGNOSIS — R000 Tachycardia, unspecified: Secondary | ICD-10-CM | POA: Diagnosis not present

## 2014-01-03 DIAGNOSIS — F0390 Unspecified dementia without behavioral disturbance: Secondary | ICD-10-CM | POA: Diagnosis not present

## 2014-01-03 DIAGNOSIS — D696 Thrombocytopenia, unspecified: Secondary | ICD-10-CM | POA: Diagnosis not present

## 2014-01-03 DIAGNOSIS — D62 Acute posthemorrhagic anemia: Secondary | ICD-10-CM | POA: Diagnosis not present

## 2014-01-03 LAB — CBC
HCT: 32.5 % — ABNORMAL LOW (ref 36.0–46.0)
HEMOGLOBIN: 11 g/dL — AB (ref 12.0–15.0)
MCH: 31.5 pg (ref 26.0–34.0)
MCHC: 33.8 g/dL (ref 30.0–36.0)
MCV: 93.1 fL (ref 78.0–100.0)
Platelets: 131 10*3/uL — ABNORMAL LOW (ref 150–400)
RBC: 3.49 MIL/uL — ABNORMAL LOW (ref 3.87–5.11)
RDW: 14.4 % (ref 11.5–15.5)
WBC: 13.4 10*3/uL — ABNORMAL HIGH (ref 4.0–10.5)

## 2014-01-03 LAB — BASIC METABOLIC PANEL
BUN: 13 mg/dL (ref 6–23)
CALCIUM: 8.5 mg/dL (ref 8.4–10.5)
CO2: 26 mEq/L (ref 19–32)
Chloride: 103 mEq/L (ref 96–112)
Creatinine, Ser: 0.61 mg/dL (ref 0.50–1.10)
GFR calc Af Amer: >90 mL/min (ref >90)
GFR, EST NON AFRICAN AMERICAN: 80 mL/min — AB (ref >90)
Glucose, Bld: 105 mg/dL — ABNORMAL HIGH (ref 70–99)
Potassium: 3.9 mEq/L (ref 3.7–5.3)
SODIUM: 138 meq/L (ref 137–147)

## 2014-01-03 MED ORDER — METOPROLOL TARTRATE 25 MG PO TABS
12.5000 mg | ORAL_TABLET | Freq: Two times a day (BID) | ORAL | Status: DC
  Administered 2014-01-03 – 2014-01-04 (×2): 12.5 mg via ORAL
  Filled 2014-01-03 (×2): qty 1

## 2014-01-03 NOTE — Progress Notes (Signed)
Patient ID: [name], female   DOB: [birth year], 80 y.o.   MRN: [id number]  Orthopedic discharge summary  Diagnosis right hip fracture  Procedure right bipolar partial hip replacement  Operating surgeon Dr. Aline Brochure from number [phone] or to the hospital page operator [phone]  Weightbearing status as tolerated  Direct lateral hip precautions  DVT prevention use lovenox 30 mg subq x 28 days from surgery (reduced dose due to size, weight and ebl at surgery)  Surgery date Jan 01, 2014   Followup one month  Abduction pillow in place for 6 weeks  Staples out on postop day 14  Implants [place] basic fracture stem

## 2014-01-03 NOTE — Progress Notes (Signed)
I have seen and assessed patient and agree with Dyanne Carrel, NP assessment and plan. Patient is status post operative repair of subcapital right hip fracture with no complications. Patient currently stable. Follow H&H.

## 2014-01-03 NOTE — Progress Notes (Signed)
Patient ID: [name], female   DOB: Jan 07, 1925, 80 y.o.   MRN: [id number]  Postop day 2 status post right hip bipolar replacement  BP 149/84  Pulse 104  Temp(Src) 98.1 F (36.7 C) (Oral)  Resp 20  Ht 5\' 2"  (1.575 m)  Wt 125 lb (56.7 kg)  BMI 22.86 kg/m2  SpO2 97%  Hemoglobin & Hematocrit     Component Value Date/Time   HGB 11.0* 01/03/2014 0550   HCT 32.5* 01/03/2014 0550    Her blood count is improved to 11.0 after a unit of blood.  She needs intensive physical therapy. She appears to be stabilizing medically and should be discharge tomorrow to a skilled nursing facility if a bed becomes available.

## 2014-01-03 NOTE — Progress Notes (Signed)
[facility] PROGRESS NOTE  [name] WPY:[id number] DOB: 02-04-1925 DOA: 01/01/2014 PCP: Purvis Kilts, MD  Summary:  80 year old woman with a complicated past medical history consisting of dementia and osteoarthritis who presented to the emergency department after a mechanical fall at home when she lost her balance and fell backwards hitting her head and landing on her right hip. Imaging revealed right femoral neck fracture. CT of the head and neck were unremarkable the patient was admitted for orthopedic consultation and treatment.    Assessment/Plan: Subcapital right hip fracture status post mechanical fall. S/P operative intervention per orthopedics on 5/31. Pain management good. PT recommending SNF  DT prophylaxis per orthopedics.   Chest discomfort secondary to previous fall 5/14. Appears to have resolved. No history of cardiac disease. No further episodes  Dementia. Appears to be stable at baseline.   Acute blood loss anemia: related to surgery. Improved S/P 1 unit PRBC's. No s/sx active bleeding.    Leukocytosis: mild. Trending up slightly.  Likely reactive related to #1. She is afebrile and non-toxic appearing. Encourage incentive spirometry. Check urine.  Will monitor.   HTN: borderline. Likely related to pain. Patient reports good relief with pain medication. Will start low dose BB with parameters. No hx HTN.  Monitor closely.   Tachycardia: mild. May be related to pain. Does not appear dehydrated. Low dose BB as above. Monitor closely  Thrombocytopenia: chart review indicates chronic. Current level close to baseline. Will monitor. May benefit OP follow up   Code Status: full Family Communication: niece at bedside Disposition Plan: snf hopefully tomorrow   Consultants:  Dr. Aline Brochure  Procedures:  01/01/14 right hip partial repair  Antibiotics:  none  HPI/Subjective: Sitting up eating. Reports good pain mangement  Objective: Filed Vitals:   01/03/14 0655  BP: 149/84  Pulse: 104  Temp: 98.1 F (36.7 C)  Resp: 20    Intake/Output Summary (Last 24 hours) at 01/03/14 0851 Last data filed at 01/02/14 1729  Gross per 24 hour  Intake    360 ml  Output    200 ml  Net    160 ml   Filed Weights   01/01/14 0330 01/01/14 0813  Weight: 52.164 kg (115 lb) 56.7 kg (125 lb)    Exam:   General:  Well nourished NAD  Cardiovascular: tachycardic but regular no MGR No LE edema  Respiratory: normal effort BS clear bilaterally no wheeze  Abdomen: soft non-distended non-tender to palpation +BS  Musculoskeletal: right hip with dressing dry and intact. Right foot warm to touch   Data Reviewed: Basic Metabolic Panel:  Recent Labs Lab 01/01/14 0403 01/02/14 0520 01/03/14 0550  NA 142 140 138  K 3.9 4.0 3.9  CL 104 104 103  CO2 28 26 26   GLUCOSE 112* 113* 105*  BUN 15 12 13   CREATININE 0.74 0.61 0.61  CALCIUM 8.8 8.0* 8.5   Liver Function Tests: No results found for this basename: AST, ALT, ALKPHOS, BILITOT, PROT, ALBUMIN,  in the last 168 hours No results found for this basename: LIPASE, AMYLASE,  in the last 168 hours No results found for this basename: AMMONIA,  in the last 168 hours CBC:  Recent Labs Lab 01/01/14 0403 01/02/14 0520 01/03/14 0550  WBC 4.4 12.5* 13.4*  NEUTROABS 2.9  --   --   HGB 11.2* 9.2* 11.0*  HCT 33.9* 27.5* 32.5*  MCV 95.5 94.5 93.1  PLT 148* 136* 131*   Cardiac Enzymes: No results found for this basename: CKTOTAL,  CKMB, CKMBINDEX, TROPONINI,  in the last 168 hours BNP (last 3 results) No results found for this basename: PROBNP,  in the last 8760 hours CBG: No results found for this basename: GLUCAP,  in the last 168 hours  Recent Results (from the past 240 hour(s))  SURGICAL PCR SCREEN     Status: None   Collection Time    01/01/14 11:25 AM      Result Value Ref Range Status   MRSA, PCR NEGATIVE  NEGATIVE Final   Staphylococcus aureus NEGATIVE  NEGATIVE Final   Comment:             The Xpert SA Assay (FDA     approved for NASAL specimens     in patients over 74 years of age),     is one component of     a comprehensive surveillance     program.  Test performance has     been validated by [employer] for patients greater     than or equal to 73 year old.     It is not intended     to diagnose infection nor to     guide or monitor treatment.     Studies: Dg Pelvis Portable  01/01/2014   CLINICAL DATA:  Postoperative radiographs. Right total hip arthroplasty.  EXAM: PORTABLE PELVIS 1-2 VIEWS  COMPARISON:  None.  FINDINGS: There is a new uncomplicated right hip hemiarthroplasty with expected changes in the soft tissues. Calcified fibroid noted. Mild left hip osteoarthritis incidentally noted.  IMPRESSION: Uncomplicated new right hip hemiarthroplasty.   Electronically Signed   By: Dereck Ligas M.D.   On: 01/01/2014 17:35    Scheduled Meds: . citalopram  20 mg Oral Daily  . [START ON 01/04/2014] enoxaparin (LOVENOX) injection  40 mg Subcutaneous Q24H  . metoprolol tartrate  12.5 mg Oral BID  . polyethylene glycol  17 g Oral Daily  . silver sulfADIAZINE  1 application Topical Daily  . traMADol  50 mg Oral 4 times per day   Continuous Infusions:   Principal Problem:   Femoral neck fracture Active Problems:   Dementia   Depression   Fracture of femoral neck, right, closed   Acute blood loss anemia   Leukocytosis, unspecified   Thrombocytopenia, unspecified   Sinus tachycardia    Time spent: 35 minutes    [place] [facility] Pager [phone]. If 7PM-7AM, please contact night-coverage at [email or web address], password [facility] 01/03/2014, 8:51 AM  LOS: 2 days

## 2014-01-03 NOTE — Clinical Social Work Note (Signed)
CSW presented bed offer at [facility] and pt's niece [name] accepts. Facility notified. Probable d/c tomorrow.  [name], [place]

## 2014-01-03 NOTE — Progress Notes (Signed)
Physical Therapy Treatment Patient Details Name: [name] MRN: [id number] DOB: 14-Apr-1925 Today's Date: 01/03/2014    History of Present Illness 80 year old female who  has a past medical history of Dementia and Osteoarthritis. today was brought to the ED after patient sustained a mechanical fall at home. As per patient's niece, patient got up from the bed to go to the bathroom lost her balance and fell backwards hitting her head and right hip. Patient experienced pain in the right hip, she has 24 hour caregivers, who informed the niece. Patient was brought to the ED and found right subcapital femoral neck fracture. Pt is s/p righ bipolar hip arthroplasty.    PT Comments    Patient only oriented x1 to name this afternoon, family member states she had pain medication about an hour earlier. Patient was lethargic with decreased cognition and unable to follow commands therefore PROM was performed in supine to RLE and Max A-Total A needed to transfer patient supine<>EOB and to performed standing pivot to recliner with no AD.           Precautions / Restrictions Precautions Precautions: Fall Precaution Comments: direct lateral hip approach Restrictions Weight Bearing Restrictions: Yes RLE Weight Bearing: Weight bearing as tolerated    Mobility  Bed Mobility Overal bed mobility: Needs Assistance Bed Mobility: Supine to Sit     Supine to sit: Max assist;HOB elevated     General bed mobility comments: Max A +1 using bed mat to pull BLEs around while holding trunk upright  Transfers Overall transfer level: Needs assistance Equipment used: None Transfers: Stand Pivot Transfers   Stand pivot transfers: Max assist;Total assist                                                                                        Exercises General Exercises - Lower Extremity Heel Slides: PROM;Right;10 reps;Supine Hip ABduction/ADduction: PROM;Right;10  reps;Supine Other Exercises Other Exercises: sitting EOB for 5' with assistance due to severe posterior lean secondary to c/o of back pain;unable to stay upright on own                         PT Goals (current goals can now be found in the care plan section) Progress towards PT goals: Progressing toward goals                Co-evaluation PT/OT/SLP Co-Evaluation/Treatment: Yes           End of Session   Activity Tolerance: Patient limited by lethargy Patient left: in chair;with chair alarm set;with family/visitor present;with call bell/phone within reach     Time: 7846-9629 PT Time Calculation (min): 35 min  Charges:  $Therapeutic Exercise: 8-22 mins $Therapeutic Activity: 8-22 mins                    G Codes:      Molli Knock 01/03/2014, 2:44 PM

## 2014-01-03 NOTE — Clinical Social Work Placement (Signed)
Clinical Social Work Department CLINICAL SOCIAL WORK PLACEMENT NOTE 01/03/2014  Patient:  [name], [name]  Account Number:  [id number] Admit date:  01/01/2014  Clinical Social Worker:  Benay Pike, LCSW  Date/time:  01/02/2014 01:04 PM  Clinical Social Work is seeking post-discharge placement for this patient at the following level of care:   [place]   (*CSW will update this form in Epic as items are completed)   01/02/2014  Patient/family provided with [place] Department of Clinical Social Work's list of facilities offering this level of care within the geographic area requested by the patient (or if unable, by the patient's family).  01/02/2014  Patient/family informed of their freedom to choose among providers that offer the needed level of care, that participate in Medicare, Medicaid or managed care program needed by the patient, have an available bed and are willing to accept the patient.  01/02/2014  Patient/family informed of [facility]' ownership interest in [facility], as well as of the fact that they are under no obligation to receive care at this facility.  PASARR submitted to EDS on 01/02/2014 PASARR number received from EDS on 01/02/2014  FL2 transmitted to all facilities in geographic area requested by pt/family on  01/02/2014 FL2 transmitted to all facilities within larger geographic area on   Patient informed that his/her managed care company has contracts with or will negotiate with  certain facilities, including the following:     Patient/family informed of bed offers received:  01/03/2014 Patient chooses bed at [facility] Physician recommends and patient chooses bed at  [facility]  Patient to be transferred to  on   Patient to be transferred to facility by   The following physician request were entered in Epic:   Additional Comments:  Benay Pike, [place]

## 2014-01-03 NOTE — Progress Notes (Signed)
Occupational Therapy Treatment Patient Details Name: [name] MRN: [id number] DOB: [birth year] Today's Date: 01/03/2014    History of present illness 80 year old female who  has a past medical history of Dementia and Osteoarthritis. today was brought to the ED after patient sustained a mechanical fall at home. As per patient's niece, patient got up from the bed to go to the bathroom lost her balance and fell backwards hitting her head and right hip. Patient experienced pain in the right hip, she has 24 hour caregivers, who informed the niece. Patient was brought to the ED and found right subcapital femoral neck fracture. Pt is s/p righ bipolar hip arthroplasty.   OT comments  Per niece, pt remains more confused over baseline dementia.  Pt not appropriate for learning lower body dressing techniques this date.  Engage din in bed level ADLs, and pt required grossly mod assist for cueing and hand placement.  SNF remains appropriate at d/c.   Follow Up Recommendations  SNF;Supervision/Assistance - 24 hour    Equipment Recommendations       Recommendations for Other Services      Precautions / Restrictions Precautions Precautions: Fall Precaution Comments: direct lateral hip approach Restrictions Weight Bearing Restrictions: Yes RLE Weight Bearing: Weight bearing as tolerated       Mobility Bed Mobility                  Transfers                      Balance                                   ADL   Eating/Feeding: Supervision/ safety;Bed level;Cueing for sequencing Eating/Feeding Details (indicate cue type and reason): Multiple cues to take sip of water.  Assist in holding onto cup. Grooming: Wash/dry face;Brushing hair;Moderate assistance;Cueing for sequencing Grooming Details (indicate cue type and reason): Cues of next part of face to wash.  Hand over hand for initial har brushing and to hold onto brush - pt iniated approx 5 strokes on her  own.                                      Vision                     Perception     Praxis      Cognition                             Extremity/Trunk Assessment               Exercises     Shoulder Instructions       General Comments      Pertinent Vitals/ Pain       Pt denies pain  Home Living                                          Prior Functioning/Environment              Frequency Min 2X/week     Progress Toward Goals  OT Goals(current goals can now be found in the care  plan section)  Progress towards OT goals: Progressing toward goals     Plan Discharge plan remains appropriate    Co-evaluation                 End of Session     Activity Tolerance Patient tolerated treatment well   Patient Left in bed;with family/visitor present   Nurse Communication          Time: 1100-1117 OT Time Calculation (min): 17 min  Charges: OT General Charges $OT Visit: 1 Procedure OT Evaluation $Initial OT Evaluation Tier I: 1 Procedure  Bea Graff, MS, OTR/L [phone]  01/03/2014, 11:19 AM

## 2014-01-03 NOTE — Progress Notes (Signed)
Patients BP is 133/55 after re-assessing once notified by the NT. All other VS stable, patient in no acute distress. Paged the on-call mid-level physician for any orders. Will follow orders given and continue to monitor this patient.  Mid-level physician called, we will continue to monitor the patient.

## 2014-01-04 ENCOUNTER — Inpatient Hospital Stay (HOSPITAL_COMMUNITY): Payer: Medicare Other

## 2014-01-04 DIAGNOSIS — J438 Other emphysema: Secondary | ICD-10-CM | POA: Diagnosis not present

## 2014-01-04 DIAGNOSIS — S72009A Fracture of unspecified part of neck of unspecified femur, initial encounter for closed fracture: Secondary | ICD-10-CM | POA: Diagnosis not present

## 2014-01-04 DIAGNOSIS — D72829 Elevated white blood cell count, unspecified: Secondary | ICD-10-CM | POA: Diagnosis not present

## 2014-01-04 DIAGNOSIS — D696 Thrombocytopenia, unspecified: Secondary | ICD-10-CM | POA: Diagnosis not present

## 2014-01-04 DIAGNOSIS — I498 Other specified cardiac arrhythmias: Secondary | ICD-10-CM | POA: Diagnosis not present

## 2014-01-04 DIAGNOSIS — J9819 Other pulmonary collapse: Secondary | ICD-10-CM | POA: Diagnosis not present

## 2014-01-04 DIAGNOSIS — D62 Acute posthemorrhagic anemia: Secondary | ICD-10-CM | POA: Diagnosis not present

## 2014-01-04 DIAGNOSIS — N390 Urinary tract infection, site not specified: Secondary | ICD-10-CM | POA: Diagnosis not present

## 2014-01-04 DIAGNOSIS — J90 Pleural effusion, not elsewhere classified: Secondary | ICD-10-CM | POA: Diagnosis not present

## 2014-01-04 DIAGNOSIS — S72033A Displaced midcervical fracture of unspecified femur, initial encounter for closed fracture: Secondary | ICD-10-CM | POA: Diagnosis not present

## 2014-01-04 LAB — URINALYSIS, ROUTINE W REFLEX MICROSCOPIC
BILIRUBIN URINE: NEGATIVE
GLUCOSE, UA: NEGATIVE mg/dL
Ketones, ur: 40 mg/dL — AB
Nitrite: NEGATIVE
PROTEIN: 30 mg/dL — AB
Specific Gravity, Urine: >1.03 — ABNORMAL HIGH (ref 1.005–1.030)
UROBILINOGEN UA: 1 mg/dL (ref 0.0–1.0)
pH: 6 (ref 5.0–8.0)

## 2014-01-04 LAB — TROPONIN I

## 2014-01-04 LAB — CBC
HCT: 29.6 % — ABNORMAL LOW (ref 36.0–46.0)
Hemoglobin: 10 g/dL — ABNORMAL LOW (ref 12.0–15.0)
MCH: 31.5 pg (ref 26.0–34.0)
MCHC: 33.8 g/dL (ref 30.0–36.0)
MCV: 93.4 fL (ref 78.0–100.0)
PLATELETS: 124 10*3/uL — AB (ref 150–400)
RBC: 3.17 MIL/uL — ABNORMAL LOW (ref 3.87–5.11)
RDW: 13.8 % (ref 11.5–15.5)
WBC: 9.1 10*3/uL (ref 4.0–10.5)

## 2014-01-04 LAB — URINE MICROSCOPIC-ADD ON

## 2014-01-04 MED ORDER — SODIUM CHLORIDE 0.9 % IV BOLUS (SEPSIS)
250.0000 mL | Freq: Once | INTRAVENOUS | Status: AC
  Administered 2014-01-04: 250 mL via INTRAVENOUS

## 2014-01-04 MED ORDER — SODIUM CHLORIDE 0.9 % IV SOLN
INTRAVENOUS | Status: DC
  Administered 2014-01-04: 17:00:00 via INTRAVENOUS

## 2014-01-04 MED ORDER — DEXTROSE 5 % IV SOLN
1.0000 g | INTRAVENOUS | Status: DC
  Administered 2014-01-04 – 2014-01-05 (×2): 1 g via INTRAVENOUS
  Filled 2014-01-04 (×2): qty 10

## 2014-01-04 MED ORDER — SODIUM CHLORIDE 0.9 % IV SOLN
INTRAVENOUS | Status: DC
  Administered 2014-01-04 – 2014-01-05 (×2): via INTRAVENOUS

## 2014-01-04 NOTE — Progress Notes (Signed)
Occupational Therapy Treatment Patient Details Name: [name] MRN: [id number] DOB: [birth year] Today's Date: 01/04/2014    History of present illness 80 year old female who  has a past medical history of Dementia and Osteoarthritis. today was brought to the ED after patient sustained a mechanical fall at home. As per patient's niece, patient got up from the bed to go to the bathroom lost her balance and fell backwards hitting her head and right hip. Patient experienced pain in the right hip, she has 24 hour caregivers, who informed the niece. Patient was brought to the ED and found right subcapital femoral neck fracture. Pt is s/p righ bipolar hip arthroplasty.   OT comments  Pt with increased lethargy this AM.  Family attempting for pt to be more awake at beginning of session.  Treatment focused on self-feeding breakfast - pt required max assist this AM for using utensil.  Pt also held piece of bacon in hand - brought to mouth with mod assist, but would not open mouth to accept bite.  D/c plan to SNF remains appropriate.   Follow Up Recommendations  SNF;Supervision/Assistance - 24 hour    Equipment Recommendations       Recommendations for Other Services      Precautions / Restrictions Precautions Precautions: Fall Precaution Comments: direct lateral hip approach Restrictions RLE Weight Bearing: Weight bearing as tolerated       Mobility Bed Mobility                  Transfers                      Balance                                   ADL   Eating/Feeding: Maximal assistance;Bed level Eating/Feeding Details (indicate cue type and reason): Pt with increased lethargy and fatigue this AM.  hand over hand assist to grasp fork, place food onto fork, and bring fork to mouth.  3x pt opened mouth to accept food of fork she was holding.                                          Vision                     Perception      Praxis      Cognition   Behavior During Therapy: WFL for tasks assessed/performed Overall Cognitive Status: History of cognitive impairments - at baseline                       Extremity/Trunk Assessment               Exercises     Shoulder Instructions       General Comments      Pertinent Vitals/ Pain         Home Living                                          Prior Functioning/Environment              Frequency Min 2X/week     Progress Toward Goals  OT Goals(current goals can now be found in the care plan section)  Progress towards OT goals: Progressing toward goals     Plan Discharge plan remains appropriate    Co-evaluation                 End of Session     Activity Tolerance Patient tolerated treatment well   Patient Left in bed;with family/visitor present   Nurse Communication          Time: 6295-2841 OT Time Calculation (min): 26 min  Charges: OT General Charges $OT Visit: 1 Procedure OT Treatments $Self Care/Home Management : 23-37 mins  Bea Graff, MS, OTR/L [phone]  01/04/2014, 9:14 AM

## 2014-01-04 NOTE — Progress Notes (Signed)
[facility] PROGRESS NOTE  [name] NLZ:[id number] DOB: 07-17-1925 DOA: 01/01/2014 PCP: Purvis Kilts, MD   Summary:  80 year old woman with a complicated past medical history consisting of dementia and osteoarthritis who presented to the emergency department after a mechanical fall at home when she lost her balance and fell backwards hitting her head and landing on her right hip. Imaging revealed right femoral neck fracture. CT of the head and neck were unremarkable the patient was admitted for orthopedic consultation and treatment.    Assessment/Plan: Subcapital right hip fracture status post mechanical fall. S/P operative intervention per orthopedics on 5/31. Pain management good. PT recommending SNF  DT prophylaxis lovenox 30mg  subq x 28 days. Follow up with ortho 1 month. Weight bearing as tolerated. Abduction pillow in place for 6 weeks. Staples out 01/15/14.   Chest discomfort secondary to previous fall 5/14. No further episodes.  No history of cardiac disease.    Dementia. Remains stable at baseline.   Acute blood loss anemia: related to surgery. Improved S/P 1 unit PRBC's. No s/sx active bleeding.   Leukocytosis: resolved. Likely reactive related to #1. She remains afebrile and non-toxic appearing. contine incentive spirometry. Check urine. Will monitor.   HTN: Fair control. Episodes of elevation likely related to pain. Continue low dose BB with parameters. No hx HTN. Monitor closely.   Tachycardia: mild. May be multifactorial i.e. Pain/fever/?infection.  Patient with rectal temp 100.9. Somewhat lethargic. Diaphoretic.  Will bolus gently with IV fluids. Will check urine, chest xray blood cultures and troponin. Staples right hip clean and dry.  Low dose BB as above. Monitor closely   Thrombocytopenia: chart review indicates chronic. Current level close to baseline. Will monitor. May benefit OP follow up     Code Status: full Family Communication: niece at  bedside Disposition Plan: facility when ready   Consultants:  Dr Aline Brochure orthopedics  Procedures: 01/01/14 right hip partial repair   Antibiotics:  none  HPI/Subjective: Eyes close. Arouses to verbal stimuli but lethargic. Denies pain/discomfort.   Objective: Filed Vitals:   01/04/14 0822  BP: 133/76  Pulse: 100  Temp:   Resp: 99    Intake/Output Summary (Last 24 hours) at 01/04/14 1036 Last data filed at 01/04/14 0928  Gross per 24 hour  Intake    540 ml  Output      0 ml  Net    540 ml   Filed Weights   01/01/14 0330 01/01/14 0813  Weight: 52.164 kg (115 lb) 56.7 kg (125 lb)    Exam:   General:  Calm, somewhat pale, lethargic, diaphoretic  Cardiovascular: tachycardic but regular No MGR No LE edema  Respiratory: normal effort BS distant but clear no wheeze  Abdomen: soft +BS non-tender to palpation  Musculoskeletal: staples to right hip clean and dry. Other joints without swelling/etrythema   Data Reviewed: Basic Metabolic Panel:  Recent Labs Lab 01/01/14 0403 01/02/14 0520 01/03/14 0550  NA 142 140 138  K 3.9 4.0 3.9  CL 104 104 103  CO2 28 26 26   GLUCOSE 112* 113* 105*  BUN 15 12 13   CREATININE 0.74 0.61 0.61  CALCIUM 8.8 8.0* 8.5   Liver Function Tests: No results found for this basename: AST, ALT, ALKPHOS, BILITOT, PROT, ALBUMIN,  in the last 168 hours No results found for this basename: LIPASE, AMYLASE,  in the last 168 hours No results found for this basename: AMMONIA,  in the last 168 hours CBC:  Recent Labs Lab 01/01/14 0403 01/02/14  8127 01/03/14 0550 01/04/14 0509  WBC 4.4 12.5* 13.4* 9.1  NEUTROABS 2.9  --   --   --   HGB 11.2* 9.2* 11.0* 10.0*  HCT 33.9* 27.5* 32.5* 29.6*  MCV 95.5 94.5 93.1 93.4  PLT 148* 136* 131* 124*   Cardiac Enzymes: No results found for this basename: CKTOTAL, CKMB, CKMBINDEX, TROPONINI,  in the last 168 hours BNP (last 3 results) No results found for this basename: PROBNP,  in the last  8760 hours CBG: No results found for this basename: GLUCAP,  in the last 168 hours  Recent Results (from the past 240 hour(s))  SURGICAL PCR SCREEN     Status: None   Collection Time    01/01/14 11:25 AM      Result Value Ref Range Status   MRSA, PCR NEGATIVE  NEGATIVE Final   Staphylococcus aureus NEGATIVE  NEGATIVE Final   Comment:            The Xpert SA Assay (FDA     approved for NASAL specimens     in patients over 53 years of age),     is one component of     a comprehensive surveillance     program.  Test performance has     been validated by [employer] for patients greater     than or equal to 46 year old.     It is not intended     to diagnose infection nor to     guide or monitor treatment.     Studies: No results found.  Scheduled Meds: . citalopram  20 mg Oral Daily  . enoxaparin (LOVENOX) injection  40 mg Subcutaneous Q24H  . metoprolol tartrate  12.5 mg Oral BID  . polyethylene glycol  17 g Oral Daily  . silver sulfADIAZINE  1 application Topical Daily  . sodium chloride  250 mL Intravenous Once  . traMADol  50 mg Oral 4 times per day   Continuous Infusions:   Principal Problem:   Femoral neck fracture Active Problems:   Dementia   Depression   Fracture of femoral neck, right, closed   Acute blood loss anemia   Leukocytosis, unspecified   Thrombocytopenia, unspecified   Sinus tachycardia    Time spent: 35 minutes    [facility] Pager [phone]. If 7PM-7AM, please contact night-coverage at [email or web address], password [facility] 01/04/2014, 10:36 AM  LOS: 3 days

## 2014-01-04 NOTE — Progress Notes (Addendum)
Physical Therapy Treatment Patient Details Name: [name] MRN: [id number] DOB: June 17, 1925 Today's Date: 01/04/2014    History of Present Illness      PT Comments    Pt with cognition impairments limiting progress this session.  Pt tolerated well with bed exercises AAROM with ankle pumps and heel slides.  Max assistance required with bed mobility with 2+ assistance with sit to stands.  Pt unable to follow commands for safety with hand placements to assist with bed mobility and sit to stands, O2 sat dropped to 86% upon standing, decision made to not transfer to chair due to cognition impairments this session. PLB instructed and O2 sat increased to 92%.  No reports of pain through session.  Pt left in bed with guest and RN in room.    Follow Up Recommendations        Equipment Recommendations       Recommendations for Other Services       Precautions / Restrictions Precautions Precautions: Fall Precaution Comments: direct lateral hip approach Restrictions Weight Bearing Restrictions: Yes RLE Weight Bearing: Weight bearing as tolerated Other Position/Activity Restrictions: abd pillow in bed    Mobility  Bed Mobility Overal bed mobility: Needs Assistance Bed Mobility: Supine to Sit     Supine to sit: Max assist;HOB elevated     General bed mobility comments: Max A +1 using bed mat to pull BLEs around while holding trunk upright  Transfers Overall transfer level: Needs assistance Equipment used: None Transfers: Sit to/from [facility] Sit to Stand: +2 physical assistance Stand pivot transfers: Max assist;Total assist       General transfer comment: max cues for technique, cues to increase forward trunk flexion to assist with standing, pt needed tactile and verbal cues to assist with step and sequencing to complete pivot to the chair. Pt  required +2 for safety.  Ambulation/Gait                 Stairs            Wheelchair Mobility     Modified Rankin (Stroke Patients Only)       Balance                                    Cognition Arousal/Alertness: Awake/alert Behavior During Therapy: WFL for tasks assessed/performed Overall Cognitive Status: History of cognitive impairments - at baseline                      Exercises Total Joint Exercises Ankle Circles/Pumps: AROM;Both;Strengthening;5 reps;Supine Heel Slides: AAROM;Right;10 reps    General Comments        Pertinent Vitals/Pain No c/o pain    Home Living                      Prior Function            PT Goals (current goals can now be found in the care plan section) Progress towards PT goals: Progressing toward goals    Frequency       PT Plan Current plan remains appropriate    Co-evaluation             End of Session Equipment Utilized During Treatment: Gait belt Activity Tolerance: Patient tolerated treatment well;Patient limited by fatigue Patient left: in bed;with call bell/phone within reach;with bed alarm set;with nursing/sitter in room;with family/visitor present  Time: 2924-4628 PT Time Calculation (min): 24 min  Charges:  $Therapeutic Exercise: 8-22 mins $Therapeutic Activity: 8-22 mins                    G Codes:    [place] 01/04/2014, 1:52 PM

## 2014-01-04 NOTE — Progress Notes (Signed)
I have seen and assessed patient and agree with Dyanne Carrel, NP assessment and plan. Patient with some confusion, low grade temp today. Will panculture. IVF. Supportive care.

## 2014-01-04 NOTE — Clinical Social Work Note (Signed)
Pt's niece, [name] notified that bed at [facility] fell through. Agreed to faxing out referral to [facility]. CSW spoke with her about bed offer at [facility], but family is not interested. She is to get back in touch with CSW with their next options. CSW will follow up with pt's SNF preferences again tomorrow as pt will not be d/c today.   Benay Pike, [place]

## 2014-01-04 NOTE — Progress Notes (Signed)
Patient loss IV access this morning. She is not receiving anything via IV and is due to be discharged today to [facility]. Paged the on-call physician, got order to leave IV out for now.

## 2014-01-05 DIAGNOSIS — F0390 Unspecified dementia without behavioral disturbance: Secondary | ICD-10-CM | POA: Diagnosis not present

## 2014-01-05 DIAGNOSIS — S72009A Fracture of unspecified part of neck of unspecified femur, initial encounter for closed fracture: Secondary | ICD-10-CM | POA: Diagnosis not present

## 2014-01-05 DIAGNOSIS — I498 Other specified cardiac arrhythmias: Secondary | ICD-10-CM | POA: Diagnosis not present

## 2014-01-05 DIAGNOSIS — N390 Urinary tract infection, site not specified: Secondary | ICD-10-CM | POA: Diagnosis not present

## 2014-01-05 LAB — TYPE AND SCREEN
ABO/RH(D): A POS
Antibody Screen: NEGATIVE
UNIT DIVISION: 0
Unit division: 0

## 2014-01-05 LAB — CBC WITH DIFFERENTIAL/PLATELET
Basophils Absolute: 0 10*3/uL (ref 0.0–0.1)
Basophils Relative: 0 % (ref 0–1)
Eosinophils Absolute: 0 10*3/uL (ref 0.0–0.7)
Eosinophils Relative: 0 % (ref 0–5)
HEMATOCRIT: 29.5 % — AB (ref 36.0–46.0)
Hemoglobin: 9.8 g/dL — ABNORMAL LOW (ref 12.0–15.0)
LYMPHS PCT: 9 % — AB (ref 12–46)
Lymphs Abs: 0.7 10*3/uL (ref 0.7–4.0)
MCH: 31.1 pg (ref 26.0–34.0)
MCHC: 33.2 g/dL (ref 30.0–36.0)
MCV: 93.7 fL (ref 78.0–100.0)
MONO ABS: 1.1 10*3/uL — AB (ref 0.1–1.0)
Monocytes Relative: 14 % — ABNORMAL HIGH (ref 3–12)
Neutro Abs: 5.9 10*3/uL (ref 1.7–7.7)
Neutrophils Relative %: 77 % (ref 43–77)
Platelets: 137 10*3/uL — ABNORMAL LOW (ref 150–400)
RBC: 3.15 MIL/uL — ABNORMAL LOW (ref 3.87–5.11)
RDW: 13.5 % (ref 11.5–15.5)
WBC: 7.7 10*3/uL (ref 4.0–10.5)

## 2014-01-05 LAB — URINE CULTURE
COLONY COUNT: NO GROWTH
CULTURE: NO GROWTH

## 2014-01-05 LAB — BASIC METABOLIC PANEL
BUN: 16 mg/dL (ref 6–23)
CHLORIDE: 105 meq/L (ref 96–112)
CO2: 24 meq/L (ref 19–32)
CREATININE: 0.58 mg/dL (ref 0.50–1.10)
Calcium: 8.3 mg/dL — ABNORMAL LOW (ref 8.4–10.5)
GFR calc Af Amer: >90 mL/min (ref >90)
GFR calc non Af Amer: 81 mL/min — ABNORMAL LOW (ref >90)
Glucose, Bld: 94 mg/dL (ref 70–99)
Potassium: 4.1 mEq/L (ref 3.7–5.3)
Sodium: 141 mEq/L (ref 137–147)

## 2014-01-05 MED ORDER — SODIUM CHLORIDE 0.9 % IV SOLN
INTRAVENOUS | Status: AC
  Administered 2014-01-05 – 2014-01-06 (×2): via INTRAVENOUS

## 2014-01-05 NOTE — Progress Notes (Signed)
OT Cancellation Note  Patient Details Name: ARAH ARO MRN: [id number] DOB: 1924/11/20   Cancelled Treatment:    Reason Eval/Treat Not Completed: Other (comment);Pain limiting ability to participate (Pt had just received pain meds and neice was ust settling pt down and beginning to eat breakfast with her.  Due to pt's current confusion, pt demonstrates improved tolerance of eating with neice over OTR.  OTR and neice determined that neice should continue feeding this AM.  OTR will re-attempt OT treatment at later time/date.)  Bea Graff, [place], OTR/L [phone]  01/05/2014, 9:29 AM

## 2014-01-05 NOTE — Clinical Social Work Note (Signed)
CSW spoke with NP regarding pt. Possible d/c tomorrow. Received call from [facility] that they would have a bed tomorrow. Pt's niece, [name] accepts bed. Facility notified. CSW will continue to follow.  Benay Pike, [place]

## 2014-01-05 NOTE — Progress Notes (Signed)
I have seen and assessed patient and agree with Dyanne Carrel, nurse practitioner's assessment and plan.

## 2014-01-05 NOTE — Progress Notes (Signed)
PT Cancellation Note  Patient Details Name: [name] MRN: [id number] DOB: May 29, 1925   Cancelled Treatment:     Attempted to see pt today and pt just received pain medication and requested that we return later this morning for therapy.    Colon Flattery Ernestine Conrad 01/05/2014, 9:04 AM

## 2014-01-05 NOTE — Progress Notes (Signed)
Physical Therapy Treatment Patient Details Name: [name] MRN: [id number] DOB: 03-Apr-1925 Today's Date: 01/05/2014          PT Comments Patient is still confused but more verbal, still not able to follow commands well requiring AAROM (which is improvement from PROM at last session) for therex and Max A/Total A for supine<>EOB and standing pivot to recliner without AD. Patient unable to sit EOB independently due to not wanting to put weight on surgical R hip.                               Mobility  Bed Mobility Overal bed mobility: Needs Assistance Bed Mobility: Supine to Sit     Supine to sit: Total assist;HOB elevated        Transfers   Equipment used: None Transfers: Stand Pivot Transfers Sit to Stand: Max assist;From elevated surface Stand pivot transfers: Total assist;Max assist (+1)                                                                                           Exercises General Exercises - Lower Extremity Ankle Circles/Pumps: AROM;Both;5 reps;Supine Quad Sets:  (attempted;confused) Short Arc Quad:  (attempted:confused) Heel Slides: AAROM;10 reps;Supine;Both Hip ABduction/ADduction: AAROM;Both;10 reps;Supine                                         PT Goals (current goals can now be found in the care plan section) Progress towards PT goals: Progressing toward goals     End of Session   Activity Tolerance: Patient limited by lethargy (decreased cognition) Patient left: in chair;with call bell/phone within reach;with chair alarm set;with family/visitor present     Time: 2229-7989 PT Time Calculation (min): 26 min  Charges:  $Therapeutic Exercise: 8-22 mins $Therapeutic Activity: 8-22 mins                    G Codes:      Molli Knock 01/05/2014, 12:17 PM

## 2014-01-05 NOTE — Progress Notes (Signed)
PT Cancellation Note  Patient Details Name: [name] MRN: [id number] DOB: [birth year]   Cancelled Treatment:    Reason Eval/Treat Not Completed: Other (comment);Therapy attempted, Patient was being cleaned up after bedpan use and then about to eat breakfast, will see for therapy later today   Molli Knock 01/05/2014, 9:23 AM

## 2014-01-05 NOTE — Progress Notes (Signed)
[facility] PROGRESS NOTE  [name] DXA:[id number] DOB: October 02, 1924 DOA: 01/01/2014 PCP: Purvis Kilts, MD  Summary:  80 year old woman with a complicated past medical history consisting of dementia and osteoarthritis who presented to the emergency department after a mechanical fall at home when she lost her balance and fell backwards hitting her head and landing on her right hip. Imaging revealed right femoral neck fracture. CT of the head and neck were unremarkable the patient was admitted for orthopedic consultation and treatment  Assessment/Plan: Subcapital right hip fracture status post mechanical fall. S/P operative intervention per orthopedics on 5/31. Pain management good. PT recommending SNF. DVTT prophylaxis lovenox 30mg  subq x 28 days. Follow up with ortho 1 month. Weight bearing as tolerated. Abduction pillow in place for 6 weeks. Staples out 01/15/14.   Chest discomfort secondary to previous fall 5/14. No further episodes. No history of cardiac disease.  Dementia. Some worsening 01/04/14 likely related to UTI. Rocephin started. Some improvemtn today.    Acute blood loss anemia: related to surgery. S/P 1 unit PRBC's on 01/01/14. Trending down slightly today. Likely dilutional from IV fluids.  No s/sx active bleeding. Monitor  Leukocytosis: resolved. Likely reactive related to #1. Spiked a fever 01/04/14. Urine somewhat dirty. Await culture. Rocephin day #2.  contine incentive spirometry.  Will monitor.   HTN: somewhat labile yesterday. Given gentle IV fluids and BB discontinued. No hx HTN. Today SBP range 128-154. Monitor closely.   Tachycardia: Resolved after fluids yesterday. Trending up slightly today. HR range 93. Chest xray unremarkable.  Staples right hip clean and dry. Monitor closely   Thrombocytopenia: chart review indicates chronic. Current level close to baseline. Will monitor. May benefit OP follow up   UTI: await urine cultures. Rocephin day #2.    Code  Status: full Family Communication: niece at bedside Disposition Plan: snf for rehab hopefully tomorrow   Consultants:  Dr. Aline Brochure ortho  Procedures: 01/01/14 right hip partial repair  Antibiotics:  Rocephin 01/04/14>>  HPI/Subjective: Awake alert denies pain/discomfort  Objective: Filed Vitals:   01/05/14 0800  BP:   Pulse:   Temp:   Resp: 18    Intake/Output Summary (Last 24 hours) at 01/05/14 0909 Last data filed at 01/05/14 0553  Gross per 24 hour  Intake   1925 ml  Output      0 ml  Net   1925 ml   Filed Weights   01/01/14 0330 01/01/14 0813  Weight: 52.164 kg (115 lb) 56.7 kg (125 lb)    Exam:   General:  Thin somewhat frail, calm  Cardiovascular: RRR No MGR No LE edema  Respiratory: normal effort BS clear to auscultation bilaterally no wheeze  Abdomen: flat soft +BS non-tender to palpation  Musculoskeletal: staples to right hip clean and dry. No erythema, swelling or drainage.    Data Reviewed: Basic Metabolic Panel:  Recent Labs Lab 01/01/14 0403 01/02/14 0520 01/03/14 0550 01/05/14 0521  NA 142 140 138 141  K 3.9 4.0 3.9 4.1  CL 104 104 103 105  CO2 28 26 26 24   GLUCOSE 112* 113* 105* 94  BUN 15 12 13 16   CREATININE 0.74 0.61 0.61 0.58  CALCIUM 8.8 8.0* 8.5 8.3*   Liver Function Tests: No results found for this basename: AST, ALT, ALKPHOS, BILITOT, PROT, ALBUMIN,  in the last 168 hours No results found for this basename: LIPASE, AMYLASE,  in the last 168 hours No results found for this basename: AMMONIA,  in the last 168 hours  CBC:  Recent Labs Lab 01/01/14 0403 01/02/14 0520 01/03/14 0550 01/04/14 0509 01/05/14 0521  WBC 4.4 12.5* 13.4* 9.1 7.7  NEUTROABS 2.9  --   --   --  5.9  HGB 11.2* 9.2* 11.0* 10.0* 9.8*  HCT 33.9* 27.5* 32.5* 29.6* 29.5*  MCV 95.5 94.5 93.1 93.4 93.7  PLT 148* 136* 131* 124* 137*   Cardiac Enzymes:  Recent Labs Lab 01/04/14 1128  TROPONINI <0.30   BNP (last 3 results) No results found  for this basename: PROBNP,  in the last 8760 hours CBG: No results found for this basename: GLUCAP,  in the last 168 hours  Recent Results (from the past 240 hour(s))  SURGICAL PCR SCREEN     Status: None   Collection Time    01/01/14 11:25 AM      Result Value Ref Range Status   MRSA, PCR NEGATIVE  NEGATIVE Final   Staphylococcus aureus NEGATIVE  NEGATIVE Final   Comment:            The Xpert SA Assay (FDA     approved for NASAL specimens     in patients over 57 years of age),     is one component of     a comprehensive surveillance     program.  Test performance has     been validated by [employer] for patients greater     than or equal to 22 year old.     It is not intended     to diagnose infection nor to     guide or monitor treatment.     Studies: Dg Chest Port 1 View  01/04/2014   CLINICAL DATA:  Fever, postoperative day 3  EXAM: PORTABLE CHEST - 1 VIEW  COMPARISON:  Jan 01 2014  FINDINGS: The heart size and mediastinal contours are stable. The heart size is enlarged. The lungs are hyperinflated. There is a small left pleural effusion. There is mild atelectasis of left lung base. Minimal right pleural effusion is identified. There is no pulmonary edema. The visualized skeletal structures are stable.  IMPRESSION: Small left pleural effusion and minimal right pleural effusion. Mild atelectasis of left lung base. Emphysema.   Electronically Signed   By: Abelardo Diesel M.D.   On: 01/04/2014 11:52    Scheduled Meds: . cefTRIAXone (ROCEPHIN)  IV  1 g Intravenous Q24H  . citalopram  20 mg Oral Daily  . enoxaparin (LOVENOX) injection  40 mg Subcutaneous Q24H  . polyethylene glycol  17 g Oral Daily  . silver sulfADIAZINE  1 application Topical Daily  . traMADol  50 mg Oral 4 times per day   Continuous Infusions: . sodium chloride 10 mL/hr at 01/05/14 5993    Principal Problem:   Femoral neck fracture Active Problems:   Dementia   Depression   Fracture of femoral neck,  right, closed   Acute blood loss anemia   Leukocytosis, unspecified   Thrombocytopenia, unspecified   Sinus tachycardia   UTI (urinary tract infection)    Time spent: 35 minutes    [facility] Pager [phone]. If 7PM-7AM, please contact night-coverage at [email or web address], password [facility] 01/05/2014, 9:09 AM  LOS: 4 days

## 2014-01-06 ENCOUNTER — Inpatient Hospital Stay
Admission: RE | Admit: 2014-01-06 | Discharge: 2014-03-16 | Disposition: A | Discharge status: Other Acute Inpatient Hospital | Payer: 59 | Source: Ambulatory Visit | Attending: Internal Medicine | Admitting: Internal Medicine

## 2014-01-06 DIAGNOSIS — R269 Unspecified abnormalities of gait and mobility: Secondary | ICD-10-CM | POA: Diagnosis not present

## 2014-01-06 DIAGNOSIS — M6281 Muscle weakness (generalized): Secondary | ICD-10-CM | POA: Diagnosis not present

## 2014-01-06 DIAGNOSIS — Z9660 Presence of unspecified orthopedic joint implant: Secondary | ICD-10-CM | POA: Diagnosis not present

## 2014-01-06 DIAGNOSIS — F329 Major depressive disorder, single episode, unspecified: Secondary | ICD-10-CM | POA: Diagnosis not present

## 2014-01-06 DIAGNOSIS — M810 Age-related osteoporosis without current pathological fracture: Secondary | ICD-10-CM | POA: Diagnosis not present

## 2014-01-06 DIAGNOSIS — R489 Unspecified symbolic dysfunctions: Secondary | ICD-10-CM | POA: Diagnosis not present

## 2014-01-06 DIAGNOSIS — F0390 Unspecified dementia without behavioral disturbance: Secondary | ICD-10-CM | POA: Diagnosis not present

## 2014-01-06 DIAGNOSIS — R279 Unspecified lack of coordination: Secondary | ICD-10-CM | POA: Diagnosis not present

## 2014-01-06 DIAGNOSIS — D72829 Elevated white blood cell count, unspecified: Secondary | ICD-10-CM

## 2014-01-06 DIAGNOSIS — S79929A Unspecified injury of unspecified thigh, initial encounter: Secondary | ICD-10-CM | POA: Diagnosis not present

## 2014-01-06 DIAGNOSIS — S72009D Fracture of unspecified part of neck of unspecified femur, subsequent encounter for closed fracture with routine healing: Secondary | ICD-10-CM | POA: Diagnosis not present

## 2014-01-06 DIAGNOSIS — R1310 Dysphagia, unspecified: Principal | ICD-10-CM

## 2014-01-06 DIAGNOSIS — F3289 Other specified depressive episodes: Secondary | ICD-10-CM | POA: Diagnosis not present

## 2014-01-06 DIAGNOSIS — Z471 Aftercare following joint replacement surgery: Secondary | ICD-10-CM | POA: Diagnosis not present

## 2014-01-06 DIAGNOSIS — S72009A Fracture of unspecified part of neck of unspecified femur, initial encounter for closed fracture: Secondary | ICD-10-CM | POA: Diagnosis not present

## 2014-01-06 DIAGNOSIS — M1990 Unspecified osteoarthritis, unspecified site: Secondary | ICD-10-CM | POA: Diagnosis not present

## 2014-01-06 DIAGNOSIS — D696 Thrombocytopenia, unspecified: Secondary | ICD-10-CM | POA: Diagnosis not present

## 2014-01-06 DIAGNOSIS — R1311 Dysphagia, oral phase: Secondary | ICD-10-CM | POA: Diagnosis not present

## 2014-01-06 DIAGNOSIS — M129 Arthropathy, unspecified: Secondary | ICD-10-CM | POA: Diagnosis not present

## 2014-01-06 DIAGNOSIS — T84049A Periprosthetic fracture around unspecified internal prosthetic joint, initial encounter: Secondary | ICD-10-CM | POA: Diagnosis not present

## 2014-01-06 DIAGNOSIS — M25559 Pain in unspecified hip: Secondary | ICD-10-CM | POA: Diagnosis not present

## 2014-01-06 DIAGNOSIS — IMO0001 Reserved for inherently not codable concepts without codable children: Secondary | ICD-10-CM | POA: Diagnosis not present

## 2014-01-06 DIAGNOSIS — D649 Anemia, unspecified: Secondary | ICD-10-CM | POA: Diagnosis not present

## 2014-01-06 DIAGNOSIS — N390 Urinary tract infection, site not specified: Secondary | ICD-10-CM | POA: Diagnosis not present

## 2014-01-06 DIAGNOSIS — J42 Unspecified chronic bronchitis: Secondary | ICD-10-CM | POA: Diagnosis not present

## 2014-01-06 DIAGNOSIS — W19XXXA Unspecified fall, initial encounter: Secondary | ICD-10-CM | POA: Diagnosis not present

## 2014-01-06 DIAGNOSIS — M25551 Pain in right hip: Secondary | ICD-10-CM

## 2014-01-06 DIAGNOSIS — R41841 Cognitive communication deficit: Secondary | ICD-10-CM | POA: Diagnosis not present

## 2014-01-06 DIAGNOSIS — S79919A Unspecified injury of unspecified hip, initial encounter: Secondary | ICD-10-CM | POA: Diagnosis not present

## 2014-01-06 DIAGNOSIS — R262 Difficulty in walking, not elsewhere classified: Secondary | ICD-10-CM | POA: Diagnosis not present

## 2014-01-06 DIAGNOSIS — Z96649 Presence of unspecified artificial hip joint: Secondary | ICD-10-CM | POA: Diagnosis not present

## 2014-01-06 DIAGNOSIS — Z9181 History of falling: Secondary | ICD-10-CM | POA: Diagnosis not present

## 2014-01-06 LAB — CBC
HCT: 29.9 % — ABNORMAL LOW (ref 36.0–46.0)
Hemoglobin: 10.2 g/dL — ABNORMAL LOW (ref 12.0–15.0)
MCH: 31.5 pg (ref 26.0–34.0)
MCHC: 34.1 g/dL (ref 30.0–36.0)
MCV: 92.3 fL (ref 78.0–100.0)
Platelets: 146 10*3/uL — ABNORMAL LOW (ref 150–400)
RBC: 3.24 MIL/uL — ABNORMAL LOW (ref 3.87–5.11)
RDW: 12.9 % (ref 11.5–15.5)
WBC: 7 10*3/uL (ref 4.0–10.5)

## 2014-01-06 MED ORDER — HYDROCODONE-ACETAMINOPHEN 5-325 MG PO TABS: 1.0000 | ORAL_TABLET | Freq: Four times a day (QID) | ORAL | Status: DC | PRN

## 2014-01-06 MED ORDER — CEFUROXIME AXETIL 250 MG PO TABS
250.0000 mg | ORAL_TABLET | Freq: Two times a day (BID) | ORAL | Status: DC
Start: 2014-01-06 — End: 2014-03-03

## 2014-01-06 MED ORDER — CEFUROXIME AXETIL 250 MG PO TABS
250.0000 mg | ORAL_TABLET | Freq: Two times a day (BID) | ORAL | Status: DC
  Administered 2014-01-06: 250 mg via ORAL
  Filled 2014-01-06: qty 1

## 2014-01-06 MED ORDER — ALUM & MAG HYDROXIDE-SIMETH 200-200-20 MG/5ML PO SUSP: 30.0000 mL | ORAL | Status: DC | PRN

## 2014-01-06 MED ORDER — TRAMADOL HCL 50 MG PO TABS: 50.0000 mg | ORAL_TABLET | Freq: Four times a day (QID) | ORAL | Status: DC

## 2014-01-06 MED ORDER — BISACODYL 10 MG RE SUPP: 10.0000 mg | Freq: Every day | RECTAL | Status: DC | PRN

## 2014-01-06 MED ORDER — ENOXAPARIN SODIUM 40 MG/0.4ML ~~LOC~~ SOLN
30.0000 mg | SUBCUTANEOUS | Status: DC
Start: 2014-01-06 — End: 2014-03-03

## 2014-01-06 NOTE — Discharge Summary (Signed)
Physician Discharge Summary  NATANYA HOLECEK VZD:[id number] DOB: 1925/07/14 DOA: 01/01/2014  PCP: Purvis Kilts, MD  Admit date: 01/01/2014 Discharge date: 01/06/2014  Time spent: 40 minutes  Recommendations for Outpatient Follow-up:  1. Follow up with Dr Aline Brochure 3 weeks for evaluation of hip 2. Discharge to [facility]. Weight bearing as tolerated, abduction pillow 6 weeks, remove staples 01/15/14, last dose lovenox 01/29/14  Discharge Diagnoses:  Principal Problem:   Femoral neck fracture Active Problems:   Dementia   Depression   Fracture of femoral neck, right, closed   Acute blood loss anemia   Leukocytosis, unspecified   Thrombocytopenia, unspecified   Sinus tachycardia   UTI (urinary tract infection)   Discharge Condition: stable  Diet recommendation: soft   Filed Weights   01/01/14 0330 01/01/14 0813  Weight: 52.164 kg (115 lb) 56.7 kg (125 lb)    History of present illness:  80 year old female who has a past medical history of Dementia and Osteoarthritis. brought to the ED 01/01/14 after patient sustained a mechanical fall at home. As per patient's niece, patient got up from the bed to go to the bathroom lost her balance and fell backwards hitting her head and right hip. Patient experienced pain in the right hip, she has 24 hour caregivers, who informed the niece. Patient was brought to the ED and found right subcapital femoral neck fracture.  At time of admission she rated the pain in the right hip as mild after she got the pain medications. She denied passing out, no nausea vomiting or diarrhea. Patient had been having chest wall pain since she fell 2 weeks prior. There was no history of CAD or stroke. Patient before the previous fall was able to walk 2 blocks without difficulty.  Patient also had sustained small laceration in the posterior scalp.  Hospital Course:  Subcapital right hip fracture status post mechanical fall. S/P operative intervention per orthopedics  on 5/31. Pain management good. Pt being discharged to SNF.  DT prophylaxis lovenox 30mg  subq x 28 days last dose 01/29/14. Follow up with ortho 3 weeks. Weight bearing as tolerated. Abduction pillow in place for 6 weeks. Staples out 01/15/14.   Chest discomfort secondary to previous fall 5/14. No further episodes. No history of cardiac disease.   Dementia. Remains stable.   Acute blood loss anemia: related to surgery. Improved S/P 1 unit PRBC's. No s/sx active bleeding.   Leukocytosis: resolved. Likely reactive related to #1 and UTI. Rocephin for 2 days. Discharge with ceftin for 3 more days.   HTN: Fair control. Episodes of elevation likely related to pain. No hx HTN.   Tachycardia: resolved after IV fluids and antibiotics. Troponin negative. Staples right hip clean and dry.    Thrombocytopenia: chart review indicates chronic. Current level close to baseline. Will monitor. May benefit OP follow up    Procedures: 01/01/14 right hip partial repair    Consultations: Dr Aline Brochure Discharge Exam: Filed Vitals:   01/06/14 0637  BP: 150/86  Pulse: 94  Temp: 98.8 F (37.1 C)  Resp: 16    General: thin somewhat frail appearing NAD Cardiovascular: RRR No MGR No LE edema Respiratory: normal effort BS clear bilaterally Skin: staples to right hip clean and dry  Discharge Instructions You were cared for by a hospitalist during your hospital stay. If you have any questions about your discharge medications or the care you received while you were in the hospital after you are discharged, you can call the unit and asked to  speak with the hospitalist on call if the hospitalist that took care of you is not available. Once you are discharged, your primary care physician will handle any further medical issues. Please note that NO REFILLS for any discharge medications will be authorized once you are discharged, as it is imperative that you return to your primary care physician (or establish a  relationship with a primary care physician if you do not have one) for your aftercare needs so that they can reassess your need for medications and monitor your lab values.  Discharge Instructions   Diet - low sodium heart healthy    Complete by:  As directed      Discharge instructions    Complete by:  As directed   Follow up with Dr Aline Brochure  Follow up Dr Aline Brochure 3 weeks Abduction pillow for 6 weeks Weight bearing as tolerated Remove staples 01/15/14     Increase activity slowly    Complete by:  As directed             Medication List         alum & mag hydroxide-simeth 200-200-20 MG/5ML suspension  Commonly known as:  MAALOX/MYLANTA  Take 30 mLs by mouth every 4 (four) hours as needed for indigestion.     bisacodyl 10 MG suppository  Commonly known as:  DULCOLAX  Place 1 suppository (10 mg total) rectally daily as needed for moderate constipation.     cefUROXime 250 MG tablet  Commonly known as:  CEFTIN  Take 1 tablet (250 mg total) by mouth 2 (two) times daily with a meal.     citalopram 20 MG tablet  Commonly known as:  CELEXA  Take 20 mg by mouth daily.     enoxaparin 40 MG/0.4ML injection  Commonly known as:  LOVENOX  Inject 0.3 mLs (30 mg total) into the skin daily. Daily until 12/29/13. Last dose 01/29/14.     HYDROcodone-acetaminophen 5-325 MG per tablet  Commonly known as:  NORCO/VICODIN  Take 1 tablet by mouth every 6 (six) hours as needed for moderate pain.     hydroxypropyl methylcellulose 2.5 % ophthalmic solution  Commonly known as:  ISOPTO TEARS  Place 1 drop into both eyes as needed for dry eyes.     ibuprofen 200 MG tablet  Commonly known as:  ADVIL,MOTRIN  Take 200 mg by mouth every 6 (six) hours as needed for mild pain.     IRON PO  Take 1 tablet by mouth daily.     ICAPS AREDS FORMULA PO  Take 1 tablet by mouth daily.     multivitamins ther. w/minerals Tabs tablet  Take 1 tablet by mouth daily.     polyethylene glycol packet  Commonly  known as:  MIRALAX / GLYCOLAX  Take 17 g by mouth daily. Patient only puts a small amount in her coffee every morning.     raloxifene 60 MG tablet  Commonly known as:  EVISTA  Take 60 mg by mouth daily.     SSD 1 % cream  Generic drug:  silver sulfADIAZINE  Apply 1 application topically daily. Apply to sore on right arm     traMADol 50 MG tablet  Commonly known as:  ULTRAM  Take 1 tablet (50 mg total) by mouth every 6 (six) hours.       No Known Allergies    The results of significant diagnostics from this hospitalization (including imaging, microbiology, ancillary and laboratory) are listed below for reference.  Significant Diagnostic Studies: Dg Chest 1 View  01/01/2014   CLINICAL DATA:  Chest pain  EXAM: CHEST - 1 VIEW  COMPARISON:  Prior radiograph from 12/15/2013  FINDINGS: Cardiomegaly is stable as compared to prior study. Atherosclerotic calcifications present within the aortic arch.  The lungs are normally inflated. Irregular biapical pleural thickening is unchanged. Chronic coarsening of the interstitial markings also stable. No airspace consolidation or pulmonary edema. No definite pleural effusion, although the left costophrenic angle is incompletely visualized. There is no pneumothorax.  No acute osseous abnormality identified. Remote right-sided rib fractures again noted.  IMPRESSION: Stable appearance of the chest with no acute cardiopulmonary abnormality identified.   Electronically Signed   By: Jeannine Boga M.D.   On: 01/01/2014 05:27   Dg Chest 2 View  12/15/2013   CLINICAL DATA:  Recent traumatic injury with pain  EXAM: CHEST  2 VIEW  COMPARISON:  07/28/2012  FINDINGS: Cardiac shadow is stable. The lungs are clear bilaterally. Old rib fractures are noted on the right with healing. No acute infiltrate or effusion is seen.  IMPRESSION: No active cardiopulmonary disease.   Electronically Signed   By: Inez Catalina M.D.   On: 12/15/2013 19:10   Dg Pelvis 1-2  Views  12/15/2013   CLINICAL DATA:  Recent traumatic injury with pain  EXAM: PELVIS - 1-2 VIEW  COMPARISON:  None.  FINDINGS: The pelvic ring is intact. No acute fracture is seen. A calcified uterine fibroid is noted  IMPRESSION: No acute abnormality noted.   Electronically Signed   By: Inez Catalina M.D.   On: 12/15/2013 19:11   Dg Hip Complete Right  01/01/2014   CLINICAL DATA:  Fall with right hip pain  EXAM: RIGHT HIP - COMPLETE 2+ VIEW  COMPARISON:  None.  FINDINGS: Acute subcapital right femoral neck fracture with varus angulation. The femoral head remains located. No evidence of pelvic ring or left hip fracture. 3.3 cm calcified mass in the right pelvis, usually from fibroid degeneration.  IMPRESSION: Subcapital right femoral neck fracture.   Electronically Signed   By: Jorje Guild M.D.   On: 01/01/2014 05:13   Ct Head Wo Contrast  01/01/2014   CLINICAL DATA:  Fall with head injury  EXAM: CT HEAD WITHOUT CONTRAST  CT CERVICAL SPINE WITHOUT CONTRAST  TECHNIQUE: Multidetector CT imaging of the head and cervical spine was performed following the standard protocol without intravenous contrast. Multiplanar CT image reconstructions of the cervical spine were also generated.  COMPARISON:  12/15/2013  FINDINGS: CT HEAD FINDINGS  Skull and Sinuses:No significant abnormality.  Orbits: No acute abnormality.  Brain: No evidence of acute abnormality, such as acute infarction, hemorrhage, hydrocephalus, or mass lesion/mass effect. Mild chronic small vessel disease for age, with ischemic gliosis confluent around the lateral ventricles. Generalized cerebral volume loss, age congruent. Calcified intracranial atherosclerosis.  CT CERVICAL SPINE FINDINGS  No evidence of acute fracture or traumatic malalignment. Multilevel degenerative disc disease, with maximal disc narrowing and endplate degeneration at C3-4. Facet osteoarthritis with spurring and sclerosis most notable in the upper cervical region, resulting in C3-4  facet ankylosis on the right. No prevertebral edema or gross cervical canal hematoma. No high-grade spinal canal stenosis. Biapical pleural parenchymal scarring.  IMPRESSION: No acute intracranial or cervical spine injury.   Electronically Signed   By: Jorje Guild M.D.   On: 01/01/2014 05:24   Ct Head Wo Contrast  12/15/2013   CLINICAL DATA:  Fall.  EXAM: CT HEAD WITHOUT CONTRAST  CT  CERVICAL SPINE WITHOUT CONTRAST  TECHNIQUE: Multidetector CT imaging of the head and cervical spine was performed following the standard protocol without intravenous contrast. Multiplanar CT image reconstructions of the cervical spine were also generated.  COMPARISON:  Head CT 07/28/2012  FINDINGS: CT HEAD FINDINGS  Ventricles, cisterns and other CSF spaces are within normal. There is mild chronic ischemic microvascular disease present. There is no mass, mass effect, shift of midline structures or acute hemorrhage. There is no evidence of acute infarction. Remaining bones and soft tissues are within normal.  CT CERVICAL SPINE FINDINGS  Vertebral body alignment and heights are normal. There is mild spondylosis throughout the cervical spine. There is disc space narrowing at the C3-4, C4-5, C5-6 and C6-7 levels. Prevertebral soft tissues are within normal. There is mild uncovertebral joint spurring and facet arthropathy. The atlantoaxial articulation is within normal. There is no acute fracture or subluxation. There is fusion of the right C3 and C4 facet joints. Remainder of the exam is unremarkable.  IMPRESSION: No acute intracranial findings.  No acute cervical spine injury.  Minimal chronic ischemic microvascular disease.  Mild spondylosis of the cervical spine with multilevel degenerative disc disease. Fusion of the right C3/C4 facet joints.   Electronically Signed   By: Marin Olp M.D.   On: 12/15/2013 19:54   Ct Cervical Spine Wo Contrast  01/01/2014   CLINICAL DATA:  Fall with head injury  EXAM: CT HEAD WITHOUT CONTRAST   CT CERVICAL SPINE WITHOUT CONTRAST  TECHNIQUE: Multidetector CT imaging of the head and cervical spine was performed following the standard protocol without intravenous contrast. Multiplanar CT image reconstructions of the cervical spine were also generated.  COMPARISON:  12/15/2013  FINDINGS: CT HEAD FINDINGS  Skull and Sinuses:No significant abnormality.  Orbits: No acute abnormality.  Brain: No evidence of acute abnormality, such as acute infarction, hemorrhage, hydrocephalus, or mass lesion/mass effect. Mild chronic small vessel disease for age, with ischemic gliosis confluent around the lateral ventricles. Generalized cerebral volume loss, age congruent. Calcified intracranial atherosclerosis.  CT CERVICAL SPINE FINDINGS  No evidence of acute fracture or traumatic malalignment. Multilevel degenerative disc disease, with maximal disc narrowing and endplate degeneration at C3-4. Facet osteoarthritis with spurring and sclerosis most notable in the upper cervical region, resulting in C3-4 facet ankylosis on the right. No prevertebral edema or gross cervical canal hematoma. No high-grade spinal canal stenosis. Biapical pleural parenchymal scarring.  IMPRESSION: No acute intracranial or cervical spine injury.   Electronically Signed   By: Jorje Guild M.D.   On: 01/01/2014 05:24   Ct Cervical Spine Wo Contrast  12/15/2013   CLINICAL DATA:  Fall.  EXAM: CT HEAD WITHOUT CONTRAST  CT CERVICAL SPINE WITHOUT CONTRAST  TECHNIQUE: Multidetector CT imaging of the head and cervical spine was performed following the standard protocol without intravenous contrast. Multiplanar CT image reconstructions of the cervical spine were also generated.  COMPARISON:  Head CT 07/28/2012  FINDINGS: CT HEAD FINDINGS  Ventricles, cisterns and other CSF spaces are within normal. There is mild chronic ischemic microvascular disease present. There is no mass, mass effect, shift of midline structures or acute hemorrhage. There is no  evidence of acute infarction. Remaining bones and soft tissues are within normal.  CT CERVICAL SPINE FINDINGS  Vertebral body alignment and heights are normal. There is mild spondylosis throughout the cervical spine. There is disc space narrowing at the C3-4, C4-5, C5-6 and C6-7 levels. Prevertebral soft tissues are within normal. There is mild uncovertebral joint spurring and  facet arthropathy. The atlantoaxial articulation is within normal. There is no acute fracture or subluxation. There is fusion of the right C3 and C4 facet joints. Remainder of the exam is unremarkable.  IMPRESSION: No acute intracranial findings.  No acute cervical spine injury.  Minimal chronic ischemic microvascular disease.  Mild spondylosis of the cervical spine with multilevel degenerative disc disease. Fusion of the right C3/C4 facet joints.   Electronically Signed   By: Marin Olp M.D.   On: 12/15/2013 19:54   Dg Pelvis Portable  01/01/2014   CLINICAL DATA:  Postoperative radiographs. Right total hip arthroplasty.  EXAM: PORTABLE PELVIS 1-2 VIEWS  COMPARISON:  None.  FINDINGS: There is a new uncomplicated right hip hemiarthroplasty with expected changes in the soft tissues. Calcified fibroid noted. Mild left hip osteoarthritis incidentally noted.  IMPRESSION: Uncomplicated new right hip hemiarthroplasty.   Electronically Signed   By: Dereck Ligas M.D.   On: 01/01/2014 17:35   Dg Chest Port 1 View  01/04/2014   CLINICAL DATA:  Fever, postoperative day 3  EXAM: PORTABLE CHEST - 1 VIEW  COMPARISON:  Jan 01 2014  FINDINGS: The heart size and mediastinal contours are stable. The heart size is enlarged. The lungs are hyperinflated. There is a small left pleural effusion. There is mild atelectasis of left lung base. Minimal right pleural effusion is identified. There is no pulmonary edema. The visualized skeletal structures are stable.  IMPRESSION: Small left pleural effusion and minimal right pleural effusion. Mild atelectasis of  left lung base. Emphysema.   Electronically Signed   By: Abelardo Diesel M.D.   On: 01/04/2014 11:52   Dg Finger Little Left  12/15/2013   CLINICAL DATA:  Traumatic injury and pain  EXAM: LEFT LITTLE FINGER 2+V  COMPARISON:  None.  FINDINGS: Soft tissue swelling is noted. Mild degenerative changes in the interphalangeal joints are seen. No acute fracture or dislocation is noted.  IMPRESSION: Soft tissue changes without acute bony abnormality.   Electronically Signed   By: Inez Catalina M.D.   On: 12/15/2013 19:09    Microbiology: Recent Results (from the past 240 hour(s))  SURGICAL PCR SCREEN     Status: None   Collection Time    01/01/14 11:25 AM      Result Value Ref Range Status   MRSA, PCR NEGATIVE  NEGATIVE Final   Staphylococcus aureus NEGATIVE  NEGATIVE Final   Comment:            The Xpert SA Assay (FDA     approved for NASAL specimens     in patients over 41 years of age),     is one component of     a comprehensive surveillance     program.  Test performance has     been validated by [employer] for patients greater     than or equal to 57 year old.     It is not intended     to diagnose infection nor to     guide or monitor treatment.  URINE CULTURE     Status: None   Collection Time    01/04/14 11:14 AM      Result Value Ref Range Status   Specimen Description URINE, CATHETERIZED   Final   Special Requests NONE   Final   Culture  Setup Time     Final   Value: 01/04/2014 17:16     Performed at [facility]  Final   Value: NO GROWTH     Performed at [facility]   Culture     Final   Value: NO GROWTH     Performed at [facility]   Report Status 01/05/2014 FINAL   Final  CULTURE, BLOOD (ROUTINE X 2)     Status: None   Collection Time    01/04/14 11:26 AM      Result Value Ref Range Status   Specimen Description BLOOD RIGHT ANTECUBITAL   Final   Special Requests BOTTLES DRAWN AEROBIC AND ANAEROBIC 12CC   Final    Culture NO GROWTH 1 DAY   Final   Report Status PENDING   Incomplete  CULTURE, BLOOD (ROUTINE X 2)     Status: None   Collection Time    01/04/14 11:26 AM      Result Value Ref Range Status   Specimen Description BLOOD RIGHT HAND   Final   Special Requests BOTTLES DRAWN AEROBIC AND ANAEROBIC 6CC   Final   Culture NO GROWTH 1 DAY   Final   Report Status PENDING   Incomplete     Labs: Basic Metabolic Panel:  Recent Labs Lab 01/01/14 0403 01/02/14 0520 01/03/14 0550 01/05/14 0521  NA 142 140 138 141  K 3.9 4.0 3.9 4.1  CL 104 104 103 105  CO2 28 26 26 24   GLUCOSE 112* 113* 105* 94  BUN 15 12 13 16   CREATININE 0.74 0.61 0.61 0.58  CALCIUM 8.8 8.0* 8.5 8.3*   Liver Function Tests: No results found for this basename: AST, ALT, ALKPHOS, BILITOT, PROT, ALBUMIN,  in the last 168 hours No results found for this basename: LIPASE, AMYLASE,  in the last 168 hours No results found for this basename: AMMONIA,  in the last 168 hours CBC:  Recent Labs Lab 01/01/14 0403 01/02/14 0520 01/03/14 0550 01/04/14 0509 01/05/14 0521 01/06/14 0518  WBC 4.4 12.5* 13.4* 9.1 7.7 7.0  NEUTROABS 2.9  --   --   --  5.9  --   HGB 11.2* 9.2* 11.0* 10.0* 9.8* 10.2*  HCT 33.9* 27.5* 32.5* 29.6* 29.5* 29.9*  MCV 95.5 94.5 93.1 93.4 93.7 92.3  PLT 148* 136* 131* 124* 137* 146*   Cardiac Enzymes:  Recent Labs Lab 01/04/14 1128  TROPONINI <0.30   BNP: BNP (last 3 results) No results found for this basename: PROBNP,  in the last 8760 hours CBG: No results found for this basename: GLUCAP,  in the last 168 hours     Signed:  Lezlie Octave Sheng Pritz  [facility] 01/06/2014, 11:06 AM

## 2014-01-06 NOTE — Discharge Summary (Signed)
I have seen and assessed patient and agree with Veverly Fells assessment and plan.

## 2014-01-06 NOTE — Clinical Social Work Note (Signed)
[facility] notified that patient anticipated to dc today - facility will call when room is ready today.  Please wait til CSW notifies that SNF is ready to receive patient.  Edwyna Shell, LCSW Clinical Social Worker [phone])

## 2014-01-06 NOTE — Clinical Social Work Note (Signed)
Patient ready for discharge today, will transfer to [facility] SNF w [facility] staff via tunnel.  FL2 reviewed w RN and updated as needed. Discharge summary faxed to facility.  Heather, healthcare POA, informed by VM.  Niece Helene Kelp in room informed that patient will transfer today to facility, family agreeable.  Patient oriented to self only.  Discharge packet prepared and placed w shadow chart for transfer.  [facility] admissions asked that we wait until room is ready to transfer patient, will call when patient can transfer.  Edwyna Shell, LCSW Clinical Social Worker [phone])

## 2014-01-06 NOTE — Progress Notes (Signed)
PT Cancellation Note  Patient Details Name: [name] MRN: [id number] DOB: 1924/11/30   Cancelled Treatment:    Reason Eval/Treat Not Completed: Other (comment) Per family, patient being d/c to [facility] today.  No questions for PT by patient/family.    Lonna Cobb 01/06/2014, 2:52 PM

## 2014-01-06 NOTE — Progress Notes (Signed)
Report called to Santiago Glad at the [facility].  Patient is stable at this time.  Family members aware of discharge plan.

## 2014-01-09 ENCOUNTER — Non-Acute Institutional Stay (SKILLED_NURSING_FACILITY): Payer: Medicare Other | Admitting: Internal Medicine

## 2014-01-09 ENCOUNTER — Other Ambulatory Visit: Payer: Self-pay | Admitting: *Deleted

## 2014-01-09 DIAGNOSIS — M810 Age-related osteoporosis without current pathological fracture: Secondary | ICD-10-CM | POA: Insufficient documentation

## 2014-01-09 DIAGNOSIS — R269 Unspecified abnormalities of gait and mobility: Secondary | ICD-10-CM

## 2014-01-09 LAB — CULTURE, BLOOD (ROUTINE X 2)
CULTURE: NO GROWTH
CULTURE: NO GROWTH

## 2014-01-09 MED ORDER — HYDROCODONE-ACETAMINOPHEN 5-325 MG PO TABS: 1.0000 | ORAL_TABLET | Freq: Four times a day (QID) | ORAL | Status: DC | PRN

## 2014-01-09 MED ORDER — TRAMADOL HCL 50 MG PO TABS: 50.0000 mg | ORAL_TABLET | Freq: Four times a day (QID) | ORAL | Status: DC

## 2014-01-09 NOTE — Telephone Encounter (Signed)
[facility] 

## 2014-01-09 NOTE — Progress Notes (Signed)
Patient ID: [name], female   DOB: [birth year], 80 y.o.   MRN: [id number]  Facility; [facility] Chief complaint; admission to SNF post admit to [facility] from 5/31 to 01/06/2014  History; this is an 80 year old woman who lives in her own home in [place]. She apparently has around-the-clock sitters. Given this she is apparently independent enough to get dressed toilet her and bathe herself. According to the woman was here this morning she can even cook her own breakfast. She had been suffering falls and had been in the emergency department 2-3 weeks prior to this fall with a fall sustaining skin tears up. On this occasion she got up from bed to go to the bathroom lost her balance and fell backwards hitting her head and her right hip. She was found to have a right subcapital femoral neck fracture. There was no history of loss of consciousness. The patient underwent a bipolar arthroplasty of the right hip. There does not appear to have been any postoperative complications. She is on Lovenox 30 mg subcutaneous for 28 days with the last dose on 6/28.  Past Medical History  Diagnosis Date  . Dementia   . Osteoarthritis    Past Surgical History  Procedure Laterality Date  . Breast surgery    . Eye surgery    . Hip arthroplasty Right 01/01/2014    Procedure: BIPOLAR ARTHROPLASTY RIGHT HIP;  Surgeon: Carole Civil, MD;  Location: [facility];  Service: Orthopedics;  Laterality: Right;   Current Outpatient Prescriptions on File Prior to Visit  Medication Sig Dispense Refill  . alum & mag hydroxide-simeth (MAALOX/MYLANTA) 200-200-20 MG/5ML suspension Take 30 mLs by mouth every 4 (four) hours as needed for indigestion.  355 mL  0  . bisacodyl (DULCOLAX) 10 MG suppository Place 1 suppository (10 mg total) rectally daily as needed for moderate constipation.  12 suppository  0  . cefUROXime (CEFTIN) 250 MG tablet Take 1 tablet (250 mg total) by mouth 2 (two) times daily with a meal.  6 tablet  0  .  citalopram (CELEXA) 20 MG tablet Take 20 mg by mouth daily.      Marland Kitchen enoxaparin (LOVENOX) 40 MG/0.4ML injection Inject 0.3 mLs (30 mg total) into the skin daily. Daily until 12/29/13. Last dose 01/29/14.  0 Syringe    . HYDROcodone-acetaminophen (NORCO/VICODIN) 5-325 MG per tablet Take 1 tablet by mouth every 6 (six) hours as needed for moderate pain.  30 tablet  0  . hydroxypropyl methylcellulose (ISOPTO TEARS) 2.5 % ophthalmic solution Place 1 drop into both eyes as needed for dry eyes.      Marland Kitchen ibuprofen (ADVIL,MOTRIN) 200 MG tablet Take 200 mg by mouth every 6 (six) hours as needed for mild pain.      . IRON PO Take 1 tablet by mouth daily.      . Multiple Vitamins-Minerals (ICAPS AREDS FORMULA PO) Take 1 tablet by mouth daily.      . Multiple Vitamins-Minerals (MULTIVITAMINS THER. W/MINERALS) TABS Take 1 tablet by mouth daily.      . polyethylene glycol (MIRALAX / GLYCOLAX) packet Take 17 g by mouth daily. Patient only puts a small amount in her coffee every morning.      . raloxifene (EVISTA) 60 MG tablet Take 60 mg by mouth daily.        Marland Kitchen SSD 1 % cream Apply 1 application topically daily. Apply to sore on right arm      . traMADol (ULTRAM) 50 MG tablet Take  1 tablet (50 mg total) by mouth every 6 (six) hours.  30 tablet  0   No current facility-administered medications on file prior to visit.   Social; patient lives in a [place] in [place]. She has around-the-clock sitters. By the tone of her discharge summary her niece is her RP She does not use ambulatory assist device although a walker was in the process of being ordered. History   Social History  . Marital Status: Widowed    Spouse Name: N/A    Number of Children: 0  . Years of Education: 7th   Occupational History  .     Social History Main Topics  . Smoking status: Never Smoker   . Smokeless tobacco: Not on file  . Alcohol Use: No     Comment: Patient drinks coffee daily.  . Drug Use: No  . Sexual Activity: No    Other Topics Concern  . Not on file   Social History Narrative  . No narrative on file   Family history; not available from any current source  Review of systems; really not possible from this patient secondary to language difficulties  Physical examination Respiratory; clear air entry bilaterally Cardiac she has a brisk palpable PMI. No murmurs no gallops no carotid bruits Abdomen; no liver no spleen no tenderness GU bladder is not distended there is no costovertebral angle tenderness Right hip incision is clean and well approximated. There is some minor swelling of the thigh in general but absolutely no evidence of a DVT Neurologic exam; deferred at this point I did not attempt to ambulate her Mental status; I think this lady is hampered by a hand significant aphasia, likely as a consequence of Alzheimer's disease. Her overall function is probably mild to moderate  Impression/plan #1 right femoral neck fracture status post bipolar arthroplasty. She is on Lovenox for DVT prophylaxis which in this case is appropriate #2 gait ataxia with recent falls. This is probably a consequence of advancing dementia although I would like to see her on her feet once things stabilize. #3 likely significant osteoporosis she is on Evista. I would wonder the contraindication is to a biphosphonate #4 some description of chest discomfort secondary to her preResults for [name], [name] (MRN [id number]) as of 01/09/2014 08:50 #5 postoperative anemia status post transfusion of one unit of packed red blood cells #6 tachycardia resolved after IV fluids and antibiotics #7 leukocytosis secondary to the UTI she is discharged on Ceftin which she'll continue for another 3 days #8 thrombocytopenia apparently chronic of uncertain etiology  Overall a patient with mild to moderate dementia, significant innominate aphasia, likely significant osteoporosis with a recent history of increasing gait ataxia. Would like to see  her on her feet when she stabilizes and begins physical therapy. 25-hydroxy vitamin D level is indicated

## 2014-01-12 ENCOUNTER — Ambulatory Visit: Payer: Medicare Other | Admitting: Diagnostic Neuroimaging

## 2014-01-16 ENCOUNTER — Non-Acute Institutional Stay (SKILLED_NURSING_FACILITY): Payer: Medicare Other | Admitting: Internal Medicine

## 2014-01-16 DIAGNOSIS — M810 Age-related osteoporosis without current pathological fracture: Secondary | ICD-10-CM | POA: Diagnosis not present

## 2014-01-16 DIAGNOSIS — R269 Unspecified abnormalities of gait and mobility: Secondary | ICD-10-CM

## 2014-01-16 NOTE — Progress Notes (Signed)
Patient ID: [name], female   DOB: 22-Oct-1924, 80 y.o.   MRN: [id number]

## 2014-01-18 NOTE — Progress Notes (Addendum)
Patient ID: [name], female   DOB: [birth year], 80 y.o.   MRN: [id number]                  PROGRESS NOTE  DATE:  01/16/2014    FACILITY: [facility]    LEVEL OF CARE:   SNF   Acute Visit   HISTORY OF PRESENT ILLNESS:  [name] is a frail lady who came to [place] after a fracture of her right femoral neck.  She has cognitive impairment and is cared for at home by caregivers.    Her lab work has come back showing a hemoglobin of 10.1, up from 9.2.  She also has a history of thrombocytopenia which was  apparently chronic.  However, her platelet count here was 209 last week and 316 here today, i.e. not a huge issue.  She is on Lovenox for DVT prophylaxis.    It has been pointed out by the consultant pharmacist that Raloxifene has a high venous thromboembolism risk and is probably not a good drug for her to be on in this timeframe, and I will probably stop this.  This can be resumed when she is more mobile.    Also, she is complaining about the Lovenox injections and I do not see a major reason why she could not be put on Xarelto.    Finally, her basic lab work shows an albumin of 2.2.  She has likely severe protein calorie malnutrition and I do not see any reason why she should be losing albumin.    PHYSICAL EXAMINATION:   CHEST/RESPIRATORY:  Exam is clear.   CARDIOVASCULAR:  CARDIAC:  No murmurs or bruits.   GASTROINTESTINAL:  ABDOMEN:   No masses.   LIVER/SPLEEN/KIDNEYS:  No liver, no spleen.    ASSESSMENT/PLAN:  Status post right femoral neck fracture.  She probably has severe osteoporosis.  However, Raloxifene has a high DVT risk.  I will put this on hold.  I will substitute Xarelto for Lovenox for her DVT prophylaxis.  Gait ataxia with several recent falls.  She has significant dementia, although I wonder if her protein calorie malnutrition was also making her weak and unsteady.

## 2014-01-23 ENCOUNTER — Ambulatory Visit (HOSPITAL_COMMUNITY)
Admit: 2014-01-23 | Discharge: 2014-01-23 | Disposition: A | Discharge status: Home / Self Care | Payer: Medicare Other | Source: Ambulatory Visit | Attending: Family Medicine | Admitting: Family Medicine

## 2014-01-23 ENCOUNTER — Ambulatory Visit (HOSPITAL_COMMUNITY)
Admit: 2014-01-23 | Discharge: 2014-01-23 | Disposition: A | Discharge status: Home / Self Care | Payer: No Typology Code available for payment source | Attending: Internal Medicine | Admitting: Internal Medicine

## 2014-01-23 DIAGNOSIS — R1311 Dysphagia, oral phase: Secondary | ICD-10-CM | POA: Insufficient documentation

## 2014-01-23 DIAGNOSIS — F0390 Unspecified dementia without behavioral disturbance: Secondary | ICD-10-CM | POA: Insufficient documentation

## 2014-01-23 DIAGNOSIS — IMO0001 Reserved for inherently not codable concepts without codable children: Secondary | ICD-10-CM | POA: Insufficient documentation

## 2014-01-23 DIAGNOSIS — R1310 Dysphagia, unspecified: Secondary | ICD-10-CM | POA: Insufficient documentation

## 2014-01-23 DIAGNOSIS — Z96649 Presence of unspecified artificial hip joint: Secondary | ICD-10-CM | POA: Insufficient documentation

## 2014-01-23 DIAGNOSIS — M1990 Unspecified osteoarthritis, unspecified site: Secondary | ICD-10-CM | POA: Insufficient documentation

## 2014-01-23 NOTE — Procedures (Signed)
Objective Swallowing Evaluation: Modified Barium Swallowing Study   Patient Details  Name: [name] MRN: [id number] Date of Birth: 1924-11-09  Today's Date: 01/23/2014 Time: 1:05 PM  - 1:33 PM    Past Medical History:  Past Medical History  Diagnosis Date  . Dementia   . Osteoarthritis    Past Surgical History:  Past Surgical History  Procedure Laterality Date  . Breast surgery    . Eye surgery    . Hip arthroplasty Right 01/01/2014    Procedure: BIPOLAR ARTHROPLASTY RIGHT HIP;  Surgeon: Carole Civil, MD;  Location: [facility];  Service: Orthopedics;  Laterality: Right;   HPI:  Ms. [name] is an 80 year old woman who was referred by Dr. Dellia Nims for MBSS due to frequent throat clearing post meals. She is currently at [facility] for rehab following a fall resulting in right subcapital femoral neck fracture (s/p bipolar arthroplasty of the right hip). Prior to hospital admission she was living in her own home in [place]. She apparently has around-the-clock sitters. Given this she is apparently independent enough to get dressed toilet her and bathe herself. According to the woman was here this morning she can even cook her own breakfast. She had been suffering falls and had been in the emergency department 2-3 weeks prior to this fall with a fall sustaining skin tears up.     Recommendation/Prognosis  Clinical Impression:   Dysphagia Diagnosis: Mild oral phase dysphagia;Mild pharyngeal phase dysphagia;Suspected primary esophageal dysphagia  Clinical impression: [name] presents with mild oral phase dysphagia characterized by reduced lingual movement resulting in decreased bolus cohesiveness, piecemeal deglutition, and lingual residuals; mild pharyngeal phase dysphagia characterized by premature spillage into pharynx with significant delay in swallow initiation triggering after filling the pyriforms with all textures and consistencies. Pt had significant oral residuals which  spilled to valleculae necessitating double swallows for each presentation. Pt denied stasis, however a second swallow was triggered which cleared the pharynx. Esophageal sweep revealed delayed emptying of esophagus when given several bites of puree. Pt did cough and clear her throat several times during the study, however no penetration/aspiration observed. It is possible that throat clearing and hoarse vocal quality are due to reflux (although not observed today). Recommend D3/mech soft with thin liquids; double swallows to clear. Follow up with treating SLP at [facility].    Swallow Evaluation Recommendations:  Diet Recommendations: Dysphagia 3 (Mechanical Soft);Thin liquid Liquid Administration via: Cup;Straw Medication Administration: Whole meds with liquid Supervision: Patient able to self feed Compensations: Multiple dry swallows after each bite/sip Postural Changes and/or Swallow Maneuvers: Out of bed for meals;Seated upright 90 degrees;Upright 30-60 min after meal Oral Care Recommendations: Oral care BID Other Recommendations: Clarify dietary restrictions Follow up Recommendations: Skilled Nursing facility    Prognosis:  Prognosis for Safe Diet Advancement: Good   Individuals Consulted: Consulted and Agree with Results and Recommendations: Patient;Other (Comment) (treating SLP) Report Sent to : Facility (Comment) [facility])    General: Date of Onset: 01/17/14 Type of Study: Modified Barium Swallowing Study Reason for Referral: Objectively evaluate swallowing function Previous Swallow Assessment: None on record Diet Prior to this Study: Dysphagia 3 (soft);Thin liquids Temperature Spikes Noted: No Respiratory Status: Room air History of Recent Intubation: No Behavior/Cognition: Alert;Cooperative;Pleasant mood;Requires cueing Oral Cavity - Dentition: Dentures, top;Dentures, bottom Oral Motor / Sensory Function: Within functional limits Self-Feeding Abilities: Able to feed self Patient  Positioning: Upright in chair Baseline Vocal Quality: Clear Volitional Swallow: Able to elicit Anatomy: Within functional limits Pharyngeal Secretions:  Not observed secondary MBS   Reason for Referral:   Objectively evaluate swallowing function    Oral Phase: Oral Preparation/Oral Phase Oral Phase: Impaired Oral - Nectar Oral - Nectar Cup: Weak lingual manipulation;Incomplete tongue to palate contact;Lingual/palatal residue;Piecemeal swallowing Oral - Thin Oral - Thin Cup: Weak lingual manipulation;Lingual/palatal residue;Piecemeal swallowing Oral - Thin Straw: Incomplete tongue to palate contact;Lingual/palatal residue;Piecemeal swallowing Oral - Solids Oral - Puree: Weak lingual manipulation;Lingual/palatal residue;Incomplete tongue to palate contact;Delayed oral transit;Piecemeal swallowing Oral - Regular: Weak lingual manipulation;Incomplete tongue to palate contact;Lingual/palatal residue;Piecemeal swallowing;Delayed oral transit Oral - Pill: Delayed oral transit   Pharyngeal Phase:  Pharyngeal Phase Pharyngeal Phase: Impaired Pharyngeal - Nectar Pharyngeal - Nectar Cup: Premature spillage to valleculae;Premature spillage to pyriform sinuses;Delayed swallow initiation;Pharyngeal residue - pyriform sinuses;Pharyngeal residue - valleculae;Lateral channel residue (clears with secondary swallow) Pharyngeal - Thin Pharyngeal - Thin Cup: Premature spillage to valleculae;Premature spillage to pyriform sinuses;Delayed swallow initiation;Pharyngeal residue - pyriform sinuses;Pharyngeal residue - valleculae;Lateral channel residue Pharyngeal - Thin Straw: Premature spillage to valleculae;Premature spillage to pyriform sinuses;Delayed swallow initiation;Pharyngeal residue - pyriform sinuses;Pharyngeal residue - valleculae;Lateral channel residue Pharyngeal - Solids Pharyngeal - Puree: Delayed swallow initiation;Premature spillage to valleculae;Premature spillage to pyriform sinuses;Pharyngeal  residue - valleculae Pharyngeal - Regular: Premature spillage to pyriform sinuses;Delayed swallow initiation;Pharyngeal residue - valleculae Pharyngeal - Pill: Within functional limits (took with thin)   Cervical Esophageal Phase  Cervical Esophageal Phase Cervical Esophageal Phase: [facility]   GN  Functional Assessment Tool Used: MBSS Functional Limitations: Swallowing Swallow Current Status (C5852): At least 1 percent but less than 20 percent impaired, limited or restricted Swallow Goal Status [id number]): At least 1 percent but less than 20 percent impaired, limited or restricted Swallow Discharge Status [phone]): At least 1 percent but less than 20 percent impaired, limited or restricted     Thank you,  Genene Churn, [place] 01/23/2014, 3:43 PM

## 2014-01-28 ENCOUNTER — Ambulatory Visit (HOSPITAL_COMMUNITY)
Admit: 2014-01-28 | Discharge: 2014-01-28 | Disposition: A | Discharge status: Home / Self Care | Payer: No Typology Code available for payment source | Source: Ambulatory Visit | Attending: Internal Medicine | Admitting: Internal Medicine

## 2014-01-28 DIAGNOSIS — S79929A Unspecified injury of unspecified thigh, initial encounter: Secondary | ICD-10-CM | POA: Diagnosis not present

## 2014-01-28 DIAGNOSIS — M25559 Pain in unspecified hip: Secondary | ICD-10-CM | POA: Diagnosis not present

## 2014-01-28 DIAGNOSIS — Z96649 Presence of unspecified artificial hip joint: Secondary | ICD-10-CM | POA: Insufficient documentation

## 2014-01-28 DIAGNOSIS — S79919A Unspecified injury of unspecified hip, initial encounter: Secondary | ICD-10-CM | POA: Diagnosis not present

## 2014-01-29 ENCOUNTER — Inpatient Hospital Stay (HOSPITAL_COMMUNITY): Payer: Medicare Other | Attending: Internal Medicine

## 2014-01-29 ENCOUNTER — Ambulatory Visit (HOSPITAL_COMMUNITY): Payer: Medicare Other | Attending: Internal Medicine

## 2014-01-29 DIAGNOSIS — T84049A Periprosthetic fracture around unspecified internal prosthetic joint, initial encounter: Secondary | ICD-10-CM | POA: Insufficient documentation

## 2014-01-29 DIAGNOSIS — Z9660 Presence of unspecified orthopedic joint implant: Principal | ICD-10-CM | POA: Insufficient documentation

## 2014-01-29 DIAGNOSIS — W19XXXA Unspecified fall, initial encounter: Secondary | ICD-10-CM | POA: Insufficient documentation

## 2014-01-29 DIAGNOSIS — Z471 Aftercare following joint replacement surgery: Secondary | ICD-10-CM | POA: Insufficient documentation

## 2014-01-29 DIAGNOSIS — Z96649 Presence of unspecified artificial hip joint: Secondary | ICD-10-CM | POA: Insufficient documentation

## 2014-01-29 DIAGNOSIS — J42 Unspecified chronic bronchitis: Secondary | ICD-10-CM | POA: Diagnosis not present

## 2014-01-29 DIAGNOSIS — S79919A Unspecified injury of unspecified hip, initial encounter: Secondary | ICD-10-CM | POA: Diagnosis not present

## 2014-01-30 ENCOUNTER — Telehealth: Payer: Self-pay | Admitting: Orthopedic Surgery

## 2014-01-30 ENCOUNTER — Ambulatory Visit: Payer: Medicare Other | Admitting: Orthopedic Surgery

## 2014-01-30 ENCOUNTER — Non-Acute Institutional Stay (SKILLED_NURSING_FACILITY): Payer: Medicare Other | Admitting: Internal Medicine

## 2014-01-30 DIAGNOSIS — Z9660 Presence of unspecified orthopedic joint implant: Principal | ICD-10-CM

## 2014-01-30 DIAGNOSIS — T84049A Periprosthetic fracture around unspecified internal prosthetic joint, initial encounter: Secondary | ICD-10-CM | POA: Diagnosis not present

## 2014-01-30 NOTE — Telephone Encounter (Signed)
Chart reviewed including x-rays and CT scan. Prosthesis appears to be stable after periprosthetic fracture however further imaging will be required on a weekly basis. If it is determined that the prosthesis is loose then further surgery will be needed. This will need to be followed closely Will discuss with nursing home

## 2014-01-30 NOTE — Telephone Encounter (Signed)
Noted  

## 2014-01-30 NOTE — Telephone Encounter (Signed)
Call received from Santiago Glad at [facility] to relay that patient has had another fall, Date of injury 01/26/14, and had been sent for Xrays, CT, and chest Xray at [facility]; states patient has a new fracture of right hip and fracture of ribs.  She is back at [facility], and was scheduled to be here at our office for a post op visit from her surgery (Right Bipolar hip 01/01/14).  Please review Xrays and please advise.  Karen's cell ph# is [phone]; facility ph# is [phone].

## 2014-02-01 ENCOUNTER — Other Ambulatory Visit: Payer: Self-pay | Admitting: *Deleted

## 2014-02-01 DIAGNOSIS — I10 Essential (primary) hypertension: Secondary | ICD-10-CM | POA: Diagnosis not present

## 2014-02-01 MED ORDER — HYDROCODONE-ACETAMINOPHEN 5-325 MG PO TABS
1.0000 | ORAL_TABLET | Freq: Four times a day (QID) | ORAL | Status: DC | PRN
Start: 2014-02-01 — End: 2014-03-03

## 2014-02-01 NOTE — Progress Notes (Addendum)
Patient ID: CATHELEEN LANGHORNE, female   DOB: Mar 01, 1925, 80 y.o.   MRN: [id number]                  PROGRESS NOTE  DATE:  01/30/2014     FACILITY: [facility]    LEVEL OF CARE:   SNF   Acute Visit   CHIEF COMPLAINT:  Review, status post fall over the weekend.    HISTORY OF PRESENT ILLNESS:  Mrs. Barra is a patient who came to [place] after suffering a right femoral neck fracture.  She underwent a bipolar arthroplasty of the right hip.    Two days ago, she suffered a fall  apparently out of her wheelchair while trying to get back into bed.  She had an x-ray and then a CT scan of the hip.  The CT scan of the hip shows a right hip hemiarthroplasty complicated by a periprosthetic fracture.  There is mild distraction of the anterior fracture point without significant displacement.  The acetabular component remains located.  The visible pelvis was without fracture.    The facility has already talked to Dr. Aline Brochure.  This is a difficult orthopedic issue.  This will either heal on its own or not.  He has asked for serial x-rays, bed rest.  I will maintain her on Xarelto.     PHYSICAL EXAMINATION:   MUSCULOSKELETAL:  EXTREMITIES:   RIGHT LOWER EXTREMITY:  Right leg:  There is no significant hematoma or bruising.  However, the leg is rotated to the right.   CARDIOVASCULAR:  CARDIAC:   Heart sounds are normal.  There are no murmurs.  She appears to be euvolemic.    ASSESSMENT/PLAN:  Right periprosthetic hip fracture.   I have ordered serial x-rays for Dr. Ruthe Mannan review.  Maintain her on Xarelto.

## 2014-02-02 ENCOUNTER — Telehealth: Payer: Self-pay | Admitting: Orthopedic Surgery

## 2014-02-02 NOTE — Telephone Encounter (Signed)
Noted.  Faxed response to [facility].

## 2014-02-02 NOTE — Telephone Encounter (Signed)
If there are any problems send to ER for ER evaluation

## 2014-02-02 NOTE — Telephone Encounter (Signed)
Call from [place] patient was found standing in her room, per [place], initially, and another call received from [place]. Please advise, if any new orders or Xrays needed. Please ask for patient's nurse - may be [place] or [place] - ph [phone].

## 2014-02-06 ENCOUNTER — Ambulatory Visit (HOSPITAL_COMMUNITY): Payer: Medicare Other | Attending: Internal Medicine

## 2014-02-06 DIAGNOSIS — I739 Peripheral vascular disease, unspecified: Secondary | ICD-10-CM | POA: Insufficient documentation

## 2014-02-06 DIAGNOSIS — Z96649 Presence of unspecified artificial hip joint: Secondary | ICD-10-CM | POA: Diagnosis not present

## 2014-02-06 DIAGNOSIS — Z4789 Encounter for other orthopedic aftercare: Secondary | ICD-10-CM | POA: Insufficient documentation

## 2014-02-06 DIAGNOSIS — S72409A Unspecified fracture of lower end of unspecified femur, initial encounter for closed fracture: Secondary | ICD-10-CM | POA: Diagnosis not present

## 2014-02-07 ENCOUNTER — Telehealth: Payer: Self-pay | Admitting: Orthopedic Surgery

## 2014-02-07 NOTE — Telephone Encounter (Signed)
Tell her I ve reviwed the x rays and she will need a referral to [facility] or [facility] ortho

## 2014-02-07 NOTE — Telephone Encounter (Signed)
Forwarding Dr Ruthe Mannan response and instructions to nurse.

## 2014-02-07 NOTE — Telephone Encounter (Signed)
[place], called to relay that patient had been taken to [facility] for [place], as noted in [employer].  Copy of Xray result had been placed in Dr's box.  Nurse Minette Brine states patient seems to be "worse" - remains on bedrest.  Please advise.  Ph # [phone].

## 2014-02-08 NOTE — Telephone Encounter (Signed)
Olivia at [facility] advised

## 2014-02-08 NOTE — Telephone Encounter (Signed)
The same thing they called me for  Really????? Are they for real ??  They will

## 2014-02-08 NOTE — Telephone Encounter (Signed)
Dorisann Frames, she wants to know what the referral is for and will our office be referring or does the [facility] handle referral?

## 2014-02-10 ENCOUNTER — Telehealth: Payer: Self-pay | Admitting: Orthopedic Surgery

## 2014-02-10 NOTE — Telephone Encounter (Signed)
Patient's sister, Phillis Knack, ph [phone], had called, asking for an update of status on her sister, Ms. Christine Morton; states she had called [facility], and that she was told to contact our office for Xray results.  I relayed that I personally was unable to relay results; in addition, we are not locating a power of attorney statement or other release of information.  Ms. Tamala Julian was in turn going to therefore contact [place] to speak with nurse supervisor for further information.  Minette Brine from [place] called back a few minutes later, relaying that patient's sister called the Director of Nursing.  Minette Brine is asking if they can release the Xray results to her sister, who is on their release of information list.

## 2014-02-11 NOTE — Telephone Encounter (Signed)
YES

## 2014-02-13 ENCOUNTER — Ambulatory Visit (HOSPITAL_COMMUNITY): Payer: Medicare Other | Attending: Internal Medicine

## 2014-02-13 DIAGNOSIS — M25559 Pain in unspecified hip: Secondary | ICD-10-CM | POA: Insufficient documentation

## 2014-02-14 NOTE — Telephone Encounter (Signed)
Per [place] Cerise, LPN, relayed this information to [facility] per Dr Ruthe Mannan note.

## 2014-02-17 DIAGNOSIS — F0390 Unspecified dementia without behavioral disturbance: Secondary | ICD-10-CM | POA: Diagnosis not present

## 2014-02-17 DIAGNOSIS — T84049A Periprosthetic fracture around unspecified internal prosthetic joint, initial encounter: Secondary | ICD-10-CM | POA: Diagnosis not present

## 2014-02-17 DIAGNOSIS — Z9660 Presence of unspecified orthopedic joint implant: Secondary | ICD-10-CM | POA: Diagnosis not present

## 2014-02-17 DIAGNOSIS — Z96649 Presence of unspecified artificial hip joint: Secondary | ICD-10-CM | POA: Diagnosis not present

## 2014-02-20 ENCOUNTER — Ambulatory Visit (HOSPITAL_COMMUNITY): Payer: Medicare Other | Attending: Internal Medicine

## 2014-02-20 DIAGNOSIS — M25559 Pain in unspecified hip: Secondary | ICD-10-CM | POA: Insufficient documentation

## 2014-03-01 DIAGNOSIS — Z96649 Presence of unspecified artificial hip joint: Secondary | ICD-10-CM | POA: Diagnosis not present

## 2014-03-01 DIAGNOSIS — E559 Vitamin D deficiency, unspecified: Secondary | ICD-10-CM | POA: Diagnosis not present

## 2014-03-01 DIAGNOSIS — T84049A Periprosthetic fracture around unspecified internal prosthetic joint, initial encounter: Secondary | ICD-10-CM | POA: Diagnosis not present

## 2014-03-01 DIAGNOSIS — M2550 Pain in unspecified joint: Secondary | ICD-10-CM | POA: Diagnosis not present

## 2014-03-01 DIAGNOSIS — F0390 Unspecified dementia without behavioral disturbance: Secondary | ICD-10-CM | POA: Diagnosis not present

## 2014-03-01 DIAGNOSIS — Z7401 Bed confinement status: Secondary | ICD-10-CM | POA: Diagnosis not present

## 2014-03-02 DIAGNOSIS — F329 Major depressive disorder, single episode, unspecified: Secondary | ICD-10-CM | POA: Diagnosis not present

## 2014-03-02 DIAGNOSIS — F3289 Other specified depressive episodes: Secondary | ICD-10-CM | POA: Diagnosis not present

## 2014-03-03 ENCOUNTER — Encounter: Payer: Self-pay | Admitting: Internal Medicine

## 2014-03-03 ENCOUNTER — Non-Acute Institutional Stay (SKILLED_NURSING_FACILITY): Payer: Medicare Other | Admitting: Internal Medicine

## 2014-03-03 DIAGNOSIS — F0390 Unspecified dementia without behavioral disturbance: Secondary | ICD-10-CM | POA: Diagnosis not present

## 2014-03-03 DIAGNOSIS — I10 Essential (primary) hypertension: Secondary | ICD-10-CM

## 2014-03-03 DIAGNOSIS — D62 Acute posthemorrhagic anemia: Secondary | ICD-10-CM

## 2014-03-03 DIAGNOSIS — S72009D Fracture of unspecified part of neck of unspecified femur, subsequent encounter for closed fracture with routine healing: Secondary | ICD-10-CM

## 2014-03-03 DIAGNOSIS — D696 Thrombocytopenia, unspecified: Secondary | ICD-10-CM | POA: Diagnosis not present

## 2014-03-03 DIAGNOSIS — S72001G Fracture of unspecified part of neck of right femur, subsequent encounter for closed fracture with delayed healing: Secondary | ICD-10-CM

## 2014-03-03 NOTE — Progress Notes (Signed)
Patient ID: [name], female   DOB: 24-Dec-1924, 80 y.o.   MRN: [id number]   This is a routine visit.  Level of care skilled  Facility; [facility]   Chief complaint; management of right hip fracture-anemia-dementia-osteoarthritis  History; this is an 80 year old woman who lives in her own home in [place]. She apparently has around-the-clock sitters. Given this she is apparently independent enough to get dressed toilet her and bathe herself.. She had been suffering falls and had been in the emergency department 2-3 weeks prior to this fall with a fall sustaining skin tears up. On this occasion she got up from bed to go to the bathroom lost her balance and fell backwards hitting her head and her right hip. She was found to have a right subcapital femoral neck fracture. There was no history of loss of consciousness. The patient underwent a bipolar arthroplasty of the right hip. There does not appear to have been any postoperative complications This was complicated somewhat with a fall out of her wheelchair-study showed a periprosthetic fracture-this had been followed by Dr. Aline Brochure -- and now is followed by a doctor in [facility] per apparently Dr. Aline Brochure recommendation.  She continues on  Xarelto for anticoagulation He had just seen her orthopedic doctor in [facility] who has recommended spacing out her narcotic medications apparently because of family concerns of some oversedation.  She is receiving Norco 05-325 milligrams every 6 hours when necessary as well as tramadol 50 mg every 6 hours when necessary-.  Currently she is not complaining of pain.  Apparently she had some elevated blood pressures in the hospital this was thought secondary to pain-I took her blood pressure manually today and got 134/70-however I do see some elevated systolics in the 242A even 180s at times I suspect this was taken by the machine Her blood pressures appear to be quite variable  She is seen by  psychiatric services and they are transitioning her from Celexa to Effexor  She appears to be in good spirits today bright alert although somewhat confused.       Past Medical History   Diagnosis  Date   .  Dementia    .  Osteoarthritis     Past Surgical History   Procedure  Laterality  Date   .  Breast surgery     .  Eye surgery     .  Hip arthroplasty  Right  01/01/2014     Procedure: BIPOLAR ARTHROPLASTY RIGHT HIP; Surgeon: Carole Civil, MD; Location: [facility]; Service: Orthopedics; Laterality: Right;                                .       .       .     0   .       .        .        .       .       .       .       .       .           Social; patient lived in a single-story house in [place]. She ha around-the-clock sitters.   She did not use ambulatory assist device although a walker was in the process of being ordered.  History    Social  History   .  Marital Status:  Widowed     Spouse Name:  N/A     Number of Children:  0   .  Years of Education:  7th    Occupational History   .      Social History Main Topics   .  Smoking status:  Never Smoker   .  Smokeless tobacco:  Not on file   .  Alcohol Use:  No      Comment: Patient drinks coffee daily.   .  Drug Use:  No   .  Sexual Activity:  No    Other Topics  Concern   .  Not on file    Social History Narrative   .  No narrative on file   Family history; not available from any current source  Review of systems; .  In general does not complaining of any fever or chills.  Respiratory does likely shortness breath or cough.  Cardiac no chest pain or significant lower extremity edema.  GI does not complaining of any abdominal pain nausea or vomiting diarrhea or constipation. She had a fairly decent appetite.  Muscle skeletal currently not complaining of any hip or joint pain apparently she has in the past however there is a recommendation from surgeon to try to space out her narcotics  more we will do that  Neurologic does not complain of dizziness or headache or numbness.  Psych she does have some history of dementia is pleasant but somewhat confused-history of depression   Physical examination  Temperature 97.7 pulse 90 respirations 18 blood pressure 134/70 manually.  In general this is a pleasant elderly female in no distress lying in bed comfortably.  Her skin is warm and dry has a well-healed surgical scar right hip.  Eyes pupils appear reactive to light sclera and conjunctiva are clear she has prescription lenses visual acuity appears grossly intact.  Oropharynx is clear mucous membranes moist.  Heart is regular rate and rhythm without murmur gallop or rub she does not really have significant lower extremity edema pedal pulses are intact bilaterally.  Chest is clear to auscultation no labored breathing.  Abdomen is soft nontender with positive bowel sounds.  Muscle skeletal I did not really note any deformities-Limited examination she is in bed but appears able to move her upper extremities at baseline again had significant orthopedic issue of her right hip--and continues with lower extremity weak  Neurologic is grossly intact her speech is clear.  Psych she is oriented to self is pleasant and appropriate although somewhat confused somewhat aphasic which Dr. Dellia Nims as noted previously as well I would say her dementia is moderate  Labs.  02/01/2014.  WBC 6.5 hemoglobin 9.0 platelets 128.  Sodium 138 potassium 5 BUN 13 creatinine 0.55    Impression/plan   #1 right femoral neck fracture status post bipolar arthroplasty. With periprosthetic fracture-again this is followed as noted above by orthopedics-she is on Xarelto for anticoagulation-. Apparently a surgery is scheduled for next month  In regards to pain  appears to be comfortable currently-will space out her Norco up to twice a day when necessary and monitor  #2 gait ataxia with recent falls.  This is probably a consequence of advancing dementia --she is not ambulatory at present certainly because of #1   #3 hypertension-she appears to have variable blood pressures will order blood pressure and pulses every shift with a log for provider review next week  History of postop anemia-last hemoglobin  was 9.0 we will update this-she is on iron    #5 thrombocytopenia apparently chronic of uncertain etiology  #6-QIW has a history of a low vitamin D level this is being supplemented  #7-dementia this appears to be moderate-she appears to be doing relatively well in this setting continue to monitor she is seen by psychiatric services as well.  #8 depression she is being transitioned from Celexa to [facility] appeared to be good spirits today again she is followed by psychiatric services  [phone]

## 2014-03-06 DIAGNOSIS — D649 Anemia, unspecified: Secondary | ICD-10-CM | POA: Diagnosis not present

## 2014-03-06 DIAGNOSIS — I10 Essential (primary) hypertension: Secondary | ICD-10-CM | POA: Diagnosis not present

## 2014-03-07 ENCOUNTER — Other Ambulatory Visit: Payer: Self-pay | Admitting: *Deleted

## 2014-03-07 MED ORDER — HYDROCODONE-ACETAMINOPHEN 5-325 MG PO TABS: ORAL_TABLET | ORAL | Status: DC

## 2014-03-07 NOTE — Telephone Encounter (Signed)
[facility] 

## 2014-03-08 ENCOUNTER — Ambulatory Visit: Payer: Medicare Other | Admitting: Diagnostic Neuroimaging

## 2014-03-13 DIAGNOSIS — F329 Major depressive disorder, single episode, unspecified: Secondary | ICD-10-CM | POA: Diagnosis not present

## 2014-03-13 DIAGNOSIS — F3289 Other specified depressive episodes: Secondary | ICD-10-CM | POA: Diagnosis not present

## 2014-03-13 DIAGNOSIS — F0390 Unspecified dementia without behavioral disturbance: Secondary | ICD-10-CM | POA: Diagnosis not present

## 2014-03-16 DIAGNOSIS — D696 Thrombocytopenia, unspecified: Secondary | ICD-10-CM | POA: Diagnosis not present

## 2014-03-16 DIAGNOSIS — Z9181 History of falling: Secondary | ICD-10-CM | POA: Diagnosis not present

## 2014-03-16 DIAGNOSIS — R489 Unspecified symbolic dysfunctions: Secondary | ICD-10-CM | POA: Diagnosis not present

## 2014-03-16 DIAGNOSIS — M2550 Pain in unspecified joint: Secondary | ICD-10-CM | POA: Diagnosis not present

## 2014-03-16 DIAGNOSIS — R279 Unspecified lack of coordination: Secondary | ICD-10-CM | POA: Diagnosis not present

## 2014-03-16 DIAGNOSIS — F329 Major depressive disorder, single episode, unspecified: Secondary | ICD-10-CM | POA: Diagnosis not present

## 2014-03-16 DIAGNOSIS — S72009A Fracture of unspecified part of neck of unspecified femur, initial encounter for closed fracture: Secondary | ICD-10-CM | POA: Diagnosis not present

## 2014-03-16 DIAGNOSIS — M129 Arthropathy, unspecified: Secondary | ICD-10-CM | POA: Diagnosis not present

## 2014-03-16 DIAGNOSIS — M25559 Pain in unspecified hip: Secondary | ICD-10-CM | POA: Diagnosis not present

## 2014-03-16 DIAGNOSIS — F3289 Other specified depressive episodes: Secondary | ICD-10-CM | POA: Diagnosis not present

## 2014-03-16 DIAGNOSIS — R1310 Dysphagia, unspecified: Secondary | ICD-10-CM | POA: Diagnosis not present

## 2014-03-16 DIAGNOSIS — Z7401 Bed confinement status: Secondary | ICD-10-CM | POA: Diagnosis not present

## 2014-03-16 DIAGNOSIS — R262 Difficulty in walking, not elsewhere classified: Secondary | ICD-10-CM | POA: Diagnosis not present

## 2014-03-16 DIAGNOSIS — D62 Acute posthemorrhagic anemia: Secondary | ICD-10-CM | POA: Diagnosis not present

## 2014-03-16 DIAGNOSIS — T84049A Periprosthetic fracture around unspecified internal prosthetic joint, initial encounter: Secondary | ICD-10-CM | POA: Diagnosis not present

## 2014-03-16 DIAGNOSIS — Z471 Aftercare following joint replacement surgery: Secondary | ICD-10-CM | POA: Diagnosis not present

## 2014-03-16 DIAGNOSIS — D72829 Elevated white blood cell count, unspecified: Secondary | ICD-10-CM | POA: Diagnosis not present

## 2014-03-16 DIAGNOSIS — S7290XA Unspecified fracture of unspecified femur, initial encounter for closed fracture: Secondary | ICD-10-CM | POA: Diagnosis present

## 2014-03-16 DIAGNOSIS — D649 Anemia, unspecified: Secondary | ICD-10-CM | POA: Diagnosis not present

## 2014-03-16 DIAGNOSIS — S72009D Fracture of unspecified part of neck of unspecified femur, subsequent encounter for closed fracture with routine healing: Secondary | ICD-10-CM | POA: Diagnosis not present

## 2014-03-16 DIAGNOSIS — M6281 Muscle weakness (generalized): Secondary | ICD-10-CM | POA: Diagnosis not present

## 2014-03-16 DIAGNOSIS — Z96649 Presence of unspecified artificial hip joint: Secondary | ICD-10-CM | POA: Diagnosis not present

## 2014-03-16 DIAGNOSIS — G8918 Other acute postprocedural pain: Secondary | ICD-10-CM | POA: Diagnosis not present

## 2014-03-16 DIAGNOSIS — F0390 Unspecified dementia without behavioral disturbance: Secondary | ICD-10-CM | POA: Diagnosis present

## 2014-03-21 ENCOUNTER — Inpatient Hospital Stay
Admission: RE | Admit: 2014-03-21 | Discharge: 2014-06-13 | Disposition: A | Discharge status: Home / Self Care | Payer: Medicare Other | Source: Ambulatory Visit | Attending: Internal Medicine | Admitting: Internal Medicine

## 2014-03-21 DIAGNOSIS — F0390 Unspecified dementia without behavioral disturbance: Secondary | ICD-10-CM | POA: Diagnosis not present

## 2014-03-21 DIAGNOSIS — Z4789 Encounter for other orthopedic aftercare: Secondary | ICD-10-CM | POA: Diagnosis not present

## 2014-03-21 DIAGNOSIS — S72009A Fracture of unspecified part of neck of unspecified femur, initial encounter for closed fracture: Secondary | ICD-10-CM | POA: Diagnosis not present

## 2014-03-21 DIAGNOSIS — Z9181 History of falling: Secondary | ICD-10-CM | POA: Diagnosis not present

## 2014-03-21 DIAGNOSIS — D696 Thrombocytopenia, unspecified: Secondary | ICD-10-CM | POA: Diagnosis not present

## 2014-03-21 DIAGNOSIS — F329 Major depressive disorder, single episode, unspecified: Secondary | ICD-10-CM | POA: Diagnosis not present

## 2014-03-21 DIAGNOSIS — F0280 Dementia in other diseases classified elsewhere without behavioral disturbance: Secondary | ICD-10-CM | POA: Diagnosis not present

## 2014-03-21 DIAGNOSIS — Z96649 Presence of unspecified artificial hip joint: Secondary | ICD-10-CM | POA: Diagnosis not present

## 2014-03-21 DIAGNOSIS — G309 Alzheimer's disease, unspecified: Secondary | ICD-10-CM | POA: Diagnosis not present

## 2014-03-21 DIAGNOSIS — R279 Unspecified lack of coordination: Secondary | ICD-10-CM | POA: Diagnosis not present

## 2014-03-21 DIAGNOSIS — R489 Unspecified symbolic dysfunctions: Secondary | ICD-10-CM | POA: Diagnosis not present

## 2014-03-21 DIAGNOSIS — D649 Anemia, unspecified: Secondary | ICD-10-CM | POA: Diagnosis not present

## 2014-03-21 DIAGNOSIS — F3289 Other specified depressive episodes: Secondary | ICD-10-CM | POA: Diagnosis not present

## 2014-03-21 DIAGNOSIS — S72009D Fracture of unspecified part of neck of unspecified femur, subsequent encounter for closed fracture with routine healing: Secondary | ICD-10-CM | POA: Diagnosis not present

## 2014-03-21 DIAGNOSIS — R41841 Cognitive communication deficit: Secondary | ICD-10-CM | POA: Diagnosis not present

## 2014-03-21 DIAGNOSIS — T84049A Periprosthetic fracture around unspecified internal prosthetic joint, initial encounter: Secondary | ICD-10-CM | POA: Diagnosis not present

## 2014-03-21 DIAGNOSIS — M25559 Pain in unspecified hip: Secondary | ICD-10-CM | POA: Diagnosis not present

## 2014-03-21 DIAGNOSIS — M6281 Muscle weakness (generalized): Secondary | ICD-10-CM | POA: Diagnosis not present

## 2014-03-21 DIAGNOSIS — M129 Arthropathy, unspecified: Secondary | ICD-10-CM | POA: Diagnosis not present

## 2014-03-21 DIAGNOSIS — R410 Disorientation, unspecified: Secondary | ICD-10-CM | POA: Diagnosis not present

## 2014-03-21 DIAGNOSIS — Z9660 Presence of unspecified orthopedic joint implant: Secondary | ICD-10-CM | POA: Diagnosis not present

## 2014-03-21 DIAGNOSIS — D72829 Elevated white blood cell count, unspecified: Secondary | ICD-10-CM | POA: Diagnosis not present

## 2014-03-21 DIAGNOSIS — M159 Polyosteoarthritis, unspecified: Secondary | ICD-10-CM | POA: Diagnosis not present

## 2014-03-21 DIAGNOSIS — Z471 Aftercare following joint replacement surgery: Secondary | ICD-10-CM | POA: Diagnosis not present

## 2014-03-21 DIAGNOSIS — R1310 Dysphagia, unspecified: Secondary | ICD-10-CM | POA: Diagnosis not present

## 2014-03-21 DIAGNOSIS — R262 Difficulty in walking, not elsewhere classified: Secondary | ICD-10-CM | POA: Diagnosis not present

## 2014-03-21 DIAGNOSIS — D509 Iron deficiency anemia, unspecified: Secondary | ICD-10-CM | POA: Diagnosis not present

## 2014-03-22 ENCOUNTER — Non-Acute Institutional Stay (SKILLED_NURSING_FACILITY): Payer: Medicare Other | Admitting: Internal Medicine

## 2014-03-22 DIAGNOSIS — S72009D Fracture of unspecified part of neck of unspecified femur, subsequent encounter for closed fracture with routine healing: Secondary | ICD-10-CM

## 2014-03-22 DIAGNOSIS — R410 Disorientation, unspecified: Secondary | ICD-10-CM | POA: Diagnosis not present

## 2014-03-22 DIAGNOSIS — T84049A Periprosthetic fracture around unspecified internal prosthetic joint, initial encounter: Secondary | ICD-10-CM

## 2014-03-22 DIAGNOSIS — M810 Age-related osteoporosis without current pathological fracture: Secondary | ICD-10-CM

## 2014-03-22 DIAGNOSIS — G309 Alzheimer's disease, unspecified: Secondary | ICD-10-CM

## 2014-03-22 DIAGNOSIS — F0280 Dementia in other diseases classified elsewhere without behavioral disturbance: Secondary | ICD-10-CM | POA: Diagnosis not present

## 2014-03-22 DIAGNOSIS — S72001G Fracture of unspecified part of neck of right femur, subsequent encounter for closed fracture with delayed healing: Secondary | ICD-10-CM

## 2014-03-22 DIAGNOSIS — Z9660 Presence of unspecified orthopedic joint implant: Secondary | ICD-10-CM

## 2014-03-22 DIAGNOSIS — F05 Delirium due to known physiological condition: Secondary | ICD-10-CM

## 2014-03-22 NOTE — Progress Notes (Signed)
Patient ID: [name], female   DOB: [birth year], 80 y.o.   MRN: [id number]  Facility: [facility] Chief complaint; readmission to the facility stay at [facility] 8/13 through 8/18 History; this is a patient that we admitted to this facility initially at the end of June. She suffered a fall at home in the bathroom and a left femoral neck fracture. She underwent a left hemiarthroplasty by Dr. Aline Brochure. It's postoperatively she suffered a fall with a periprosthetic fracture. Given the severity of her underlying dementia and attempt was made to treat this conservatively and she was put on bed rest. Unfortunately she was not able to maintain nonweightbearing restrictions and the fracture displaced. She was referred to [facility] for a total hip arthroplasty. The patient underwent the procedure on 8/14. She was started on xarelto for DVT prophylaxis. There are orders for touch toe weightbearing which I doubt can be enforced in any meaningful way. The patient is simply not going to be able to understand these restrictions. If there is not an orthopedic reason to attempt to maintain this then I would favor an attempt at rehabilitation now which wouldn't mean full weight bearing on the right side. The patient is simply not going to be able to maintain any other restrictions.  Past Medical History  Diagnosis Date  . Dementia   . Osteoarthritis    Past Surgical History  Procedure Laterality Date  . Breast surgery    . Eye surgery    . Hip arthroplasty Right 01/01/2014    Procedure: BIPOLAR ARTHROPLASTY RIGHT HIP;  Surgeon: Carole Civil, MD;  Location: [facility];  Service: Orthopedics;  Laterality: Right; Rt total Hip Revisiion 8/14   Current medications Ultram one to 2 tablets every 6 hours when necessary vitamin D 3000 units daily Celexa 20 mg daily Iron Carbonell/vitamin C/vitamin B12 daily MiraLax 17 g daily Xarelto 10 mg daily for DVT prophylaxis  Social; The patient was  originally living in her home home before her original fracture and surgery at the end of June. According to family she was able to ambulate, feed herself, participate in ADLs. She was continent of bowel and bladder. She had fairly constant in home support  reports that she has never smoked. She does not have any smokeless tobacco history on file. She reports that she does not drink alcohol or use illicit drugs.  Review of systems Gen. the patient is apparently still confused per her description of her family. However she is eating well Mental status again according to family she is still suffering from postoperative confusion  Physical examination. Pulse rate 69 respirations 18 O2 sat 95% on room air Gen. the patient does not look to be in any distress she is awake and responds but does not follow any commands Respiratory clear entry bilaterally Cardiac; she appears to be euvolemic heart sounds are normal there is no murmurs Abdomen no liver no spleen no tenderness GU no bladder distention no CVA tenderness Extremities no edema no evidence of a DVT. I did not look at her surgical incision Mental status; the patient is unable to follow even simple direction churches open her mouth or taking deep breaths.. by description of the family this is probably much worse than her baseline function prior to her original surgery.  Impression/plan #1 status post revision of her original right total hip arthroplasty [see discussion above]. The patient appears to have tolerated the procedure well. She is on xarelto for DVT prophylaxis. #2  postoperative delirium; I will recheck her electrolytes. However I think this is mostly general anesthesia in a patient with probably background moderate to severe dementia. Release some of this will clear #3 moderate to severe dementia preoperatively however the patient was still functional i.e. ambulatory not in continent and able to participate in her ADLs. She had fairly  significant in home support however. In discussion with her family they are willing to continue this but would like her to be able to transfer. #4 patch no weightbearing restrictions are simply not going to be able to be maintained we'll recheck orthopedics to see if it would be safe for full weight bearing to see if she can have a legitimate attempt at rehabilitation #5 probable osteoporosis; she is on vitamin D 3 if she is not on calcium I will restart this. She probably should be on an ancillary agent for treatment  The major issue however he is being able to ambulate this patient as best we can. She is already had more than a  month ago or more worth of non-ambulation.

## 2014-03-23 ENCOUNTER — Non-Acute Institutional Stay (SKILLED_NURSING_FACILITY): Payer: Medicare Other | Admitting: Internal Medicine

## 2014-03-23 DIAGNOSIS — D509 Iron deficiency anemia, unspecified: Secondary | ICD-10-CM | POA: Diagnosis not present

## 2014-03-26 ENCOUNTER — Encounter: Payer: Self-pay | Admitting: Internal Medicine

## 2014-03-26 NOTE — Progress Notes (Signed)
Patient ID: [name], female   DOB: 12-13-1924, 80 y.o.   MRN: [id number]   this is an acute visit.  Level of care skilled.  Facility [facility].  Chief complaint acute visit secondary to anemia.   HPI  ; this is a patient that we admitted to this facility initially at the end of June. She suffered a fall at home in the bathroom and a left femoral neck fracture. She underwent a left hemiarthroplasty by Dr. Aline Brochure. It's postoperatively she suffered a fall with a periprosthetic fracture. Given the severity of her underlying dementia and attempt was made to treat this conservatively and she was put on bed rest. Unfortunately she was not able to maintain nonweightbearing restrictions and the fracture displaced. She was referred to [facility] for a total hip arthroplasty. The patient underwent the procedure on 8/14. She was started on xarelto for DVT prophylaxis  Updated lab shows a hemoglobin of 7.5-it appears baseline hemoglobin is around 9 before her operation-she is on iron--she does not appear to be overtly symptomatic vital signs are stable.  .  Past Medical History   Diagnosis  Date   .  Dementia    .  Osteoarthritis     Past Surgical History   Procedure  Laterality  Date   .  Breast surgery     .  Eye surgery     .  Hip arthroplasty  Right  01/01/2014     Procedure: BIPOLAR ARTHROPLASTY RIGHT HIP; Surgeon: Carole Civil, MD; Location: [facility]; Service: Orthopedics; Laterality: Right;  Rt total Hip Revisiion 8/14   Current medications reviewed per MAR-of note she is on iron-she is on  Xarelto for anticoagulation     Social;  The patient was originally living in her home home before her original fracture and surgery at the end of June. According to family she was able to ambulate, feed herself, participate in ADLs. She was continent of bowel and bladder. She had fairly constant in home support  reports that she has never smoked. She does not have any smokeless tobacco  history on file. She reports that she does not drink alcohol or use illicit drugs.   Review of systems   Essentially unobtainable secondary to dementia-according to nursing staff he appears comfortable there been no acute issues or complaints of shortness of breath or chest pain or acute hip pain  Physical examination.  Temperature 97.8 pulse 89 respirations 20 blood pressure 143/68 Gen. the patient does not look to be in any distress she is awake and responds but does not follow any commands  Respiratory clear entry bilaterally  Cardiac; she appears to be euvolemic heart sounds are normal some irregular beats there is no murmurs  Abdomen no liver no spleen no tenderness   Extremities no edema no evidence of a DVT.   Mental status; the patient is unable to follow even simple direction churches open her mouth or taking deep breaths.  Labs.  03/23/2014.  Sodium 139 potassium 4.1 BUN 11 creatinine 0.65.  WBC 5.0 hemoglobin 7.5 platelets 222.   .  Impression/plan  1--anemia-likely postop-she is on iron-at this point will monitor -- update a CBC tomorrow as well as Monday, August 24 --this was discussed with Dr. Dellia Nims via phone  2 status post revision of her original right total hip arthroplasty. The patient appears to have tolerated the procedure well. She is on xarelto for DVT prophylaxis.  [id number]

## 2014-03-30 DIAGNOSIS — Z96649 Presence of unspecified artificial hip joint: Secondary | ICD-10-CM | POA: Diagnosis not present

## 2014-03-30 DIAGNOSIS — Z4789 Encounter for other orthopedic aftercare: Secondary | ICD-10-CM | POA: Diagnosis not present

## 2014-03-30 DIAGNOSIS — Z471 Aftercare following joint replacement surgery: Secondary | ICD-10-CM | POA: Diagnosis not present

## 2014-04-07 LAB — GLUCOSE, CAPILLARY: Glucose-Capillary: 141 mg/dL — ABNORMAL HIGH (ref 70–99)

## 2014-04-10 DIAGNOSIS — F329 Major depressive disorder, single episode, unspecified: Secondary | ICD-10-CM | POA: Diagnosis not present

## 2014-04-10 DIAGNOSIS — F3289 Other specified depressive episodes: Secondary | ICD-10-CM | POA: Diagnosis not present

## 2014-04-10 DIAGNOSIS — F0390 Unspecified dementia without behavioral disturbance: Secondary | ICD-10-CM | POA: Diagnosis not present

## 2014-04-21 ENCOUNTER — Encounter: Payer: Self-pay | Admitting: *Deleted

## 2014-04-25 DIAGNOSIS — Z4789 Encounter for other orthopedic aftercare: Secondary | ICD-10-CM | POA: Diagnosis not present

## 2014-04-25 DIAGNOSIS — F0390 Unspecified dementia without behavioral disturbance: Secondary | ICD-10-CM | POA: Diagnosis not present

## 2014-04-25 DIAGNOSIS — M810 Age-related osteoporosis without current pathological fracture: Secondary | ICD-10-CM | POA: Diagnosis not present

## 2014-04-27 DIAGNOSIS — Z471 Aftercare following joint replacement surgery: Secondary | ICD-10-CM | POA: Diagnosis not present

## 2014-04-27 DIAGNOSIS — M899 Disorder of bone, unspecified: Secondary | ICD-10-CM | POA: Diagnosis not present

## 2014-04-27 DIAGNOSIS — M949 Disorder of cartilage, unspecified: Secondary | ICD-10-CM | POA: Diagnosis not present

## 2014-04-27 DIAGNOSIS — Z96649 Presence of unspecified artificial hip joint: Secondary | ICD-10-CM | POA: Diagnosis not present

## 2014-05-05 DIAGNOSIS — R1310 Dysphagia, unspecified: Secondary | ICD-10-CM | POA: Diagnosis not present

## 2014-05-05 DIAGNOSIS — Z96641 Presence of right artificial hip joint: Secondary | ICD-10-CM | POA: Diagnosis not present

## 2014-05-05 DIAGNOSIS — S72001S Fracture of unspecified part of neck of right femur, sequela: Secondary | ICD-10-CM | POA: Diagnosis not present

## 2014-05-05 DIAGNOSIS — G309 Alzheimer's disease, unspecified: Secondary | ICD-10-CM | POA: Diagnosis not present

## 2014-05-05 DIAGNOSIS — M810 Age-related osteoporosis without current pathological fracture: Secondary | ICD-10-CM | POA: Diagnosis not present

## 2014-05-05 DIAGNOSIS — F0390 Unspecified dementia without behavioral disturbance: Secondary | ICD-10-CM | POA: Diagnosis not present

## 2014-05-05 DIAGNOSIS — F329 Major depressive disorder, single episode, unspecified: Secondary | ICD-10-CM | POA: Diagnosis not present

## 2014-05-05 DIAGNOSIS — Z471 Aftercare following joint replacement surgery: Secondary | ICD-10-CM | POA: Diagnosis not present

## 2014-05-05 DIAGNOSIS — R489 Unspecified symbolic dysfunctions: Secondary | ICD-10-CM | POA: Diagnosis not present

## 2014-05-05 DIAGNOSIS — S72001D Fracture of unspecified part of neck of right femur, subsequent encounter for closed fracture with routine healing: Secondary | ICD-10-CM | POA: Diagnosis not present

## 2014-05-05 DIAGNOSIS — S72001G Fracture of unspecified part of neck of right femur, subsequent encounter for closed fracture with delayed healing: Secondary | ICD-10-CM | POA: Diagnosis not present

## 2014-05-05 DIAGNOSIS — R269 Unspecified abnormalities of gait and mobility: Secondary | ICD-10-CM | POA: Diagnosis not present

## 2014-05-05 DIAGNOSIS — M129 Arthropathy, unspecified: Secondary | ICD-10-CM | POA: Diagnosis not present

## 2014-05-05 DIAGNOSIS — R279 Unspecified lack of coordination: Secondary | ICD-10-CM | POA: Diagnosis not present

## 2014-05-05 DIAGNOSIS — R262 Difficulty in walking, not elsewhere classified: Secondary | ICD-10-CM | POA: Diagnosis not present

## 2014-05-05 DIAGNOSIS — D62 Acute posthemorrhagic anemia: Secondary | ICD-10-CM | POA: Diagnosis not present

## 2014-05-05 DIAGNOSIS — D649 Anemia, unspecified: Secondary | ICD-10-CM | POA: Diagnosis not present

## 2014-05-05 DIAGNOSIS — E43 Unspecified severe protein-calorie malnutrition: Secondary | ICD-10-CM | POA: Diagnosis not present

## 2014-05-05 DIAGNOSIS — Z9181 History of falling: Secondary | ICD-10-CM | POA: Diagnosis not present

## 2014-05-05 DIAGNOSIS — M6281 Muscle weakness (generalized): Secondary | ICD-10-CM | POA: Diagnosis not present

## 2014-05-05 DIAGNOSIS — F339 Major depressive disorder, recurrent, unspecified: Secondary | ICD-10-CM | POA: Diagnosis not present

## 2014-05-15 DIAGNOSIS — F329 Major depressive disorder, single episode, unspecified: Secondary | ICD-10-CM | POA: Diagnosis not present

## 2014-05-15 DIAGNOSIS — F0390 Unspecified dementia without behavioral disturbance: Secondary | ICD-10-CM | POA: Diagnosis not present

## 2014-06-06 ENCOUNTER — Non-Acute Institutional Stay (SKILLED_NURSING_FACILITY): Payer: Medicare Other | Admitting: Internal Medicine

## 2014-06-06 ENCOUNTER — Encounter: Payer: Self-pay | Admitting: Internal Medicine

## 2014-06-06 DIAGNOSIS — D62 Acute posthemorrhagic anemia: Secondary | ICD-10-CM

## 2014-06-06 DIAGNOSIS — S72001D Fracture of unspecified part of neck of right femur, subsequent encounter for closed fracture with routine healing: Secondary | ICD-10-CM

## 2014-06-06 DIAGNOSIS — F0390 Unspecified dementia without behavioral disturbance: Secondary | ICD-10-CM | POA: Diagnosis not present

## 2014-06-06 NOTE — Progress Notes (Signed)
Patient ID: [name], female   DOB: Jun 08, 1925, 80 y.o.   MRN: [id number]   Facility: [facility] This is a routine visit.   Chief complaint; medical management of chronic issues including lright hip fracture with repair-anemia-dementia-osteoporosis  History; this is a patient that we admitted to this facility initially at the end of June. She suffered a fall at home in the bathroom and a rightt femoral neck fracture. She underwent a left hemiarthroplasty by Dr. Aline Brochure. It's postoperatively she suffered a fall with a periprosthetic fracture. Given the severity of her underlying dementia and attempt was made to treat this conservatively and she was put on bed rest. Unfortunately she was not able to maintain nonweightbearing restrictions and the fracture displaced. She was referred to [facility] for a total hip arthroplasty. She actually has made a good recovery has been cleared for weightbearing as tolerated per orthopedic consult proximally a month ago-she continues on Xarelto for DVT prophylaxis I suspect this can be changed to aspirin although will have staff check with orthopedics to make sure it is okay with them.  She also had postop anemia hemoglobin appears to be trending up most recent hemoglobin 8.5 we will need to recheck this she is on iron.  Her vital signs continued to be stable she has no complaints her main concern is she wants to get home.  She does continue on calcium with vitamin D with history of osteoporosis  Past Medical History  Diagnosis Date  . Dementia   . Osteoarthritis    Past Surgical History  Procedure Laterality Date  . Breast surgery    . Eye surgery    . Hip arthroplasty Right 01/01/2014    Procedure: BIPOLAR ARTHROPLASTY RIGHT HIP; Surgeon: Carole Civil, MD; Location: [facility]; Service: Orthopedics; Laterality: Right; Rt total Hip Revisiion 8/14   Current medications  Reviewed per [facility]  Social; The  patient was originally living in her home home before her original fracture and surgery at the end of June. According to family she was able to ambulate, feed herself, participate in ADLs. She was continent of bowel and bladder. She had fairly constant in home support reports that she has never smoked. She does not have any smokeless tobacco history on file. She reports that she does not drink alcohol or use illicit drugs.  Review of systems Somewhat limited secondary to dementia but apparently this has improved status post surgery she is more alert and interactive than when I saw her previously.  In general does not complain of fever or chills.  Respiratory no complaints shortness breath or cough.  Cardiac no chest pain no edema.  GI does not complain of any abdominal discomfort apparently appetite is improving no diarrhea constipation nausea or vomiting.  Muscle skeletal is not complaining of joint pain apparently, saw is effective for pain control.  Neurologic does not complain of dizziness or headache.  Psych does have dementia although she appears clearer  more interactive than when I saw her last time    Physical examination.  Ronayne temperature 97.6 pulse 86 respirations 20 blood pressure 102/46-120/59 in this range weight is 120.4 Gen. the patient looks comfortable sitting in her wheelchair she is bright and alert and interactive.  Her skin is warm and dry.  Oropharynx clear mucous membranes moist.  Chest is clear to auscultation no labored breathing.  Heart is regular rate and rhythm without murmur gallop or rub.--she does not have significant lower extremity edema pedal pulse appears  to be intact on the right  Abdomen soft nontender positive bowel sounds.  Muscle skeletal limited exam since patient is wheelchair but does move all extremities 4 did not note any deformity C does not appear to have any significant pain with movement at the hip.  Neurologic is grossly  intact her speech is clear.  Psych she is alert and oriented to self can carry on a fairly basic conversation this is an improvement from previous exam.   \labs.  I'll this 26 2015.  WBC 5.0 hemoglobin 8.5 platelets 335.  Sodium 140 potassium 4.2 BUN 12 creatinine 0.61.  .  Impression/plan #1 status post revision of her original right total hip arthroplasty [see discussion above]. The patient appears to have tolerated the procedure well. She is on xarelto for DVT prophylaxis.at this point consider changing to aspirin will obtain orthopedic input on this although she appears to be more ambulatory now which is encouraging #2 postoperative delirium; This appears resolved her mental status appears to be significantly better than previous exam #3 moderate to severe dementia preoperatively--she appears to have returned to her baseline status-she does have a sitter apparently this will be continued when she goes home I suspect her discharge will not be too far off    #5 probable osteoporosis; she is on vitamin D 3 calcium-. #  .

## 2014-06-08 DIAGNOSIS — Z471 Aftercare following joint replacement surgery: Secondary | ICD-10-CM | POA: Diagnosis not present

## 2014-06-08 DIAGNOSIS — Z96641 Presence of right artificial hip joint: Secondary | ICD-10-CM | POA: Diagnosis not present

## 2014-06-09 ENCOUNTER — Non-Acute Institutional Stay (SKILLED_NURSING_FACILITY): Payer: Medicare Other | Admitting: Internal Medicine

## 2014-06-09 ENCOUNTER — Encounter: Payer: Self-pay | Admitting: Internal Medicine

## 2014-06-09 DIAGNOSIS — F0390 Unspecified dementia without behavioral disturbance: Secondary | ICD-10-CM | POA: Diagnosis not present

## 2014-06-09 DIAGNOSIS — D62 Acute posthemorrhagic anemia: Secondary | ICD-10-CM

## 2014-06-09 DIAGNOSIS — S72001G Fracture of unspecified part of neck of right femur, subsequent encounter for closed fracture with delayed healing: Secondary | ICD-10-CM

## 2014-06-09 NOTE — Progress Notes (Signed)
Patient ID: [name], female   DOB: 1924-12-24, 80 y.o.   MRN: [id number]  this is a discharge note.  Level of care skilled.  Facility [facility]   Chief complain--tdischarge note     HPI-- this is a patient that we admitted to this facility initially at the end of June. She suffered a fall at home in the bathroom and a rightt femoral neck fracture. She underwent a left hemiarthroplasty by Dr. Aline Brochure. It's postoperatively she suffered a fall with a periprosthetic fracture. Given the severity of her underlying dementia and attempt was made to treat this conservatively and she was put on bed rest. Unfortunately she was not able to maintain nonweightbearing restrictions and the fracture displaced. She was referred to [facility] for a total hip arthroplasty. She actually has made a good recovery has been cleared for weightbearing as tolerated per orthopedic consult proximally a month ago-she continues on Xarelto for DVT prophylaxis I suspect this can be changed to aspirin --Orthopedics is apparently evaluating this.  She also had postop anemia hemoglobin appears to be trending up most recent hemoglobin 9.7  she is on iron.  Her vital signs continued to be stable she has no complaints her main concern is she wants to get home--and apparently this will happen within the next several days-she is made a good recovery here  .  She will have a full-time sitter with her.--still largely ambulates in a wheelchair apparently will get up and walk with a walker with therapy.  He will need a wheelchair for ambulation as well as a bedside commode secondary to some continued weakness with her extensive orthopedic history-also would benefit from continued PT and OT.      She does continue on calcium with vitamin D with history of osteoporosis  Past Medical History  Diagnosis Date  . Dementia   . Osteoarthritis    Past Surgical History  Procedure Laterality Date    . Breast surgery    . Eye surgery    . Hip arthroplasty Right 01/01/2014    Procedure: BIPOLAR ARTHROPLASTY RIGHT HIP; Surgeon: Carole Civil, MD; Location: [facility]; Service: Orthopedics; Laterality: Right; Rt total Hip Revisiion 8/14   Current medications  Reviewed per [facility]  Social; The patient was originally living in her home home before her original fracture and surgery at the end of June. According to family she was able to ambulate, feed herself, participate in ADLs. She was continent of bowel and bladder. She had fairly constant in home support reports that she has never smoked. She does not have any smokeless tobacco history on file. She reports that she does not drink alcohol or use illicit drugs.  Review of systems Somewhat limited secondary to dementia but apparently this has improved status post surgery she is more alert and interactive than when I saw her previously.  In general does not complain of fever or chills.  Respiratory no complaints shortness breath or cough.  Cardiac no chest pain no edema.  GI does not complain of any abdominal discomfort apparently appetite is improving no diarrhea constipation nausea or vomiting.  Muscle skeletal is not complaining of joint pain--does not take Ultram much.  Neurologic does not complain of dizziness or headache.  Psych does have dementia although she appears clearermore interactivepleasant and appropriate...    Physical examination.   Temperature 98.6 pulse 81 respirations 20 blood pressure 138/68--weight is stable at 120.4 Gen. the patient looks comfortable sitting in her wheelchair she is bright  and alert and interactive.  Her skin is warm and dry.  Oropharynx clear mucous membranes moist.  Chest is clear to auscultation no labored breathing.  Heart is regular rate and rhythm without murmur gallop or rub.--she does not have significant lower extremity edema pedal pulse  appears to be intact on the right  Abdomen soft nontender positive bowel sounds.  Muscle skeletal limited exam since patient is wheelchair but does move all extremities 4 did not note any deformity  does not appear to have any significant pain with movement at the hip.--surgical site appears well-healed on the right hip  Neurologic is grossly intact her speech is clear.  Psych she is alert and oriented to self--day and month not oriented to year or place can carry on a fairly basic conversation --again this is significantly improved from what I saw initially during her stay   \labs.  06/07/2014.  WBC 4.3 hemoglobin 9.7 platelets 261.  Sodium 139 potassium 4.3 BUN 20 creatinine 0.77.  Liver function tests within normal limits except albumin of 2.8  IAugust 26 2015.  WBC 5.0 hemoglobin 8.5 platelets 335.  Sodium 140 potassium 4.2 BUN 12 creatinine 0.61.  .  Impression/plan #1 status post revision of her original right total hip arthroplasty [see discussion above]. The patient appears to have tolerated the procedure well. She is on xarelto for DVT prophylaxis.at this point consider changing to aspirin but awaiting orthopedic input.  She will need PT and OT at home-also still will need a wheelchair for ambulation-also a bedside commode secondary to some continued weakness with her recent orthopedic issues  Again my understanding is she will have a full-time sitter in fact sitter is with her in the room  today as well  #2 postoperative delirium; This appears resolved her mental status appears to be significantly improved  #3 moderate to severe dementia preoperatively--she appears to have returned to her baseline status-she does have a sitter apparently this will be continued when she goes home     #5 probable osteoporosis; she is on vitamin D 3 calcium- . #6-anemia-thought to be postop-- Hgb risen she is on iron  Depression-this appears stable she is on  Celexa  [id number].

## 2014-06-14 DIAGNOSIS — F0390 Unspecified dementia without behavioral disturbance: Secondary | ICD-10-CM

## 2014-06-14 DIAGNOSIS — Z96641 Presence of right artificial hip joint: Secondary | ICD-10-CM | POA: Diagnosis not present

## 2014-06-14 DIAGNOSIS — Z471 Aftercare following joint replacement surgery: Secondary | ICD-10-CM

## 2014-06-14 DIAGNOSIS — M810 Age-related osteoporosis without current pathological fracture: Secondary | ICD-10-CM | POA: Diagnosis not present

## 2014-06-14 DIAGNOSIS — Z9181 History of falling: Secondary | ICD-10-CM | POA: Diagnosis not present

## 2014-06-14 DIAGNOSIS — D649 Anemia, unspecified: Secondary | ICD-10-CM | POA: Diagnosis not present

## 2014-06-14 DIAGNOSIS — M180 Bilateral primary osteoarthritis of first carpometacarpal joints: Secondary | ICD-10-CM

## 2014-06-14 DIAGNOSIS — F329 Major depressive disorder, single episode, unspecified: Secondary | ICD-10-CM | POA: Diagnosis not present

## 2014-06-14 DIAGNOSIS — M150 Primary generalized (osteo)arthritis: Secondary | ICD-10-CM | POA: Diagnosis not present

## 2014-06-15 DIAGNOSIS — M810 Age-related osteoporosis without current pathological fracture: Secondary | ICD-10-CM | POA: Diagnosis not present

## 2014-06-15 DIAGNOSIS — F0390 Unspecified dementia without behavioral disturbance: Secondary | ICD-10-CM | POA: Diagnosis not present

## 2014-06-15 DIAGNOSIS — Z471 Aftercare following joint replacement surgery: Secondary | ICD-10-CM | POA: Diagnosis not present

## 2014-06-15 DIAGNOSIS — M150 Primary generalized (osteo)arthritis: Secondary | ICD-10-CM | POA: Diagnosis not present

## 2014-06-15 DIAGNOSIS — D649 Anemia, unspecified: Secondary | ICD-10-CM | POA: Diagnosis not present

## 2014-06-15 DIAGNOSIS — F329 Major depressive disorder, single episode, unspecified: Secondary | ICD-10-CM | POA: Diagnosis not present

## 2014-06-16 DIAGNOSIS — M810 Age-related osteoporosis without current pathological fracture: Secondary | ICD-10-CM | POA: Diagnosis not present

## 2014-06-16 DIAGNOSIS — D649 Anemia, unspecified: Secondary | ICD-10-CM | POA: Diagnosis not present

## 2014-06-16 DIAGNOSIS — M150 Primary generalized (osteo)arthritis: Secondary | ICD-10-CM | POA: Diagnosis not present

## 2014-06-16 DIAGNOSIS — F329 Major depressive disorder, single episode, unspecified: Secondary | ICD-10-CM | POA: Diagnosis not present

## 2014-06-16 DIAGNOSIS — Z471 Aftercare following joint replacement surgery: Secondary | ICD-10-CM | POA: Diagnosis not present

## 2014-06-16 DIAGNOSIS — F0390 Unspecified dementia without behavioral disturbance: Secondary | ICD-10-CM | POA: Diagnosis not present

## 2014-06-19 DIAGNOSIS — M150 Primary generalized (osteo)arthritis: Secondary | ICD-10-CM | POA: Diagnosis not present

## 2014-06-19 DIAGNOSIS — Z471 Aftercare following joint replacement surgery: Secondary | ICD-10-CM | POA: Diagnosis not present

## 2014-06-19 DIAGNOSIS — F0390 Unspecified dementia without behavioral disturbance: Secondary | ICD-10-CM | POA: Diagnosis not present

## 2014-06-19 DIAGNOSIS — F329 Major depressive disorder, single episode, unspecified: Secondary | ICD-10-CM | POA: Diagnosis not present

## 2014-06-19 DIAGNOSIS — M810 Age-related osteoporosis without current pathological fracture: Secondary | ICD-10-CM | POA: Diagnosis not present

## 2014-06-19 DIAGNOSIS — D649 Anemia, unspecified: Secondary | ICD-10-CM | POA: Diagnosis not present

## 2014-06-20 DIAGNOSIS — Z681 Body mass index (BMI) 19 or less, adult: Secondary | ICD-10-CM | POA: Diagnosis not present

## 2014-06-20 DIAGNOSIS — S72001S Fracture of unspecified part of neck of right femur, sequela: Secondary | ICD-10-CM | POA: Diagnosis not present

## 2014-06-21 DIAGNOSIS — F329 Major depressive disorder, single episode, unspecified: Secondary | ICD-10-CM | POA: Diagnosis not present

## 2014-06-21 DIAGNOSIS — M810 Age-related osteoporosis without current pathological fracture: Secondary | ICD-10-CM | POA: Diagnosis not present

## 2014-06-21 DIAGNOSIS — M150 Primary generalized (osteo)arthritis: Secondary | ICD-10-CM | POA: Diagnosis not present

## 2014-06-21 DIAGNOSIS — F0390 Unspecified dementia without behavioral disturbance: Secondary | ICD-10-CM | POA: Diagnosis not present

## 2014-06-21 DIAGNOSIS — D649 Anemia, unspecified: Secondary | ICD-10-CM | POA: Diagnosis not present

## 2014-06-21 DIAGNOSIS — Z471 Aftercare following joint replacement surgery: Secondary | ICD-10-CM | POA: Diagnosis not present

## 2014-06-23 DIAGNOSIS — Z471 Aftercare following joint replacement surgery: Secondary | ICD-10-CM | POA: Diagnosis not present

## 2014-06-23 DIAGNOSIS — F329 Major depressive disorder, single episode, unspecified: Secondary | ICD-10-CM | POA: Diagnosis not present

## 2014-06-23 DIAGNOSIS — M150 Primary generalized (osteo)arthritis: Secondary | ICD-10-CM | POA: Diagnosis not present

## 2014-06-23 DIAGNOSIS — M810 Age-related osteoporosis without current pathological fracture: Secondary | ICD-10-CM | POA: Diagnosis not present

## 2014-06-23 DIAGNOSIS — D649 Anemia, unspecified: Secondary | ICD-10-CM | POA: Diagnosis not present

## 2014-06-23 DIAGNOSIS — F0390 Unspecified dementia without behavioral disturbance: Secondary | ICD-10-CM | POA: Diagnosis not present

## 2014-06-26 DIAGNOSIS — F0390 Unspecified dementia without behavioral disturbance: Secondary | ICD-10-CM | POA: Diagnosis not present

## 2014-06-26 DIAGNOSIS — M150 Primary generalized (osteo)arthritis: Secondary | ICD-10-CM | POA: Diagnosis not present

## 2014-06-26 DIAGNOSIS — Z471 Aftercare following joint replacement surgery: Secondary | ICD-10-CM | POA: Diagnosis not present

## 2014-06-26 DIAGNOSIS — M810 Age-related osteoporosis without current pathological fracture: Secondary | ICD-10-CM | POA: Diagnosis not present

## 2014-06-26 DIAGNOSIS — D649 Anemia, unspecified: Secondary | ICD-10-CM | POA: Diagnosis not present

## 2014-06-26 DIAGNOSIS — F329 Major depressive disorder, single episode, unspecified: Secondary | ICD-10-CM | POA: Diagnosis not present

## 2014-06-28 DIAGNOSIS — F329 Major depressive disorder, single episode, unspecified: Secondary | ICD-10-CM | POA: Diagnosis not present

## 2014-06-28 DIAGNOSIS — M150 Primary generalized (osteo)arthritis: Secondary | ICD-10-CM | POA: Diagnosis not present

## 2014-06-28 DIAGNOSIS — M810 Age-related osteoporosis without current pathological fracture: Secondary | ICD-10-CM | POA: Diagnosis not present

## 2014-06-28 DIAGNOSIS — F0390 Unspecified dementia without behavioral disturbance: Secondary | ICD-10-CM | POA: Diagnosis not present

## 2014-06-28 DIAGNOSIS — Z471 Aftercare following joint replacement surgery: Secondary | ICD-10-CM | POA: Diagnosis not present

## 2014-06-28 DIAGNOSIS — D649 Anemia, unspecified: Secondary | ICD-10-CM | POA: Diagnosis not present

## 2014-06-30 DIAGNOSIS — M810 Age-related osteoporosis without current pathological fracture: Secondary | ICD-10-CM | POA: Diagnosis not present

## 2014-06-30 DIAGNOSIS — D649 Anemia, unspecified: Secondary | ICD-10-CM | POA: Diagnosis not present

## 2014-06-30 DIAGNOSIS — M150 Primary generalized (osteo)arthritis: Secondary | ICD-10-CM | POA: Diagnosis not present

## 2014-06-30 DIAGNOSIS — F0390 Unspecified dementia without behavioral disturbance: Secondary | ICD-10-CM | POA: Diagnosis not present

## 2014-06-30 DIAGNOSIS — F329 Major depressive disorder, single episode, unspecified: Secondary | ICD-10-CM | POA: Diagnosis not present

## 2014-06-30 DIAGNOSIS — Z471 Aftercare following joint replacement surgery: Secondary | ICD-10-CM | POA: Diagnosis not present

## 2014-07-03 ENCOUNTER — Emergency Department (HOSPITAL_COMMUNITY)
Admission: EM | Admit: 2014-07-03 | Discharge: 2014-07-03 | Disposition: A | Discharge status: Home / Self Care | Payer: Medicare Other | Attending: Emergency Medicine | Admitting: Emergency Medicine

## 2014-07-03 ENCOUNTER — Encounter (HOSPITAL_COMMUNITY): Payer: Self-pay | Admitting: *Deleted

## 2014-07-03 ENCOUNTER — Emergency Department (HOSPITAL_COMMUNITY): Payer: Medicare Other

## 2014-07-03 DIAGNOSIS — M150 Primary generalized (osteo)arthritis: Secondary | ICD-10-CM | POA: Diagnosis not present

## 2014-07-03 DIAGNOSIS — Y9289 Other specified places as the place of occurrence of the external cause: Secondary | ICD-10-CM | POA: Insufficient documentation

## 2014-07-03 DIAGNOSIS — M25561 Pain in right knee: Secondary | ICD-10-CM | POA: Diagnosis not present

## 2014-07-03 DIAGNOSIS — F329 Major depressive disorder, single episode, unspecified: Secondary | ICD-10-CM | POA: Diagnosis not present

## 2014-07-03 DIAGNOSIS — M810 Age-related osteoporosis without current pathological fracture: Secondary | ICD-10-CM | POA: Diagnosis not present

## 2014-07-03 DIAGNOSIS — F0390 Unspecified dementia without behavioral disturbance: Secondary | ICD-10-CM | POA: Insufficient documentation

## 2014-07-03 DIAGNOSIS — Y998 Other external cause status: Secondary | ICD-10-CM | POA: Insufficient documentation

## 2014-07-03 DIAGNOSIS — Y9389 Activity, other specified: Secondary | ICD-10-CM | POA: Insufficient documentation

## 2014-07-03 DIAGNOSIS — M1990 Unspecified osteoarthritis, unspecified site: Secondary | ICD-10-CM | POA: Insufficient documentation

## 2014-07-03 DIAGNOSIS — D649 Anemia, unspecified: Secondary | ICD-10-CM | POA: Diagnosis not present

## 2014-07-03 DIAGNOSIS — W01198A Fall on same level from slipping, tripping and stumbling with subsequent striking against other object, initial encounter: Secondary | ICD-10-CM | POA: Insufficient documentation

## 2014-07-03 DIAGNOSIS — R52 Pain, unspecified: Secondary | ICD-10-CM

## 2014-07-03 DIAGNOSIS — Z79899 Other long term (current) drug therapy: Secondary | ICD-10-CM | POA: Diagnosis not present

## 2014-07-03 DIAGNOSIS — S8991XA Unspecified injury of right lower leg, initial encounter: Secondary | ICD-10-CM | POA: Diagnosis not present

## 2014-07-03 DIAGNOSIS — Z471 Aftercare following joint replacement surgery: Secondary | ICD-10-CM | POA: Diagnosis not present

## 2014-07-03 NOTE — ED Provider Notes (Signed)
CSN: [id number]     Arrival date & time 07/03/14  1520 History  This chart was scribed for Kem Parkinson, PA-C with Carmin Muskrat, MD by Edison Simon, ED Scribe. This patient was seen in room APFT22/APFT22 and the patient's care was started at 4:51 PM.    Chief Complaint  Patient presents with  . Fall   The history is provided by the patient and a relative. No language interpreter was used.    HPI Comments: [name] is a 80 y.o. female who presents to the Emergency Department complaining of pain to her right lower leg and knee status post falling 2 days ago, though her daughter notes she did not complain of pain until today. PAtient's daughter notes that was walking with her aide and slipped, she did not fall as her aide assisted to the floor.  She reports associated swelling. She denies radiation of pain. She states she has been ambulating and standing without difficulty. She states pain is worse when bending her knee. She used Xarelto until 1 week ago secondary to a hip replacement surgery.  Per daughter, she did not complain of symptoms until today. She has been doing physical therapy twice a week and other exercise as well. She denies history of blood clots, calf pain, numbness or weakness of the extremity  Past Medical History  Diagnosis Date  . Dementia   . Osteoarthritis    Past Surgical History  Procedure Laterality Date  . Breast surgery    . Eye surgery    . Hip arthroplasty Right 01/01/2014    Procedure: BIPOLAR ARTHROPLASTY RIGHT HIP;  Surgeon: Carole Civil, MD;  Location: [facility];  Service: Orthopedics;  Laterality: Right;   No family history on file. History  Substance Use Topics  . Smoking status: Never Smoker   . Smokeless tobacco: Not on file  . Alcohol Use: No     Comment: Patient drinks coffee daily.   OB History    No data available     Review of Systems  Constitutional: Negative for fever and chills.  Respiratory: Negative for cough and  shortness of breath.   Cardiovascular: Negative for chest pain.  Genitourinary: Negative for dysuria and difficulty urinating.  Musculoskeletal: Positive for arthralgias (right knee pain). Negative for joint swelling.  Skin: Negative for color change and wound.       Swelling  Neurological: Negative for dizziness, weakness and numbness.  Psychiatric/Behavioral: Negative for behavioral problems and agitation.  All other systems reviewed and are negative.     Allergies  Review of patient's allergies indicates no known allergies.  Home Medications   Prior to Admission medications   Medication Sig Start Date End Date Taking? Authorizing Provider  alum & mag hydroxide-simeth (MAALOX/MYLANTA) 200-200-20 MG/5ML suspension Take 30 mLs by mouth every 4 (four) hours as needed for indigestion. 01/06/14   Radene Gunning, NP  bisacodyl (DULCOLAX) 10 MG suppository Place 1 suppository (10 mg total) rectally daily as needed for moderate constipation. 01/06/14   Radene Gunning, NP  cholecalciferol (VITAMIN D) 1000 UNITS tablet Take 1,000 Units by mouth daily.    Historical Provider, MD  citalopram (CELEXA) 20 MG tablet Take 20 mg by mouth daily.    Historical Provider, MD  HYDROcodone-acetaminophen (NORCO/VICODIN) 5-325 MG per tablet Take one tablet by mouth twice daily as needed for pain 03/07/14   Blanchie Serve, MD  hydroxypropyl methylcellulose (ISOPTO TEARS) 2.5 % ophthalmic solution Place 1 drop into both eyes as needed  for dry eyes.    Historical Provider, MD  ibuprofen (ADVIL,MOTRIN) 200 MG tablet Take 200 mg by mouth every 6 (six) hours as needed for mild pain.    Historical Provider, MD  IRON PO Take 1 tablet by mouth daily.    Historical Provider, MD  Multiple Vitamins-Minerals (ICAPS AREDS FORMULA PO) Take 1 tablet by mouth daily.    Historical Provider, MD  Multiple Vitamins-Minerals (MULTIVITAMINS THER. W/MINERALS) TABS Take 1 tablet by mouth daily.    Historical Provider, MD  polyethylene glycol  (MIRALAX / GLYCOLAX) packet Take 17 g by mouth daily. Patient only puts a small amount in her coffee every morning.    Historical Provider, MD  rivaroxaban (XARELTO) 10 MG TABS tablet Take 10 mg by mouth daily.    Historical Provider, MD  SSD 1 % cream Apply 1 application topically daily. Apply to sore on right arm 12/21/13   Historical Provider, MD  traMADol (ULTRAM) 50 MG tablet Take 1 tablet (50 mg total) by mouth every 6 (six) hours. 01/09/14   Tiffany L Reed, DO   BP 149/56 mmHg  Pulse 82  Temp(Src) 98.6 F (37 C) (Oral)  Resp 18  Ht 5\' 2"  (1.575 m)  Wt 105 lb (47.628 kg)  BMI 19.20 kg/m2  SpO2 100% Physical Exam  Constitutional: She is oriented to person, place, and time. She appears well-developed and well-nourished. No distress.  HENT:  Head: Normocephalic and atraumatic.  Eyes: Conjunctivae are normal.  Neck: Normal range of motion. Neck supple.  Cardiovascular: Normal rate, regular rhythm, normal heart sounds and intact distal pulses.   No murmur heard. Pulmonary/Chest: Effort normal and breath sounds normal. No respiratory distress. She has no wheezes. She has no rales.  Musculoskeletal: Normal range of motion. She exhibits tenderness.  localized tenderness to the lateral right knee No erythema, crepitus or ecchymosis patient has full ROM  Distal sensation intact, DP pulse brisk Compartments of the right LE are soft.    Neurological: She is alert and oriented to person, place, and time. She exhibits normal muscle tone. Coordination normal.  Skin: Skin is warm and dry. No erythema.  Psychiatric: She has a normal mood and affect.  Nursing note and vitals reviewed.   ED Course  Procedures (including critical care time)  DIAGNOSTIC STUDIES: Oxygen Saturation is 100% on room air, normal by my interpretation.    COORDINATION OF CARE: 5:01 PM Discussed treatment plan with patient at beside, including x-ray. She declines pain medication. The patient agrees with the plan and  has no further questions at this time.  5:48 PM Dr. Vanita Panda also saw the patient and care plan was discussed.   Labs Review Labs Reviewed - No data to display  Imaging Review Dg Knee Complete 4 Views Right  07/03/2014   CLINICAL DATA:  Pain right leg.  Fall Saturday morning.  EXAM: RIGHT KNEE - COMPLETE 4+ VIEW  COMPARISON:  None.  FINDINGS: Small knee joint effusion. Peripheral vascular calcification. Diffuse osteopenia and degenerative change. Degenerative changes right knee. No acute bony abnormality.  IMPRESSION: 1. Small knee joint effusion. 2. Mild degenerative changes right knee. Diffuse osteopenia. No acute abnormality identified.   Electronically Signed   By: Marcello Moores  Register   On: 07/03/2014 17:29     EKG Interpretation None      MDM   Final diagnoses:  Pain  Knee pain, right    Pt with right knee pain as only complaint.  No concerning sx's for DVT or ligamentous  injury  Pt also seen by Dr. Vanita Panda and care plan discussed.  Daughter and patient agree to symptomatic tx with ice, rest and tylenol if needed for pain.  Daughter agrees to close f/u with PMD  I personally performed the services described in this documentation, which was scribed in my presence. The recorded information has been reviewed and is accurate.    Marshall Kampf L. Ammie Ferrier 07/05/14 1853  Carmin Muskrat, MD 07/05/14 2046

## 2014-07-03 NOTE — ED Notes (Signed)
Pt states her feet slid while walking and she and her friend fell. Pain to right leg. The fall occurred Saturday morning.

## 2014-07-03 NOTE — ED Notes (Signed)
Pain rt lower leg, upper area near lat knee.

## 2014-07-03 NOTE — Discharge Instructions (Signed)
Arthritis, Nonspecific °Arthritis is pain, redness, warmth, or puffiness (inflammation) of a joint. The joint may be stiff or hurt when you move it. One or more joints may be affected. There are many types of arthritis. Your doctor may not know what type you have right away. The most common cause of arthritis is wear and tear on the joint (osteoarthritis). °HOME CARE  °· Only take medicine as told by your doctor. °· Rest the joint as much as possible. °· Raise (elevate) your joint if it is puffy. °· Use crutches if the painful joint is in your leg. °· Drink enough fluids to keep your pee (urine) clear or pale yellow. °· Follow your doctor's diet instructions. °· Use cold packs for very bad joint pain for 10 to 15 minutes every hour. Ask your doctor if it is okay for you to use hot packs. °· Exercise as told by your doctor. °· Take a warm shower if you have stiffness in the morning. °· Move your sore joints throughout the day. °GET HELP RIGHT AWAY IF:  °· You have a fever. °· You have very bad joint pain, puffiness, or redness. °· You have many joints that are painful and puffy. °· You are not getting better with treatment. °· You have very bad back pain or leg weakness. °· You cannot control when you poop (bowel movement) or pee (urinate). °· You do not feel better in 24 hours or are getting worse. °· You are having side effects from your medicine. °MAKE SURE YOU:  °· Understand these instructions. °· Will watch your condition. °· Will get help right away if you are not doing well or get worse. °Document Released: 10/15/2009 Document Revised: 01/20/2012 Document Reviewed: 10/15/2009 °ExitCare® Patient Information ©2015 [employer]. This information is not intended to replace advice given to you by your health care provider. Make sure you discuss any questions you have with your health care provider. ° °Knee Pain °Knee pain can be a result of an injury or other medical conditions. Treatment will depend on the cause  of your pain. °HOME CARE °· Only take medicine as told by your doctor. °· Keep a healthy weight. Being overweight can make the knee hurt more. °· Stretch before exercising or playing sports. °· If there is constant knee pain, change the way you exercise. Ask your doctor for advice. °· Make sure shoes fit well. Choose the right shoe for the sport or activity. °· Protect your knees. Wear kneepads if needed. °· Rest when you are tired. °GET HELP RIGHT AWAY IF:  °· Your knee pain does not stop. °· Your knee pain does not get better. °· Your knee joint feels hot to the touch. °· You have a fever. °MAKE SURE YOU:  °· Understand these instructions. °· Will watch this condition. °· Will get help right away if you are not doing well or get worse. °Document Released: 10/17/2008 Document Revised: 10/13/2011 Document Reviewed: 10/17/2008 °ExitCare® Patient Information ©2015 [employer]. This information is not intended to replace advice given to you by your health care provider. Make sure you discuss any questions you have with your health care provider. ° °

## 2014-07-04 DIAGNOSIS — M810 Age-related osteoporosis without current pathological fracture: Secondary | ICD-10-CM | POA: Diagnosis not present

## 2014-07-04 DIAGNOSIS — F0390 Unspecified dementia without behavioral disturbance: Secondary | ICD-10-CM | POA: Diagnosis not present

## 2014-07-04 DIAGNOSIS — F329 Major depressive disorder, single episode, unspecified: Secondary | ICD-10-CM | POA: Diagnosis not present

## 2014-07-04 DIAGNOSIS — D649 Anemia, unspecified: Secondary | ICD-10-CM | POA: Diagnosis not present

## 2014-07-04 DIAGNOSIS — Z471 Aftercare following joint replacement surgery: Secondary | ICD-10-CM | POA: Diagnosis not present

## 2014-07-04 DIAGNOSIS — M150 Primary generalized (osteo)arthritis: Secondary | ICD-10-CM | POA: Diagnosis not present

## 2014-07-06 DIAGNOSIS — D649 Anemia, unspecified: Secondary | ICD-10-CM | POA: Diagnosis not present

## 2014-07-06 DIAGNOSIS — Z471 Aftercare following joint replacement surgery: Secondary | ICD-10-CM | POA: Diagnosis not present

## 2014-07-06 DIAGNOSIS — F329 Major depressive disorder, single episode, unspecified: Secondary | ICD-10-CM | POA: Diagnosis not present

## 2014-07-06 DIAGNOSIS — M810 Age-related osteoporosis without current pathological fracture: Secondary | ICD-10-CM | POA: Diagnosis not present

## 2014-07-06 DIAGNOSIS — M150 Primary generalized (osteo)arthritis: Secondary | ICD-10-CM | POA: Diagnosis not present

## 2014-07-06 DIAGNOSIS — F0390 Unspecified dementia without behavioral disturbance: Secondary | ICD-10-CM | POA: Diagnosis not present

## 2014-07-07 DIAGNOSIS — M150 Primary generalized (osteo)arthritis: Secondary | ICD-10-CM | POA: Diagnosis not present

## 2014-07-07 DIAGNOSIS — Z471 Aftercare following joint replacement surgery: Secondary | ICD-10-CM | POA: Diagnosis not present

## 2014-07-07 DIAGNOSIS — F0390 Unspecified dementia without behavioral disturbance: Secondary | ICD-10-CM | POA: Diagnosis not present

## 2014-07-07 DIAGNOSIS — M810 Age-related osteoporosis without current pathological fracture: Secondary | ICD-10-CM | POA: Diagnosis not present

## 2014-07-07 DIAGNOSIS — D649 Anemia, unspecified: Secondary | ICD-10-CM | POA: Diagnosis not present

## 2014-07-07 DIAGNOSIS — F329 Major depressive disorder, single episode, unspecified: Secondary | ICD-10-CM | POA: Diagnosis not present

## 2014-07-10 DIAGNOSIS — D649 Anemia, unspecified: Secondary | ICD-10-CM | POA: Diagnosis not present

## 2014-07-10 DIAGNOSIS — M150 Primary generalized (osteo)arthritis: Secondary | ICD-10-CM | POA: Diagnosis not present

## 2014-07-10 DIAGNOSIS — Z471 Aftercare following joint replacement surgery: Secondary | ICD-10-CM | POA: Diagnosis not present

## 2014-07-10 DIAGNOSIS — M810 Age-related osteoporosis without current pathological fracture: Secondary | ICD-10-CM | POA: Diagnosis not present

## 2014-07-10 DIAGNOSIS — F0390 Unspecified dementia without behavioral disturbance: Secondary | ICD-10-CM | POA: Diagnosis not present

## 2014-07-10 DIAGNOSIS — F329 Major depressive disorder, single episode, unspecified: Secondary | ICD-10-CM | POA: Diagnosis not present

## 2014-07-12 ENCOUNTER — Encounter (HOSPITAL_COMMUNITY): Payer: Self-pay

## 2014-07-12 ENCOUNTER — Emergency Department (HOSPITAL_COMMUNITY): Payer: Medicare Other

## 2014-07-12 ENCOUNTER — Emergency Department (HOSPITAL_COMMUNITY)
Admission: EM | Admit: 2014-07-12 | Discharge: 2014-07-12 | Disposition: A | Discharge status: Home / Self Care | Payer: Medicare Other | Attending: Emergency Medicine | Admitting: Emergency Medicine

## 2014-07-12 DIAGNOSIS — M1990 Unspecified osteoarthritis, unspecified site: Secondary | ICD-10-CM | POA: Insufficient documentation

## 2014-07-12 DIAGNOSIS — Y998 Other external cause status: Secondary | ICD-10-CM | POA: Diagnosis not present

## 2014-07-12 DIAGNOSIS — Z471 Aftercare following joint replacement surgery: Secondary | ICD-10-CM | POA: Diagnosis not present

## 2014-07-12 DIAGNOSIS — G8929 Other chronic pain: Secondary | ICD-10-CM | POA: Insufficient documentation

## 2014-07-12 DIAGNOSIS — M810 Age-related osteoporosis without current pathological fracture: Secondary | ICD-10-CM | POA: Diagnosis not present

## 2014-07-12 DIAGNOSIS — Z8781 Personal history of (healed) traumatic fracture: Secondary | ICD-10-CM | POA: Diagnosis not present

## 2014-07-12 DIAGNOSIS — F0390 Unspecified dementia without behavioral disturbance: Secondary | ICD-10-CM | POA: Insufficient documentation

## 2014-07-12 DIAGNOSIS — W01198A Fall on same level from slipping, tripping and stumbling with subsequent striking against other object, initial encounter: Secondary | ICD-10-CM | POA: Diagnosis not present

## 2014-07-12 DIAGNOSIS — M25551 Pain in right hip: Secondary | ICD-10-CM | POA: Diagnosis not present

## 2014-07-12 DIAGNOSIS — S72121D Displaced fracture of lesser trochanter of right femur, subsequent encounter for closed fracture with routine healing: Secondary | ICD-10-CM | POA: Diagnosis not present

## 2014-07-12 DIAGNOSIS — T148 Other injury of unspecified body region: Secondary | ICD-10-CM | POA: Diagnosis not present

## 2014-07-12 DIAGNOSIS — Z96641 Presence of right artificial hip joint: Secondary | ICD-10-CM | POA: Diagnosis not present

## 2014-07-12 DIAGNOSIS — Y92018 Other place in single-family (private) house as the place of occurrence of the external cause: Secondary | ICD-10-CM | POA: Diagnosis not present

## 2014-07-12 DIAGNOSIS — Y9389 Activity, other specified: Secondary | ICD-10-CM | POA: Diagnosis not present

## 2014-07-12 DIAGNOSIS — S60511A Abrasion of right hand, initial encounter: Secondary | ICD-10-CM | POA: Insufficient documentation

## 2014-07-12 DIAGNOSIS — S3992XA Unspecified injury of lower back, initial encounter: Secondary | ICD-10-CM | POA: Diagnosis not present

## 2014-07-12 DIAGNOSIS — Z79899 Other long term (current) drug therapy: Secondary | ICD-10-CM | POA: Insufficient documentation

## 2014-07-12 DIAGNOSIS — D649 Anemia, unspecified: Secondary | ICD-10-CM | POA: Diagnosis not present

## 2014-07-12 DIAGNOSIS — Z9181 History of falling: Secondary | ICD-10-CM | POA: Diagnosis not present

## 2014-07-12 DIAGNOSIS — S79911A Unspecified injury of right hip, initial encounter: Secondary | ICD-10-CM | POA: Diagnosis not present

## 2014-07-12 DIAGNOSIS — Z7901 Long term (current) use of anticoagulants: Secondary | ICD-10-CM | POA: Diagnosis not present

## 2014-07-12 DIAGNOSIS — W19XXXA Unspecified fall, initial encounter: Secondary | ICD-10-CM

## 2014-07-12 DIAGNOSIS — M150 Primary generalized (osteo)arthritis: Secondary | ICD-10-CM | POA: Diagnosis not present

## 2014-07-12 DIAGNOSIS — M8580 Other specified disorders of bone density and structure, unspecified site: Secondary | ICD-10-CM | POA: Diagnosis not present

## 2014-07-12 DIAGNOSIS — M545 Low back pain: Secondary | ICD-10-CM | POA: Diagnosis not present

## 2014-07-12 DIAGNOSIS — T84048D Periprosthetic fracture around other internal prosthetic joint, subsequent encounter: Secondary | ICD-10-CM | POA: Diagnosis not present

## 2014-07-12 DIAGNOSIS — F329 Major depressive disorder, single episode, unspecified: Secondary | ICD-10-CM | POA: Diagnosis not present

## 2014-07-12 MED ORDER — BACITRACIN-NEOMYCIN-POLYMYXIN 400-5-5000 EX OINT
TOPICAL_OINTMENT | Freq: Once | CUTANEOUS | Status: AC
  Administered 2014-07-12: 1 via TOPICAL
  Filled 2014-07-12: qty 1

## 2014-07-12 NOTE — ED Notes (Signed)
Slipped off bed onto floor. C/o low back pain

## 2014-07-12 NOTE — Discharge Instructions (Signed)
X-ray was normal. Follow-up your doctor.

## 2014-07-12 NOTE — ED Notes (Signed)
Undressed pt and noticed pt has skin tear on left upper arm.  Will do wound care and dress.

## 2014-07-12 NOTE — ED Provider Notes (Signed)
CSN: [id number]     Arrival date & time 07/12/14  4098 History    This chart was scribed for Lydia Christen, MD by [name], ED Scribe. This patient was seen in room APA18/APA18 and the patient's care was started at 7:22 AM.   Chief Complaint  Patient presents with  . Fall   LEVEL 5 CAVEAT DUE TO MILD DEMENTIA  The history is provided by the patient and a relative. The history is limited by the condition of the patient. No language interpreter was used.    HPI Comments: [name] is a 80 y.o. female who presents to the Emergency Department complaining of for a fall that occurred 1-2 hours ago when patient was getting ready to go to a doctor's appointment. She fell backwards and hit her head on a door casing, although she did not lose consciousness. Patient complains of mild pain in her lower back and mild lateral hip pain. Her niece notes patient has chronic lower back pain. Patient lives in a home, and her niece stays with her at night. Per niece, patient has had 2 total right hip replacements, with her last one done in August by Dr. Jefferson Fuel in [place].      Past Medical History  Diagnosis Date  . Dementia   . Osteoarthritis    Past Surgical History  Procedure Laterality Date  . Breast surgery    . Eye surgery    . Hip arthroplasty Right 01/01/2014    Procedure: BIPOLAR ARTHROPLASTY RIGHT HIP;  Surgeon: Carole Civil, MD;  Location: [facility];  Service: Orthopedics;  Laterality: Right;   No family history on file. History  Substance Use Topics  . Smoking status: Never Smoker   . Smokeless tobacco: Not on file  . Alcohol Use: No   OB History    No data available     Review of Systems  Musculoskeletal: Positive for back pain.  Skin: Positive for wound.  All other systems reviewed and are negative.   A complete 10 system review of systems was obtained and all systems are negative except as noted in the HPI and PMH.    Allergies  Review of patient's allergies  indicates no known allergies.  Home Medications   Prior to Admission medications   Medication Sig Start Date End Date Taking? Authorizing Provider  alum & mag hydroxide-simeth (MAALOX/MYLANTA) 200-200-20 MG/5ML suspension Take 30 mLs by mouth every 4 (four) hours as needed for indigestion. 01/06/14   Radene Gunning, NP  bisacodyl (DULCOLAX) 10 MG suppository Place 1 suppository (10 mg total) rectally daily as needed for moderate constipation. 01/06/14   Radene Gunning, NP  cholecalciferol (VITAMIN D) 1000 UNITS tablet Take 1,000 Units by mouth daily.    Historical Provider, MD  citalopram (CELEXA) 20 MG tablet Take 20 mg by mouth daily.    Historical Provider, MD  HYDROcodone-acetaminophen (NORCO/VICODIN) 5-325 MG per tablet Take one tablet by mouth twice daily as needed for pain Patient not taking: Reported on 07/03/2014 03/07/14   Blanchie Serve, MD  hydroxypropyl methylcellulose (ISOPTO TEARS) 2.5 % ophthalmic solution Place 1 drop into both eyes as needed for dry eyes.    Historical Provider, MD  IRON PO Take 1 tablet by mouth daily.    Historical Provider, MD  Multiple Vitamins-Minerals (ICAPS AREDS FORMULA PO) Take 1 tablet by mouth daily.    Historical Provider, MD  Multiple Vitamins-Minerals (MULTIVITAMINS THER. W/MINERALS) TABS Take 1 tablet by mouth daily.  Historical Provider, MD  polyethylene glycol (MIRALAX / GLYCOLAX) packet Take 17 g by mouth daily. Patient only puts a small amount in her coffee every morning.    Historical Provider, MD  rivaroxaban (XARELTO) 10 MG TABS tablet Take 10 mg by mouth daily.    Historical Provider, MD  traMADol (ULTRAM) 50 MG tablet Take 1 tablet (50 mg total) by mouth every 6 (six) hours. Patient not taking: Reported on 07/03/2014 01/09/14   Tiffany L Reed, DO   BP 179/78 mmHg  Pulse 94  Temp(Src) 98.1 F (36.7 C)  Resp 16  Ht 5\' 2"  (1.575 m)  Wt 105 lb (47.628 kg)  BMI 19.20 kg/m2  SpO2 95% Physical Exam  Constitutional: She is oriented to person,  place, and time. She appears well-developed and well-nourished.  HENT:  Head: Normocephalic and atraumatic.  Eyes: Conjunctivae and EOM are normal. Pupils are equal, round, and reactive to light.  Neck: Normal range of motion. Neck supple.  Cardiovascular: Normal rate, regular rhythm and normal heart sounds.   Pulmonary/Chest: Effort normal and breath sounds normal.  Abdominal: Soft. Bowel sounds are normal.  Musculoskeletal: Normal range of motion.  Minimal right lateral hip tenderness. Minimal lower back tenderness.  Neurological: She is alert and oriented to person, place, and time.  Skin: Skin is warm and dry.  Distal right tricep: 3 x 1.5 cm abrasion.  Psychiatric: She has a normal mood and affect. Her behavior is normal.  Nursing note and vitals reviewed.   ED Course  Procedures (including critical care time)  DIAGNOSTIC STUDIES: Oxygen Saturation is 95% on room air, adequate by my interpretation.    COORDINATION OF CARE: 7:26 AM - Discussed treatment plan with pt at bedside which includes an XR and wound dressing for her arm and pt agreed to plan.   Labs Review Labs Reviewed - No data to display  Imaging Review Dg Hip Complete Right  07/12/2014   CLINICAL DATA:  80 year old female status post fall off of bed onto floor with right hip pain. Initial encounter.  EXAM: RIGHT HIP - COMPLETE 2+ VIEW  COMPARISON:  02/20/2014 and earlier.  FINDINGS: Revision of the right hip arthroplasty since July. The new total arthroplasty hardware components appear intact and normally aligned. Healing fracture of the meta diaphysis at the lesser trochanter, with new bone formation and periosteal reaction since July. No other acute osseous finding in the visible proximal right femur.  Pelvis appears stable and intact. Proximal left femur appears stable. Dystrophic calcifications in the pelvis re - identified probably uterine fibroid related. Visualized bowel gas pattern is non obstructed. Calcified  atherosclerosis continuing into the right lower extremity.  IMPRESSION: 1. Since July revision of the right hip arthroplasty hardware and healing of the right femur fracture at the lesser trochanter. 2. No new fracture or dislocation identified about the right hip or pelvis.   Electronically Signed   By: Lars Pinks M.D.   On: 07/12/2014 08:17     EKG Interpretation None      MDM   Final diagnoses:  Right hip pain  Fall, initial encounter   family reports normal behavior. Pain films of right hip show no acute fracture. Patient will be discharged.  I personally performed the services described in this documentation, which was scribed in my presence. The recorded information has been reviewed and is accurate.   Lydia Christen, MD 07/12/14 [phone]

## 2014-07-12 NOTE — ED Notes (Signed)
Pt reports was with her niece this morning getting ready to go to a doctor's appt.  Reports pt had just stood up from using the bathroom and was standing with her walker.  Niece says she turned her back to move the bsc and heard pt fall.  Pt c/o pain in lower back.  Also pt says hit her head on door casing but did not lose consciousness.  Pt denies pain anywhere except for lower back.  Niece reports pt c/o lower back pain every morning.

## 2014-07-13 DIAGNOSIS — M150 Primary generalized (osteo)arthritis: Secondary | ICD-10-CM | POA: Diagnosis not present

## 2014-07-13 DIAGNOSIS — Z471 Aftercare following joint replacement surgery: Secondary | ICD-10-CM | POA: Diagnosis not present

## 2014-07-13 DIAGNOSIS — D649 Anemia, unspecified: Secondary | ICD-10-CM | POA: Diagnosis not present

## 2014-07-13 DIAGNOSIS — F329 Major depressive disorder, single episode, unspecified: Secondary | ICD-10-CM | POA: Diagnosis not present

## 2014-07-13 DIAGNOSIS — M810 Age-related osteoporosis without current pathological fracture: Secondary | ICD-10-CM | POA: Diagnosis not present

## 2014-07-13 DIAGNOSIS — F0390 Unspecified dementia without behavioral disturbance: Secondary | ICD-10-CM | POA: Diagnosis not present

## 2014-07-14 DIAGNOSIS — F0390 Unspecified dementia without behavioral disturbance: Secondary | ICD-10-CM | POA: Diagnosis not present

## 2014-07-14 DIAGNOSIS — Z471 Aftercare following joint replacement surgery: Secondary | ICD-10-CM | POA: Diagnosis not present

## 2014-07-14 DIAGNOSIS — M150 Primary generalized (osteo)arthritis: Secondary | ICD-10-CM | POA: Diagnosis not present

## 2014-07-14 DIAGNOSIS — M810 Age-related osteoporosis without current pathological fracture: Secondary | ICD-10-CM | POA: Diagnosis not present

## 2014-07-14 DIAGNOSIS — F329 Major depressive disorder, single episode, unspecified: Secondary | ICD-10-CM | POA: Diagnosis not present

## 2014-07-14 DIAGNOSIS — D649 Anemia, unspecified: Secondary | ICD-10-CM | POA: Diagnosis not present

## 2014-07-24 DIAGNOSIS — F419 Anxiety disorder, unspecified: Secondary | ICD-10-CM | POA: Diagnosis not present

## 2014-07-24 DIAGNOSIS — M810 Age-related osteoporosis without current pathological fracture: Secondary | ICD-10-CM | POA: Diagnosis not present

## 2014-07-24 DIAGNOSIS — M1991 Primary osteoarthritis, unspecified site: Secondary | ICD-10-CM | POA: Diagnosis not present

## 2014-07-24 DIAGNOSIS — Z6820 Body mass index (BMI) 20.0-20.9, adult: Secondary | ICD-10-CM | POA: Diagnosis not present

## 2014-07-27 DIAGNOSIS — Z471 Aftercare following joint replacement surgery: Secondary | ICD-10-CM | POA: Diagnosis not present

## 2014-07-27 DIAGNOSIS — Z96641 Presence of right artificial hip joint: Secondary | ICD-10-CM | POA: Diagnosis not present

## 2014-07-27 DIAGNOSIS — Z9660 Presence of unspecified orthopedic joint implant: Secondary | ICD-10-CM | POA: Diagnosis not present

## 2014-08-07 DIAGNOSIS — Z96641 Presence of right artificial hip joint: Secondary | ICD-10-CM | POA: Diagnosis not present

## 2014-08-07 DIAGNOSIS — F0390 Unspecified dementia without behavioral disturbance: Secondary | ICD-10-CM | POA: Diagnosis not present

## 2014-08-07 DIAGNOSIS — Z471 Aftercare following joint replacement surgery: Secondary | ICD-10-CM | POA: Diagnosis not present

## 2014-08-14 DIAGNOSIS — D225 Melanocytic nevi of trunk: Secondary | ICD-10-CM | POA: Diagnosis not present

## 2014-08-14 DIAGNOSIS — L821 Other seborrheic keratosis: Secondary | ICD-10-CM | POA: Diagnosis not present

## 2014-08-29 DIAGNOSIS — N342 Other urethritis: Secondary | ICD-10-CM | POA: Diagnosis not present

## 2014-08-29 DIAGNOSIS — M810 Age-related osteoporosis without current pathological fracture: Secondary | ICD-10-CM | POA: Diagnosis not present

## 2014-08-29 DIAGNOSIS — M1991 Primary osteoarthritis, unspecified site: Secondary | ICD-10-CM | POA: Diagnosis not present

## 2014-08-29 DIAGNOSIS — Z6820 Body mass index (BMI) 20.0-20.9, adult: Secondary | ICD-10-CM | POA: Diagnosis not present

## 2014-09-22 DIAGNOSIS — M25551 Pain in right hip: Secondary | ICD-10-CM | POA: Diagnosis not present

## 2014-09-28 ENCOUNTER — Ambulatory Visit (INDEPENDENT_AMBULATORY_CARE_PROVIDER_SITE_OTHER): Payer: Medicare Other | Admitting: Diagnostic Neuroimaging

## 2014-09-28 ENCOUNTER — Encounter: Payer: Self-pay | Admitting: Diagnostic Neuroimaging

## 2014-09-28 VITALS — BP 120/76 | HR 69 | Ht 63.0 in

## 2014-09-28 DIAGNOSIS — F0390 Unspecified dementia without behavioral disturbance: Secondary | ICD-10-CM | POA: Diagnosis not present

## 2014-09-28 DIAGNOSIS — F03C0 Unspecified dementia, severe, without behavioral disturbance, psychotic disturbance, mood disturbance, and anxiety: Secondary | ICD-10-CM

## 2014-09-28 NOTE — Progress Notes (Signed)
[facility]  PATIENT: [name] DOB: Aug 02, 1925  REFERRING CLINICIAN: Hilma Favors HISTORY FROM: patient and niece and sister REASON FOR VISIT: follow up   HISTORICAL  CHIEF COMPLAINT:  Chief Complaint  Patient presents with  . Follow-up    dementia     HISTORY OF PRESENT ILLNESS:   UPDATE 09/28/14: Since last visit, has had progressive decline mentally and physically. Has had multiple falls, even after having 24 hour caregivers. Has had 2 right hip fractures, with 2 right hip surgeries. Apparently there is ongoing litigation about a long term care insurance policy that is supposed to pay out to help her but had not been successfully claimed.  PRIOR HPI (11/29/12): 81 year old right-handed female here for evaluation of memory loss. Patient is accompanied by her niece for this visit. I asked the patient why she is here for this visit, and she proceeded to tell me a tangential story about assigning beneficiaries to her life insurance plan. Patient became very emotional, telling me that she had designated 6 of her nieces as the beneficiaries, but later felt guilty about not including her other 12 or so nieces and nephews and other relatives. Patient's niece tells me that she's had at least one to 2 year history of progressive short-term memory loss. Patient is now forgetting names of close family members, friends, forgetting conversations and tasks. Patient continues to live alone in her own home. She still drives. She usually wakes up in the morning, gets ready, makes a bowl of cereal and takes care of small chores around the home. She has someone come to clean her house once a month. She has somewhat take care of regard were. Patient's grandniece Sales promotion account executive) is her healthcare power of attorney. Unfortunately [name] is not here for this visit. Patient was tried on donepezil for possible dementia, but had to stop this because this made her "nervous and anxious". In  general patient has been more emotional lately. She's having more episodes of depression and anxiety.   REVIEW OF SYSTEMS: Full 14 system review of systems performed and notable only for memory loss hearing loss loss of vision snoring sleep talking confusion depression anxiety easy bruising.    ALLERGIES: No Known Allergies  HOME MEDICATIONS: Outpatient Prescriptions Prior to Visit  Medication Sig Dispense Refill  . cholecalciferol (VITAMIN D) 1000 UNITS tablet Take 1,000 Units by mouth daily.    . IRON PO Take 1 tablet by mouth daily.    . Multiple Vitamins-Minerals (MULTIVITAMINS THER. W/MINERALS) TABS Take 1 tablet by mouth daily.    . polyethylene glycol (MIRALAX / GLYCOLAX) packet Take 17 g by mouth daily. Patient only puts a small amount in her coffee every morning.    Marland Kitchen alum & mag hydroxide-simeth (MAALOX/MYLANTA) 200-200-20 MG/5ML suspension Take 30 mLs by mouth every 4 (four) hours as needed for indigestion. (Patient not taking: Reported on 09/28/2014) 355 mL 0  . bisacodyl (DULCOLAX) 10 MG suppository Place 1 suppository (10 mg total) rectally daily as needed for moderate constipation. 12 suppository 0  . citalopram (CELEXA) 20 MG tablet Take 20 mg by mouth daily.    Marland Kitchen HYDROcodone-acetaminophen (NORCO/VICODIN) 5-325 MG per tablet Take one tablet by mouth twice daily as needed for pain (Patient not taking: Reported on 07/03/2014) 60 tablet 0  . hydroxypropyl methylcellulose (ISOPTO TEARS) 2.5 % ophthalmic solution Place 1 drop into both eyes as needed for dry eyes.    . Multiple Vitamins-Minerals (ICAPS AREDS FORMULA PO) Take 1 tablet  by mouth daily.    . rivaroxaban (XARELTO) 10 MG TABS tablet Take 10 mg by mouth daily.    . traMADol (ULTRAM) 50 MG tablet Take 1 tablet (50 mg total) by mouth every 6 (six) hours. (Patient not taking: Reported on 07/03/2014) 120 tablet 5   No facility-administered medications prior to visit.    PAST MEDICAL HISTORY: Past Medical History    Diagnosis Date  . Dementia   . Osteoarthritis     PAST SURGICAL HISTORY: Past Surgical History  Procedure Laterality Date  . Breast surgery    . Eye surgery    . Hip arthroplasty Right 01/01/2014    Procedure: BIPOLAR ARTHROPLASTY RIGHT HIP;  Surgeon: Carole Civil, MD;  Location: [facility];  Service: Orthopedics;  Laterality: Right;    FAMILY HISTORY: History reviewed. No pertinent family history.  SOCIAL HISTORY:  History   Social History  . Marital Status: Widowed    Spouse Name: N/A  . Number of Children: 0  . Years of Education: 7th   Occupational History  .     Social History Main Topics  . Smoking status: Never Smoker   . Smokeless tobacco: Not on file  . Alcohol Use: No  . Drug Use: No  . Sexual Activity: No   Other Topics Concern  . Not on file   Social History Narrative     PHYSICAL EXAM  Filed Vitals:   09/28/14 1523  BP: 120/76  Pulse: 69  Height: 5\' 3"  (1.6 m)   Body mass index is 0.00 kg/(m^2).  MMSE - Mini Mental State Exam 09/28/2014  Orientation to time 0  Orientation to Place 1  Registration 0  Attention/ Calculation 0  Recall 0  Language- name 2 objects 2  Language- repeat 0  Language- follow 3 step command 0  Language- read & follow direction 0  Write a sentence 0  Copy design 0  Total score 3    GENERAL EXAM: Patient is in no distress  CARDIOVASCULAR: Regular rate and rhythm, no murmurs, no carotid bruits  NEUROLOGIC: MENTAL STATUS: awake, alert; PAUCITY OF LANGUAGE. FOLLOWS SIMPLE COMMANDS. MMSE 3/30. POSITIVE MYERSONS. POSITIVE PALMOMENTAL. POSITIVE GRASP REFLEX.  CRANIAL NERVE: pupils equal and reactive to light, visual fields full to confrontation, extraocular muscles intact, no nystagmus, facial sensation and strength symmetric, uvula midline, shoulder shrug symmetric, tongue midline. MOTOR: normal bulk; PARATONIA, SYMM strength in the BUE, BLE 4/5 SENSORY: normal and symmetric to light touch COORDINATION:  finger-nose-finger, fine finger movements normal REFLEXES: BUE 2, BLE 1 GAIT/STATION: narrow based gait; SLOW AND CAUTIOUS. Romberg is negative    DIAGNOSTIC DATA (LABS, IMAGING, TESTING) - I reviewed patient records, labs, notes, testing and imaging myself where available.  Lab Results  Component Value Date   WBC 7.0 01/06/2014   HGB 10.2* 01/06/2014   HCT 29.9* 01/06/2014   MCV 92.3 01/06/2014   PLT 146* 01/06/2014      Component Value Date/Time   NA 141 01/05/2014 0521   K 4.1 01/05/2014 0521   CL 105 01/05/2014 0521   CO2 24 01/05/2014 0521   GLUCOSE 94 01/05/2014 0521   BUN 16 01/05/2014 0521   CREATININE 0.58 01/05/2014 0521   CALCIUM 8.3* 01/05/2014 0521   GFRNONAA 81* 01/05/2014 0521   GFRAA >90 01/05/2014 0521   No results found for: CHOL No results found for: HGBA1C No results found for: VITAMINB12 Lab Results  Component Value Date   TSH 2.770 11/29/2012   I reviewed images  myself and agree with interpretation. -[facility]  07/28/12 CT head - mild atrophy; mild chronic small vessel ischemic disease    ASSESSMENT AND PLAN  81 y.o. year old female  has a past medical history of Dementia and Osteoarthritis. here with progressive short-term memory loss, consistent with severe dementia. Patient requires 24 hour care and help with all of her ADLs. She cannot live alone.   PLAN: - palliative care consult (to review goals of care, MOST form, symptom mgmt, family discussion, end of life planning)  Return if symptoms worsen or fail to improve, for return to PCP.     Penni Bombard, MD 09/17/863, 7:84 PM Certified in Neurology, Neurophysiology and Neuroimaging  [facility] [address] [place], [place] [address] [phone]

## 2014-10-03 IMAGING — CR DG HIP COMPLETE 2+V*R*
3 series · 3 of 3 positions shown · non-contrast
Comparison: 02/06/2014

CLINICAL DATA: Right hip pain.

EXAM:
RIGHT HIP - COMPLETE 2+ VIEW

[view not recorded (1 of 3)]
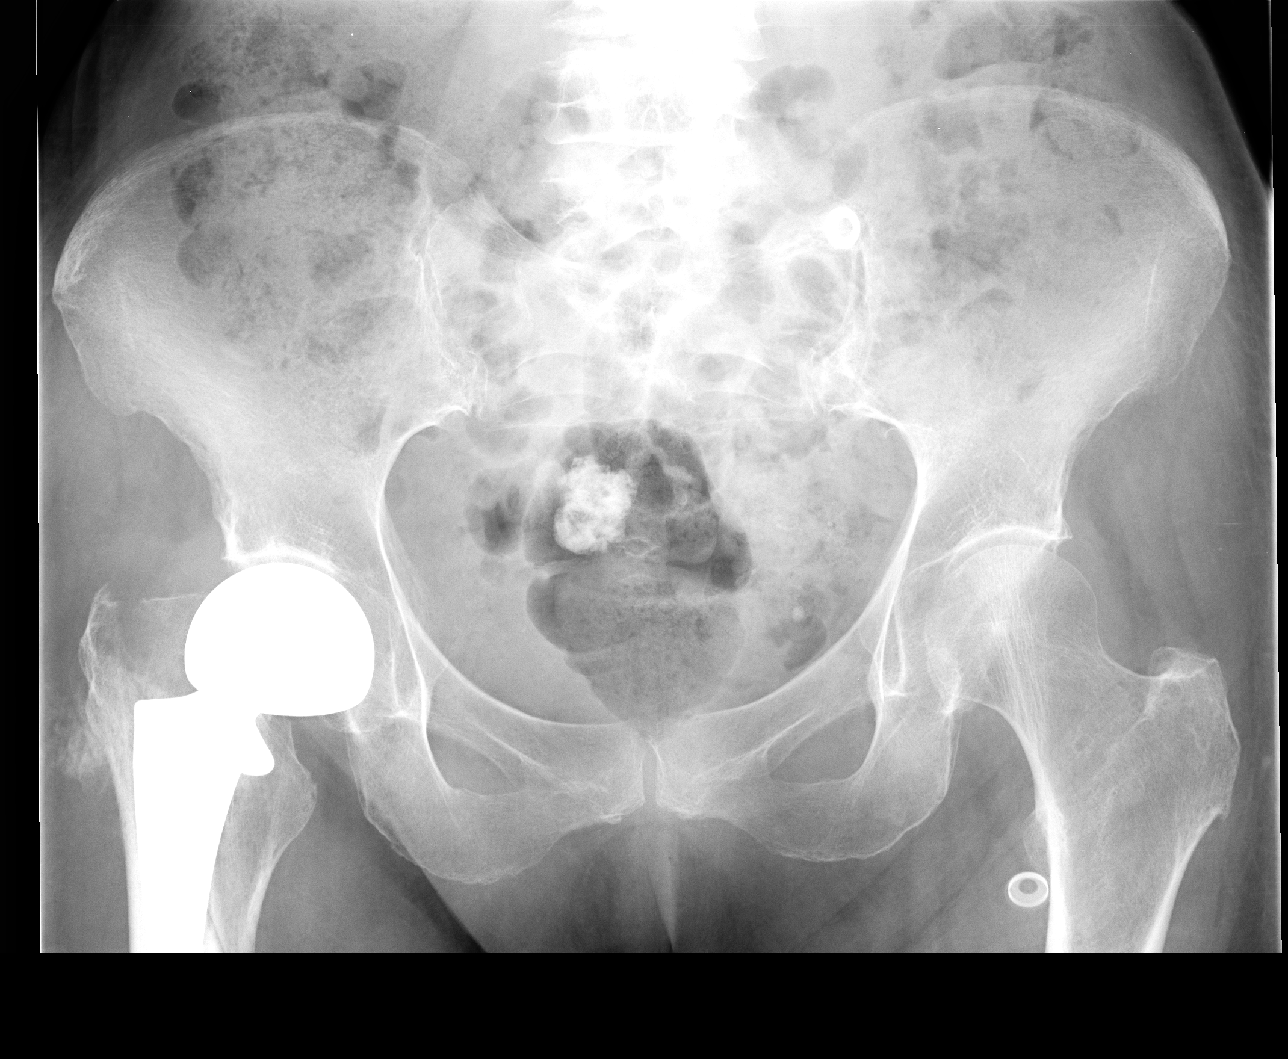

[view not recorded (2 of 3)]
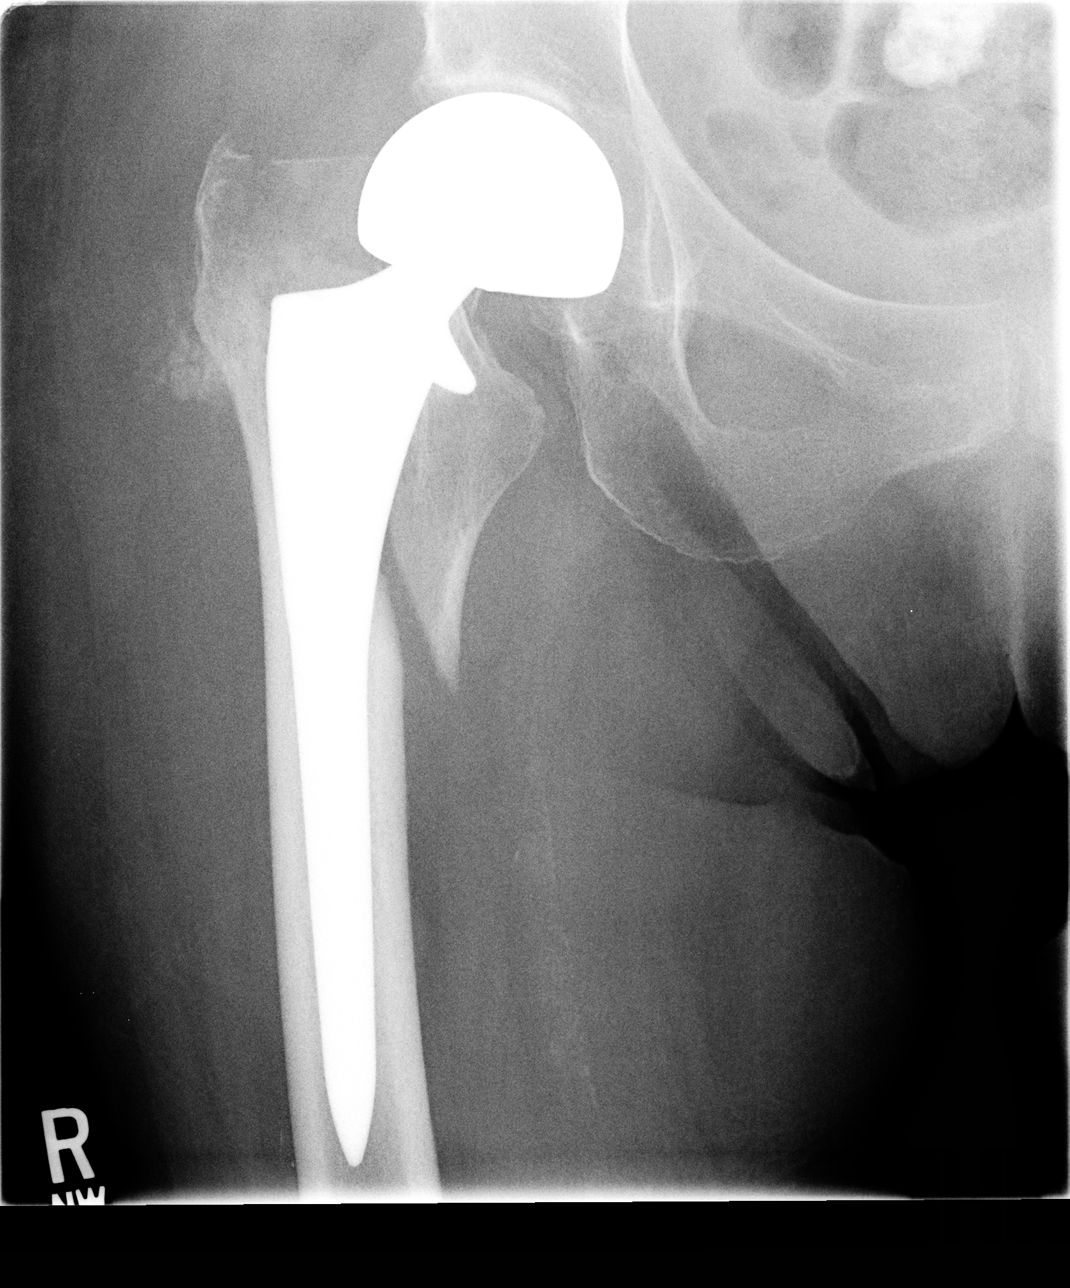

[view not recorded (3 of 3)]
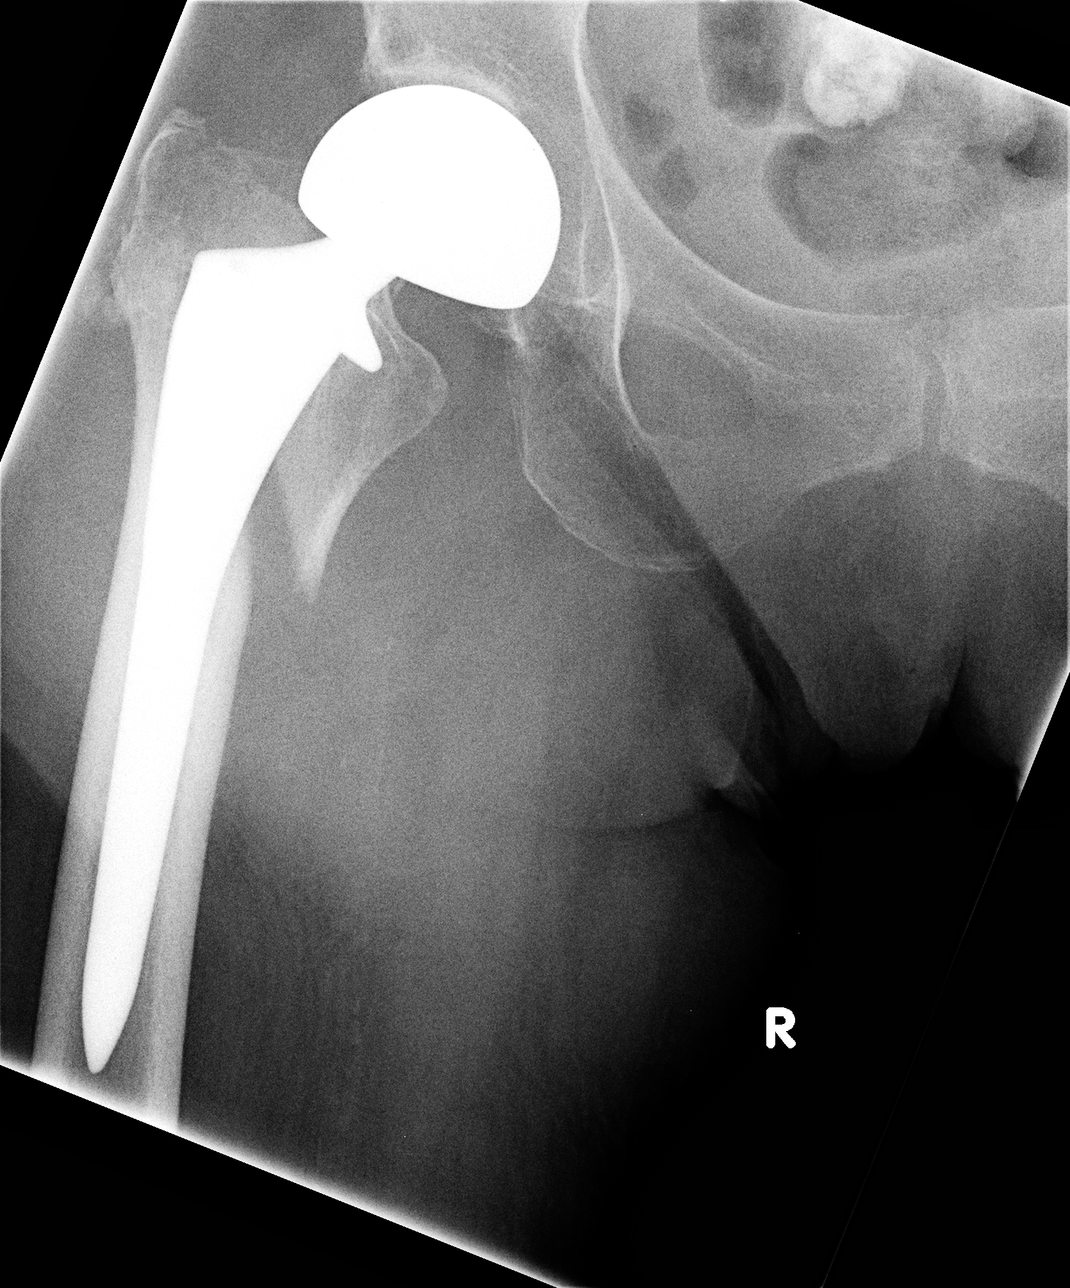

[3 of 3 positions shown; findings below may reference images not displayed]

FINDINGS: Sequelae right hip arthroplasty with right femoral prosthesis are
again identified. Periprosthetic right femur fracture involving the
intertrochanteric region is again seen. Approximately 8 mm medial
displacement of the lesser trochanter fragment has not significantly
changed. Mild callus formation inferior to the greater trochanter
has mildly progressed from the prior study. The prosthetic femoral
component remains approximated with the native acetabulum. Coarse
calcification in the pelvis is unchanged compatible with a uterine
fibroid.
IMPRESSION: Unchanged position and alignment of periprosthetic right femur
fracture. Mild progressive callus formation.

## 2014-10-06 DIAGNOSIS — N342 Other urethritis: Secondary | ICD-10-CM | POA: Diagnosis not present

## 2014-10-06 DIAGNOSIS — R3919 Other difficulties with micturition: Secondary | ICD-10-CM | POA: Diagnosis not present

## 2014-10-06 DIAGNOSIS — Z681 Body mass index (BMI) 19 or less, adult: Secondary | ICD-10-CM | POA: Diagnosis not present

## 2014-11-28 ENCOUNTER — Telehealth: Payer: Self-pay | Admitting: Diagnostic Neuroimaging

## 2014-11-28 NOTE — Telephone Encounter (Signed)
Spoke with Marzetta Board on the phone with whom I clarified that the order was for Palliative care and explained that the pt lives in the home with 24 hour care givers. She stated that the NP who would be doing in home palliative visits would not start working for them until 12/25/14 and stated that if anything was to change with the family to call her back and let her know. I told her I would call the family and inform them.

## 2014-11-28 NOTE — Telephone Encounter (Signed)
Stacey with [facility] is calling regarding orders as to whether patient requires full Hospice or Palative Care.  Please call.

## 2014-11-28 NOTE — Telephone Encounter (Signed)
Spoke with Remo Lipps the in home care giver and asked her to inform the POA about the fact that the NP would not start in home palliative care visits until after the 23 of May. She told me that she would give her the message

## 2014-12-08 DIAGNOSIS — N342 Other urethritis: Secondary | ICD-10-CM | POA: Diagnosis not present

## 2014-12-14 ENCOUNTER — Telehealth: Payer: Self-pay | Admitting: Diagnostic Neuroimaging

## 2014-12-14 NOTE — Telephone Encounter (Signed)
POA Ilsa Iha called and said Dr Leta Baptist has referred pt to Palliative Care and they have been calling pts home number. POA [name] wants to know the name of the Palliative Care and the phone number so she can call them to schedule dg

## 2014-12-15 NOTE — Telephone Encounter (Signed)
Called and left a message for the POA explaining that [place] the referral coordinator was out of the office until Monday and that I would ask her the name of the Palliative care company and the telephone number and get back with her.  I told her to call with anything else

## 2014-12-18 NOTE — Telephone Encounter (Signed)
Spoke with Markham Jordan the referral coordinator and was able to get the organization name and telephone number for the POA.  The organization is [facility] and the number is [phone]. I let [name] know this in the voicemail and informed her that the referral was resubmitted today and asked her to call back with any further questions.

## 2015-01-17 DIAGNOSIS — F0390 Unspecified dementia without behavioral disturbance: Secondary | ICD-10-CM | POA: Diagnosis not present

## 2015-02-23 ENCOUNTER — Inpatient Hospital Stay (HOSPITAL_COMMUNITY)
Admission: EM | Admit: 2015-02-23 | Discharge: 2015-02-26 | DRG: 536 | Disposition: A | Discharge status: Skilled Nursing Facility | Payer: Medicare Other | Attending: Internal Medicine | Admitting: Internal Medicine

## 2015-02-23 ENCOUNTER — Emergency Department (HOSPITAL_COMMUNITY): Payer: Medicare Other

## 2015-02-23 ENCOUNTER — Encounter (HOSPITAL_COMMUNITY): Payer: Self-pay | Admitting: Emergency Medicine

## 2015-02-23 DIAGNOSIS — Z7401 Bed confinement status: Secondary | ICD-10-CM | POA: Diagnosis not present

## 2015-02-23 DIAGNOSIS — Y92019 Unspecified place in single-family (private) house as the place of occurrence of the external cause: Secondary | ICD-10-CM

## 2015-02-23 DIAGNOSIS — S72012A Unspecified intracapsular fracture of left femur, initial encounter for closed fracture: Principal | ICD-10-CM

## 2015-02-23 DIAGNOSIS — Z515 Encounter for palliative care: Secondary | ICD-10-CM

## 2015-02-23 DIAGNOSIS — Z885 Allergy status to narcotic agent status: Secondary | ICD-10-CM

## 2015-02-23 DIAGNOSIS — S299XXA Unspecified injury of thorax, initial encounter: Secondary | ICD-10-CM | POA: Diagnosis not present

## 2015-02-23 DIAGNOSIS — R52 Pain, unspecified: Secondary | ICD-10-CM | POA: Diagnosis not present

## 2015-02-23 DIAGNOSIS — S72002A Fracture of unspecified part of neck of left femur, initial encounter for closed fracture: Secondary | ICD-10-CM | POA: Diagnosis present

## 2015-02-23 DIAGNOSIS — D259 Leiomyoma of uterus, unspecified: Secondary | ICD-10-CM | POA: Diagnosis present

## 2015-02-23 DIAGNOSIS — M25552 Pain in left hip: Secondary | ICD-10-CM | POA: Diagnosis not present

## 2015-02-23 DIAGNOSIS — F0390 Unspecified dementia without behavioral disturbance: Secondary | ICD-10-CM | POA: Diagnosis present

## 2015-02-23 DIAGNOSIS — W050XXA Fall from non-moving wheelchair, initial encounter: Secondary | ICD-10-CM | POA: Diagnosis present

## 2015-02-23 DIAGNOSIS — S8992XA Unspecified injury of left lower leg, initial encounter: Secondary | ICD-10-CM | POA: Diagnosis not present

## 2015-02-23 DIAGNOSIS — F0391 Unspecified dementia with behavioral disturbance: Secondary | ICD-10-CM | POA: Diagnosis not present

## 2015-02-23 DIAGNOSIS — I10 Essential (primary) hypertension: Secondary | ICD-10-CM | POA: Diagnosis not present

## 2015-02-23 DIAGNOSIS — Z96641 Presence of right artificial hip joint: Secondary | ICD-10-CM | POA: Diagnosis present

## 2015-02-23 DIAGNOSIS — Z66 Do not resuscitate: Secondary | ICD-10-CM | POA: Diagnosis present

## 2015-02-23 DIAGNOSIS — S72009A Fracture of unspecified part of neck of unspecified femur, initial encounter for closed fracture: Secondary | ICD-10-CM

## 2015-02-23 DIAGNOSIS — M21252 Flexion deformity, left hip: Secondary | ICD-10-CM | POA: Diagnosis not present

## 2015-02-23 DIAGNOSIS — Z01811 Encounter for preprocedural respiratory examination: Secondary | ICD-10-CM

## 2015-02-23 DIAGNOSIS — Z01818 Encounter for other preprocedural examination: Secondary | ICD-10-CM | POA: Diagnosis not present

## 2015-02-23 LAB — COMPREHENSIVE METABOLIC PANEL
ALK PHOS: 51 U/L (ref 38–126)
ALT: 18 U/L (ref 14–54)
AST: 25 U/L (ref 15–41)
Albumin: 3.4 g/dL — ABNORMAL LOW (ref 3.5–5.0)
Anion gap: 8 (ref 5–15)
BILIRUBIN TOTAL: 0.3 mg/dL (ref 0.3–1.2)
BUN: 15 mg/dL (ref 6–20)
CALCIUM: 9 mg/dL (ref 8.9–10.3)
CHLORIDE: 106 mmol/L (ref 101–111)
CO2: 27 mmol/L (ref 22–32)
Creatinine, Ser: 0.8 mg/dL (ref 0.44–1.00)
GFR calc Af Amer: >60 mL/min (ref >60)
GFR calc non Af Amer: >60 mL/min (ref >60)
GLUCOSE: 132 mg/dL — AB (ref 65–99)
POTASSIUM: 4 mmol/L (ref 3.5–5.1)
Sodium: 141 mmol/L (ref 135–145)
Total Protein: 6.6 g/dL (ref 6.5–8.1)

## 2015-02-23 LAB — URINALYSIS, ROUTINE W REFLEX MICROSCOPIC
Bilirubin Urine: NEGATIVE
GLUCOSE, UA: NEGATIVE mg/dL
Hgb urine dipstick: NEGATIVE
KETONES UR: NEGATIVE mg/dL
Leukocytes, UA: NEGATIVE
Nitrite: NEGATIVE
PH: 7.5 (ref 5.0–8.0)
Protein, ur: NEGATIVE mg/dL
Specific Gravity, Urine: 1.023 (ref 1.005–1.030)
Urobilinogen, UA: 1 mg/dL (ref 0.0–1.0)

## 2015-02-23 LAB — CBC WITH DIFFERENTIAL/PLATELET
BASOS ABS: 0 10*3/uL (ref 0.0–0.1)
BASOS PCT: 0 % (ref 0–1)
EOS ABS: 0 10*3/uL (ref 0.0–0.7)
Eosinophils Relative: 1 % (ref 0–5)
HEMATOCRIT: 35.3 % — AB (ref 36.0–46.0)
HEMOGLOBIN: 11.4 g/dL — AB (ref 12.0–15.0)
Lymphocytes Relative: 22 % (ref 12–46)
Lymphs Abs: 1 10*3/uL (ref 0.7–4.0)
MCH: 31.6 pg (ref 26.0–34.0)
MCHC: 32.3 g/dL (ref 30.0–36.0)
MCV: 97.8 fL (ref 78.0–100.0)
MONO ABS: 0.8 10*3/uL (ref 0.1–1.0)
Monocytes Relative: 17 % — ABNORMAL HIGH (ref 3–12)
Neutro Abs: 2.7 10*3/uL (ref 1.7–7.7)
Neutrophils Relative %: 60 % (ref 43–77)
Platelets: 222 10*3/uL (ref 150–400)
RBC: 3.61 MIL/uL — AB (ref 3.87–5.11)
RDW: 12.8 % (ref 11.5–15.5)
WBC: 4.5 10*3/uL (ref 4.0–10.5)

## 2015-02-23 LAB — ABO/RH: ABO/RH(D): A POS

## 2015-02-23 LAB — TYPE AND SCREEN
ABO/RH(D): A POS
ANTIBODY SCREEN: NEGATIVE

## 2015-02-23 LAB — APTT: aPTT: 32 seconds (ref 24–37)

## 2015-02-23 LAB — PROTIME-INR
INR: 0.98 (ref 0.00–1.49)
Prothrombin Time: 13.2 seconds (ref 11.6–15.2)

## 2015-02-23 MED ORDER — CITALOPRAM HYDROBROMIDE 20 MG PO TABS
20.0000 mg | ORAL_TABLET | Freq: Every day | ORAL | Status: DC
  Administered 2015-02-24 – 2015-02-26 (×3): 20 mg via ORAL
  Filled 2015-02-23 (×3): qty 1

## 2015-02-23 MED ORDER — FENTANYL CITRATE (PF) 100 MCG/2ML IJ SOLN
25.0000 ug | Freq: Once | INTRAMUSCULAR | Status: AC
  Administered 2015-02-23: 25 ug via INTRAVENOUS
  Filled 2015-02-23: qty 2

## 2015-02-23 MED ORDER — ASPIRIN EC 325 MG PO TBEC
325.0000 mg | DELAYED_RELEASE_TABLET | Freq: Every day | ORAL | Status: DC
  Administered 2015-02-24 – 2015-02-26 (×3): 325 mg via ORAL
  Filled 2015-02-23 (×4): qty 1

## 2015-02-23 MED ORDER — POLYETHYLENE GLYCOL 3350 17 G PO PACK: 7.0000 g | PACK | Freq: Every day | ORAL | Status: DC

## 2015-02-23 MED ORDER — LORAZEPAM 0.5 MG PO TABS: 0.2500 mg | ORAL_TABLET | Freq: Every day | ORAL | Status: DC | PRN

## 2015-02-23 MED ORDER — HYDROCODONE-ACETAMINOPHEN 5-325 MG PO TABS
1.0000 | ORAL_TABLET | Freq: Four times a day (QID) | ORAL | Status: DC | PRN
  Administered 2015-02-23: 1 via ORAL
  Administered 2015-02-24: 2 via ORAL
  Administered 2015-02-24: 1 via ORAL
  Filled 2015-02-23: qty 2
  Filled 2015-02-23: qty 1
  Filled 2015-02-23: qty 2

## 2015-02-23 MED ORDER — MORPHINE SULFATE 2 MG/ML IJ SOLN: 0.5000 mg | INTRAMUSCULAR | Status: DC | PRN

## 2015-02-23 NOTE — ED Notes (Signed)
Attempted to call report to 5N. 

## 2015-02-23 NOTE — ED Provider Notes (Signed)
CSN: [id number]     Arrival date & time 02/23/15  1535 History   First MD Initiated Contact with Patient 02/23/15 1544     Chief Complaint  Patient presents with  . Hip Pain  . Fall    Level V caveat secondary to dementia (Consider location/radiation/quality/duration/timing/severity/associated sxs/prior Treatment) HPI  81 year old female with dementia who fell today when attempting to stand. She had left hip pain and deformity after the fall. This occurred in her own home. Home health aide was present.  Past Medical History  Diagnosis Date  . Dementia   . Osteoarthritis    Past Surgical History  Procedure Laterality Date  . Breast surgery    . Eye surgery    . Hip arthroplasty Right 01/01/2014    Procedure: BIPOLAR ARTHROPLASTY RIGHT HIP;  Surgeon: Carole Civil, MD;  Location: [facility];  Service: Orthopedics;  Laterality: Right;   No family history on file. History  Substance Use Topics  . Smoking status: Never Smoker   . Smokeless tobacco: Not on file  . Alcohol Use: No   OB History    No data available     Review of Systems  Unable to perform ROS     Allergies  Review of patient's allergies indicates no known allergies.  Home Medications   Prior to Admission medications   Medication Sig Start Date End Date Taking? Authorizing Provider  acetaminophen (TYLENOL) 500 MG tablet Take 500-1,000 mg by mouth every 6 (six) hours as needed for moderate pain or headache.   Yes Historical Provider, MD  alum & mag hydroxide-simeth (MAALOX/MYLANTA) 200-200-20 MG/5ML suspension Take 30 mLs by mouth every 4 (four) hours as needed for indigestion. 01/06/14  Yes Radene Gunning, NP  cholecalciferol (VITAMIN D) 1000 UNITS tablet Take 1,000 Units by mouth daily.   Yes Historical Provider, MD  citalopram (CELEXA) 20 MG tablet Take 1 tablet by mouth daily. 01/26/15  Yes Historical Provider, MD  IRON PO Take 1 tablet by mouth daily.   Yes Historical Provider, MD  LORazepam (ATIVAN) 0.5  MG tablet Take 0.25-0.5 mg by mouth daily as needed for anxiety.  07/24/14  Yes Historical Provider, MD  Multiple Vitamins-Minerals (MULTIVITAMINS THER. W/MINERALS) TABS Take 1 tablet by mouth daily.   Yes Historical Provider, MD  polyethylene glycol (MIRALAX / GLYCOLAX) packet Take 7 g by mouth daily. Patient gets about a half-cap= 7-8g in her coffee every morning.   Yes Historical Provider, MD   BP 183/81 mmHg  Pulse 96  Temp(Src) 98.2 F (36.8 C) (Oral)  Resp 16  SpO2 99% Physical Exam  Constitutional: She appears well-developed and well-nourished.  HENT:  Head: Normocephalic and atraumatic.  Eyes: Conjunctivae and EOM are normal. Pupils are equal, round, and reactive to light.  Neck: Normal range of motion. Neck supple.  Cardiovascular: Normal rate and regular rhythm.   Pulmonary/Chest: Effort normal and breath sounds normal.  Abdominal: Soft. Bowel sounds are normal.  Musculoskeletal:  Left leg is shortened and rotated with tenderness to palpation of hip. Knee and foot appear normal. Dorsal pedalis pulses are intact bilaterally. There are skin tears noted of the left hand.  Neurological: She is alert.  Skin: Skin is warm and dry.  Psychiatric: She has a normal mood and affect.  Nursing note and vitals reviewed.   ED Course  Procedures (including critical care time) Labs Review Labs Reviewed  COMPREHENSIVE METABOLIC PANEL - Abnormal; Notable for the following:    Glucose, Bld 132 (*)  Albumin 3.4 (*)    All other components within normal limits  CBC WITH DIFFERENTIAL/PLATELET - Abnormal; Notable for the following:    RBC 3.61 (*)    Hemoglobin 11.4 (*)    HCT 35.3 (*)    Monocytes Relative 17 (*)    All other components within normal limits  URINE CULTURE  APTT  PROTIME-INR  URINALYSIS, ROUTINE W REFLEX MICROSCOPIC (NOT AT [facility])  TYPE AND SCREEN  ABO/RH    Imaging Review Dg Chest 1 View  02/23/2015   CLINICAL DATA:  Fall, left hip trauma  EXAM: CHEST  1 VIEW   COMPARISON:  01/29/2014  FINDINGS: Mild prominence of the right paratracheal stripe is new since prior exams. Patient is rotated to the right. This could be in part projectional but could also indicate a vascular abnormality. Moderate enlargement of the cardiac silhouette is noted. Diffusely prominent interstitial reticular opacities are noted. No new focal opacity. Possible L1 compression deformity, incompletely evaluated at today's exam.  IMPRESSION: Possible widening of the right paratracheal stripe versus projectional artifact.  Possible L1 compression deformity but also possibly projectional on this frontal [facility] view only.  PA and lateral chest radiographs are recommended for further evaluation of the above described findings.   Electronically Signed   By: Conchita Paris M.D.   On: 02/23/2015 17:25   Dg Hip Unilat With Pelvis 2-3 Views Left  02/23/2015   CLINICAL DATA:  Status post fall today. Left hip pain. Initial encounter.  EXAM: DG HIP (WITH OR WITHOUT PELVIS) 2-3V LEFT  COMPARISON:  None.  FINDINGS: The patient has an acute subcapital left hip fracture. No other acute abnormality is identified. The patient is status post right hip replacement. Calcified uterine fibroid is noted.  IMPRESSION: Acute subcapital left hip fracture.   Electronically Signed   By: Inge Rise M.D.   On: 02/23/2015 17:22     EKG Interpretation None      MDM   Final diagnoses:  Hip fracture  Pre-op chest exam    Discussed with Dr. Mardelle Matte and Dr. Alcario Drought.  Patient to be transferred to [facility] for possible surgical intervention.    Pattricia Boss, MD 02/23/15 [phone]

## 2015-02-23 NOTE — ED Notes (Signed)
Report given to [facility]. 

## 2015-02-23 NOTE — Progress Notes (Signed)
CM reviewed in details medicare guidelines, home health [facility]) (length of stay in home, types of [facility] staff available, coverage, primary caregiver, up to 24 hrs before services may be started) and Private duty nursing (PDN-coverage, length of stay in the home types of staff available). CM reviewed availability of [facility] to assist pcp to get pt to snf (if desired disposition) from the community level. CM provided pt/niece with a list of [place] home health agencies and PDN.  Pt for transfer to [facility]

## 2015-02-23 NOTE — ED Notes (Signed)
81 yo with fall. Fell on left side while ambulating with home health. Per [facility] shortening noted (home health states that's normal).

## 2015-02-23 NOTE — ED Notes (Signed)
[facility] at bedside 

## 2015-02-23 NOTE — ED Notes (Signed)
Attempted Report to unit. Charge RN to call back.

## 2015-02-23 NOTE — ED Notes (Signed)
Bed: OF18 Expected date:  Expected time:  Means of arrival:  Comments: EMS- 81yo F, possible hip fracture

## 2015-02-23 NOTE — Progress Notes (Signed)
Called regarding left hip fracture. Patient is nonambulatory at baseline, and has advanced dementia although apparently lives at home alone.  Full consult to follow, will plan to transfer to [facility] for operative intervention if elected by the family and by the patient.  We'll plan to see later on tonight. Surgery probably tomorrow if elected by the family. Okay to eat tonight. Nothing by mouth after midnight.  Johnny Bridge, MD

## 2015-02-23 NOTE — ED Notes (Signed)
Reported attempted. 

## 2015-02-23 NOTE — ED Notes (Signed)
MD at bedside. 

## 2015-02-23 NOTE — ED Notes (Addendum)
This RN called [facility] they give an estimated ETA of 1 hr

## 2015-02-23 NOTE — H&P (Signed)
[facility] History and Physical  [name]:[id number] DOB: 11-19-1924 DOA: 02/23/2015  Referring physician: EDP PCP: Purvis Kilts, MD   Chief Complaint: Fall, Hip pain   HPI: [name] is a 81 y.o. female with dementia, does not walk at baseline following a R hip fracture in May of last year.  Has 24/7 caregiver at home.  Today she apparently decided to try and stand up from her wheelchair at home (cannot do this at baseline) unassisted, which resulted in a mechanical fall.  Left hip pain following fall.  Thankfully has 24/7 care at home so EMS called right away.  Review of Systems: Systems reviewed.  As above, otherwise negative  Past Medical History  Diagnosis Date  . Dementia   . Osteoarthritis    Past Surgical History  Procedure Laterality Date  . Breast surgery    . Eye surgery    . Hip arthroplasty Right 01/01/2014    Procedure: BIPOLAR ARTHROPLASTY RIGHT HIP;  Surgeon: Carole Civil, MD;  Location: [facility];  Service: Orthopedics;  Laterality: Right;   Social History:  reports that she has never smoked. She does not have any smokeless tobacco history on file. She reports that she does not drink alcohol or use illicit drugs.  No Known Allergies  No family history on file.   Prior to Admission medications   Medication Sig Start Date End Date Taking? Authorizing Provider  acetaminophen (TYLENOL) 500 MG tablet Take 500-1,000 mg by mouth every 6 (six) hours as needed for moderate pain or headache.   Yes Historical Provider, MD  alum & mag hydroxide-simeth (MAALOX/MYLANTA) 200-200-20 MG/5ML suspension Take 30 mLs by mouth every 4 (four) hours as needed for indigestion. 01/06/14  Yes Radene Gunning, NP  cholecalciferol (VITAMIN D) 1000 UNITS tablet Take 1,000 Units by mouth daily.   Yes Historical Provider, MD  citalopram (CELEXA) 20 MG tablet Take 1 tablet by mouth daily. 01/26/15  Yes Historical Provider, MD  IRON PO Take 1 tablet by mouth daily.    Yes Historical Provider, MD  LORazepam (ATIVAN) 0.5 MG tablet Take 0.25-0.5 mg by mouth daily as needed for anxiety.  07/24/14  Yes Historical Provider, MD  Multiple Vitamins-Minerals (MULTIVITAMINS THER. W/MINERALS) TABS Take 1 tablet by mouth daily.   Yes Historical Provider, MD  polyethylene glycol (MIRALAX / GLYCOLAX) packet Take 7 g by mouth daily. Patient gets about a half-cap= 7-8g in her coffee every morning.   Yes Historical Provider, MD   Physical Exam: Filed Vitals:   02/23/15 1559  BP: 183/81  Pulse: 96  Temp: 98.2 F (36.8 C)  Resp: 16    BP 183/81 mmHg  Pulse 96  Temp(Src) 98.2 F (36.8 C) (Oral)  Resp 16  SpO2 99%  General Appearance:    Alert, oriented, no distress, appears stated age  Head:    Normocephalic, atraumatic  Eyes:    PERRL, EOMI, sclera non-icteric        Nose:   Nares without drainage or epistaxis. Mucosa, turbinates normal  Throat:   Moist mucous membranes. Oropharynx without erythema or exudate.  Neck:   Supple. No carotid bruits.  No thyromegaly.  No lymphadenopathy.   Back:     No CVA tenderness, no spinal tenderness  Lungs:     Clear to auscultation bilaterally, without wheezes, rhonchi or rales  Chest wall:    No tenderness to palpitation  Heart:    Regular rate and rhythm without murmurs, gallops, rubs  Abdomen:     Soft, non-tender, nondistended, normal bowel sounds, no organomegaly  Genitalia:    deferred  Rectal:    deferred  Extremities:   No clubbing, cyanosis or edema.  Pulses:   2+ and symmetric all extremities  Skin:   Skin color, texture, turgor normal, no rashes or lesions  Lymph nodes:   Cervical, supraclavicular, and axillary nodes normal  Neurologic:   CNII-XII intact. Normal strength, sensation and reflexes      throughout    Labs on Admission:  Basic Metabolic Panel:  Recent Labs Lab 02/23/15 1621  NA 141  K 4.0  CL 106  CO2 27  GLUCOSE 132*  BUN 15  CREATININE 0.80  CALCIUM 9.0   Liver Function  Tests:  Recent Labs Lab 02/23/15 1621  AST 25  ALT 18  ALKPHOS 51  BILITOT 0.3  PROT 6.6  ALBUMIN 3.4*   No results for input(s): LIPASE, AMYLASE in the last 168 hours. No results for input(s): AMMONIA in the last 168 hours. CBC:  Recent Labs Lab 02/23/15 1621  WBC 4.5  NEUTROABS 2.7  HGB 11.4*  HCT 35.3*  MCV 97.8  PLT 222   Cardiac Enzymes: No results for input(s): CKTOTAL, CKMB, CKMBINDEX, TROPONINI in the last 168 hours.  BNP (last 3 results) No results for input(s): PROBNP in the last 8760 hours. CBG: No results for input(s): GLUCAP in the last 168 hours.  Radiological Exams on Admission: Dg Chest 1 View  02/23/2015   CLINICAL DATA:  Fall, left hip trauma  EXAM: CHEST  1 VIEW  COMPARISON:  01/29/2014  FINDINGS: Mild prominence of the right paratracheal stripe is new since prior exams. Patient is rotated to the right. This could be in part projectional but could also indicate a vascular abnormality. Moderate enlargement of the cardiac silhouette is noted. Diffusely prominent interstitial reticular opacities are noted. No new focal opacity. Possible L1 compression deformity, incompletely evaluated at today's exam.  IMPRESSION: Possible widening of the right paratracheal stripe versus projectional artifact.  Possible L1 compression deformity but also possibly projectional on this frontal AP view only.  PA and lateral chest radiographs are recommended for further evaluation of the above described findings.   Electronically Signed   By: Conchita Paris M.D.   On: 02/23/2015 17:25   Dg Hip Unilat With Pelvis 2-3 Views Left  02/23/2015   CLINICAL DATA:  Status post fall today. Left hip pain. Initial encounter.  EXAM: DG HIP (WITH OR WITHOUT PELVIS) 2-3V LEFT  COMPARISON:  None.  FINDINGS: The patient has an acute subcapital left hip fracture. No other acute abnormality is identified. The patient is status post right hip replacement. Calcified uterine fibroid is noted.   IMPRESSION: Acute subcapital left hip fracture.   Electronically Signed   By: Inge Rise M.D.   On: 02/23/2015 17:22    EKG: Independently reviewed.  Assessment/Plan Principal Problem:   Closed displaced fracture of left femoral neck Active Problems:   Dementia   HTN (hypertension)   Subcapital fracture of left hip   1. Left subcapital fracture, closed, displaced - Dr. Mardelle Matte left note 1. Hip fracture pathway 2. Ortho to see tonight after transfer to [facility] per their note 3. Probable surgery tomorrow 4. Family at bedside, if no family there, please call niece, phone number listed in epic. 5. May eat till midnight, NPO after midnight. 2. Dementia - continue home meds 3. HTN - continue home meds.    Code Status: Full  Family Communication: Family at bedside, see above Disposition Plan: Ad[facility]   Time spent: 56 min  GARDNER, JARED M. [facility] Pager [phone]  If 7AM-7PM, please contact the day team taking care of the patient [email or web address] Password [facility] 02/23/2015, 6:38 PM

## 2015-02-24 ENCOUNTER — Encounter (HOSPITAL_COMMUNITY)
Admission: EM | Disposition: A | Discharge status: Skilled Nursing Facility | Payer: Self-pay | Source: Home / Self Care | Attending: Internal Medicine

## 2015-02-24 DIAGNOSIS — F0390 Unspecified dementia without behavioral disturbance: Secondary | ICD-10-CM

## 2015-02-24 DIAGNOSIS — Z515 Encounter for palliative care: Secondary | ICD-10-CM | POA: Insufficient documentation

## 2015-02-24 LAB — MRSA PCR SCREENING: MRSA by PCR: NEGATIVE

## 2015-02-24 SURGERY — HEMIARTHROPLASTY, HIP, DIRECT ANTERIOR APPROACH, FOR FRACTURE
Anesthesia: General | Laterality: Left

## 2015-02-24 MED ORDER — SENNOSIDES-DOCUSATE SODIUM 8.6-50 MG PO TABS
1.0000 | ORAL_TABLET | Freq: Every day | ORAL | Status: DC
  Administered 2015-02-24: 1 via ORAL
  Filled 2015-02-24 (×2): qty 1

## 2015-02-24 MED ORDER — ACETAMINOPHEN 500 MG PO TABS
1000.0000 mg | ORAL_TABLET | Freq: Once | ORAL | Status: AC
  Administered 2015-02-24: 1000 mg via ORAL
  Filled 2015-02-24: qty 2

## 2015-02-24 MED ORDER — ENSURE ENLIVE PO LIQD: 237.0000 mL | Freq: Two times a day (BID) | ORAL | Status: DC

## 2015-02-24 MED ORDER — POTASSIUM CHLORIDE IN NACL 20-0.45 MEQ/L-% IV SOLN
INTRAVENOUS | Status: DC
  Administered 2015-02-24: 05:00:00 via INTRAVENOUS
  Filled 2015-02-24 (×2): qty 1000

## 2015-02-24 MED ORDER — ENSURE ENLIVE PO LIQD
237.0000 mL | Freq: Two times a day (BID) | ORAL | Status: DC
  Administered 2015-02-24 – 2015-02-26 (×5): 237 mL via ORAL

## 2015-02-24 MED ORDER — CEFAZOLIN SODIUM-DEXTROSE 2-3 GM-% IV SOLR: 2.0000 g | INTRAVENOUS | Status: DC

## 2015-02-24 MED ORDER — CHLORHEXIDINE GLUCONATE 4 % EX LIQD
60.0000 mL | Freq: Once | CUTANEOUS | Status: DC
  Filled 2015-02-24: qty 60

## 2015-02-24 MED ORDER — HYDROCODONE-ACETAMINOPHEN 5-325 MG PO TABS: 1.0000 | ORAL_TABLET | ORAL | Status: DC | PRN

## 2015-02-24 MED ORDER — HYDROCODONE-ACETAMINOPHEN 5-325 MG PO TABS
1.0000 | ORAL_TABLET | Freq: Three times a day (TID) | ORAL | Status: DC
  Administered 2015-02-24 – 2015-02-25 (×2): 1 via ORAL
  Filled 2015-02-24 (×2): qty 1

## 2015-02-24 MED ORDER — FENTANYL CITRATE (PF) 100 MCG/2ML IJ SOLN: 12.5000 ug | INTRAMUSCULAR | Status: DC | PRN

## 2015-02-24 MED ORDER — BISACODYL 10 MG RE SUPP: 10.0000 mg | Freq: Every day | RECTAL | Status: DC | PRN

## 2015-02-24 NOTE — Progress Notes (Signed)
Spoke with patients HCPOA. Goals are full comfort, no life prolonging interventions, no limitations- support her with reasonable interventions and treat symptoms aggressively. No rehabilitation attempt. HCPOA prefers to seek a LONG TERM CARE bed and they are requesting a hospice referral along with that- this patient has been seen by outpatient palliative provider with [facility] at home. I think a hospice referral is very appropriate at this point- given goal and need for aggressive pain and symptom mangement. Patient has responded to fentanyl and hydrocodone- I recommend scheduling these medications especially at SNF to ensure adequate control of pain.Patient has an advance directive- this has been scanned into the chart. I have placed a DNR order.   I discussed her current condition, progression of dementia, the hospice philosophy, management of her pain and possible trajectories of illness. I answered all of HCPOA questions.  We will follow.  Please make sure SNF request is for LTC bed with hospice.  Time: 12:45PM-1:20PM Total: 50 min  Lane Hacker, [place]

## 2015-02-24 NOTE — Progress Notes (Signed)
I had a long discussion with [name] prior, her power of attorney, as well as her sister and her niece. We discussed the options of surgical versus nonsurgical intervention. She has had progressive decline in function, very limited mental capacity, almost no ambulatory function, and they have elected for nonsurgical management. She has already been through the treatment of a hip fracture before that was complicated by periprosthetic fracture, and they do not wish to undergo the risks of surgery again.  We will plan for nonsurgical management of the left hip, given her risks and functional decline and overall life goals. I'll plan to order a palliative care, and she will likely need placement as I doubt the family will be capable of managing her long-term.  Johnny Bridge, MD

## 2015-02-24 NOTE — Consult Note (Signed)
ORTHOPAEDIC CONSULTATION  REQUESTING PHYSICIAN: Etta Quill, DO  Chief Complaint: Left hip pain  HPI: [name] is a 81 y.o. female who complains of left hip pain after a mechanical fall to the ground upon attempting to stand from her wheelchair unassisted earlier today. She is non-ambulatory at baseline after a R hip fracture last May.  She has a 24/7 care taker due to advanced dementia. Pain is worse with movement, better with rest. Injury occurred today.  He also denies any other injuries. She is a very poor historian.  Past Medical History  Diagnosis Date  . Dementia   . Osteoarthritis    Past Surgical History  Procedure Laterality Date  . Breast surgery    . Eye surgery    . Hip arthroplasty Right 01/01/2014    Procedure: BIPOLAR ARTHROPLASTY RIGHT HIP;  Surgeon: Carole Civil, MD;  Location: [facility];  Service: Orthopedics;  Laterality: Right;   History   Social History  . Marital Status: Widowed    Spouse Name: N/A  . Number of Children: 0  . Years of Education: 7th   Occupational History  .     Social History Main Topics  . Smoking status: Never Smoker   . Smokeless tobacco: Not on file  . Alcohol Use: No  . Drug Use: No  . Sexual Activity: No   Other Topics Concern  . None   Social History Narrative   No family history on file. I'm unable to get any family history, due to the patient unable to provide, and no family at the bedside.  No Known Allergies Prior to Admission medications   Medication Sig Start Date End Date Taking? Authorizing Provider  acetaminophen (TYLENOL) 500 MG tablet Take 500-1,000 mg by mouth every 6 (six) hours as needed for moderate pain or headache.   Yes Historical Provider, MD  alum & mag hydroxide-simeth (MAALOX/MYLANTA) 200-200-20 MG/5ML suspension Take 30 mLs by mouth every 4 (four) hours as needed for indigestion. 01/06/14  Yes Radene Gunning, NP  cholecalciferol (VITAMIN D) 1000 UNITS tablet Take 1,000 Units by  mouth daily.   Yes Historical Provider, MD  citalopram (CELEXA) 20 MG tablet Take 1 tablet by mouth daily. 01/26/15  Yes Historical Provider, MD  IRON PO Take 1 tablet by mouth daily.   Yes Historical Provider, MD  LORazepam (ATIVAN) 0.5 MG tablet Take 0.25-0.5 mg by mouth daily as needed for anxiety.  07/24/14  Yes Historical Provider, MD  Multiple Vitamins-Minerals (MULTIVITAMINS THER. W/MINERALS) TABS Take 1 tablet by mouth daily.   Yes Historical Provider, MD  polyethylene glycol (MIRALAX / GLYCOLAX) packet Take 7 g by mouth daily. Patient gets about a half-cap= 7-8g in her coffee every morning.   Yes Historical Provider, MD   Dg Chest 1 View  02/23/2015   CLINICAL DATA:  Fall, left hip trauma  EXAM: CHEST  1 VIEW  COMPARISON:  01/29/2014  FINDINGS: Mild prominence of the right paratracheal stripe is new since prior exams. Patient is rotated to the right. This could be in part projectional but could also indicate a vascular abnormality. Moderate enlargement of the cardiac silhouette is noted. Diffusely prominent interstitial reticular opacities are noted. No new focal opacity. Possible L1 compression deformity, incompletely evaluated at today's exam.  IMPRESSION: Possible widening of the right paratracheal stripe versus projectional artifact.  Possible L1 compression deformity but also possibly projectional on this frontal AP view only.  PA and lateral chest radiographs are  recommended for further evaluation of the above described findings.   Electronically Signed   By: Conchita Paris M.D.   On: 02/23/2015 17:25   Dg Hip Unilat With Pelvis 2-3 Views Left  02/23/2015   CLINICAL DATA:  Status post fall today. Left hip pain. Initial encounter.  EXAM: DG HIP (WITH OR WITHOUT PELVIS) 2-3V LEFT  COMPARISON:  None.  FINDINGS: The patient has an acute subcapital left hip fracture. No other acute abnormality is identified. The patient is status post right hip replacement. Calcified uterine fibroid is noted.   IMPRESSION: Acute subcapital left hip fracture.   Electronically Signed   By: Inge Rise M.D.   On: 02/23/2015 17:22    Positive ROS: All other systems have been reviewed and were otherwise negative with the exception of those mentioned in the HPI and as above.  Labs cbc  Recent Labs  02/23/15 1621  WBC 4.5  HGB 11.4*  HCT 35.3*  PLT 222    Labs inflam No results for input(s): CRP in the last 72 hours.  Invalid input(s): ESR  Labs coag  Recent Labs  02/23/15 1621  INR 0.98     Recent Labs  02/23/15 1621  NA 141  K 4.0  CL 106  CO2 27  GLUCOSE 132*  BUN 15  CREATININE 0.80  CALCIUM 9.0    Physical Exam: Filed Vitals:   02/23/15 2147  BP: 156/71  Pulse: 93  Temp: 98.2 F (36.8 C)  Resp: 17   General: Awake and able to answer questions but limited mentally due to dementia  Cardiovascular: No pedal edema Respiratory: No cyanosis, no use of accessory musculature GI: No organomegaly, abdomen is soft and non-tender Skin: No lesions in the area of chief complaint other than those listed below in MSK exam.  Neurologic: Sensation intact distally Psychiatric: Patient is not competent for consent.  No family or power of attorney are present at the bedside. Lymphatic: No axillary or cervical lymphadenopathy  MUSCULOSKELETAL:  Left hip pain with log roll.  Tender to palpation over the fracture site.  Sensation is intact with 2+ distal pulses.  Other extremities are atraumatic with painless ROM and NVI.  Assessment: Left hip displaced subcapital fractureIn a patient with advanced dementia and who is a nonambulator.  Plan: This is an acute severe injury, and even though she is a nonambulator, nonsurgical management carries significant risks, as well as difficulty management managing pain. This is however an option. I am going to plan to discuss with the family regarding both surgical and nonsurgical options. She broke her other hip earlier in the year, and  the family has artery been through this before.  I will plan to have a detailed conversation with the family tomorrow regarding the risks benefits and alternatives, digits to determine if in fact they wish to proceed with surgical intervention.   We will plan to take to the OR tomorrow for a L hip hemiarthroplasty Assuming that the family in fact does wish to proceed.  She will remain NPO after midnight.  Weight Bearing Status: bedrest VTE px: SCD's and chem prophylaxis on hold due to upcoming surgery   Bland Span Cell [phone]   02/24/2015 12:36 AM

## 2015-02-24 NOTE — Progress Notes (Signed)
Initial Nutrition Assessment  DOCUMENTATION CODES:  Not applicable  INTERVENTION:  Ensure Enlive po Daily, each supplement provides 350 kcal and 20 grams of protein  Magic cup daily, each supplement provides 290 kcal and 9 grams of protein  NUTRITION DIAGNOSIS:  Increased nutrient needs related to tissue healing s/p fall as evidenced by estimated nutritional requirements for the injury  GOAL:  Patient will meet greater than or equal to 90% of their needs  MONITOR:  PO intake, Supplement acceptance, Diet advancement, Skin, Weight trends, I & O's, Labs  REASON FOR ASSESSMENT:  Consult Hip fracture protocol  ASSESSMENT:  81 y.o. female with dementia, nonambulatory at baseline after R hip fracture in May of last year. Has 24/7 caregiver at home. Presents s/p fall after standing up from wheelchair. Assessed as Left hip displaced subcapital fracture  Unable to communicate with pt due to dementia. Fortunately, many family members were there. They report that she has been eating well. She eats 3 meals a day with snacks. The caregiver also provides her with 1-2 Ensures a day. They also report paying close attention to her fluid intake and give her water/pedialyte frequently. She also eats [employer], dispensing at home. Reportedly receives Iron, Calcium, Vit D, MVI as well.   While they dont know her "normal weight" they dont believe there have been any changes. The bed weight was 137.5 lbs, but this seems to be inaccurate-question if bed was zeroed on admission. Will use her older weight as dosing. No edema noted.   NFPE-limited but no wasting revealed  Overall, she appears to be in good nutritional status and has much family support.   Diet Order:  Diet NPO time specified Diet regular Room service appropriate?: Yes; Fluid consistency:: Thin  Skin:  Dry with multiple abrasion  Last BM:  7/22  Height:  Ht Readings from Last 1 Encounters:  09/28/14 5\' 3"  (1.6 m)  Height most consistently  reported as 5\' 2"   Weight:  Wt Readings from Last 1 Encounters:  07/12/14 105 lb (47.628 kg)  Unable to be weighed this admission  Ideal Body Weight:  50 kg  Wt Readings from Last 10 Encounters:  07/12/14 105 lb (47.628 kg)  07/03/14 105 lb (47.628 kg)  01/01/14 125 lb (56.7 kg)  11/29/12 116 lb (52.617 kg)  07/28/12 118 lb (53.524 kg)  03/20/12 118 lb (53.524 kg)  07/26/11 119 lb (53.978 kg)  Bed weight was 137.5. Likely very inaccurate  BMI:  There is no weight on file to calculate BMI. Estimated-BMI 19.2  Estimated Nutritional Needs:  Kcal:  1400-1600 (30-34 kcal/kg DW) Protein:  60-70 g (1.2-1.4 g/IBW) Fluid:  1.4-1.6 liters  EDUCATION NEEDS:  No education needs identified at this time  Burtis Junes RD, LDN Nutrition Pager: [phone] 02/24/2015 10:20 AM

## 2015-02-24 NOTE — Progress Notes (Signed)
Orthopedic Tech Progress Note Patient Details:  [name] August 04, 1925 [id number]  Patient ID: [name], female   DOB: [birth year], 81 y.o.   MRN: [id number] Pt unable to use trapeze bar patient helper  Hildred Priest 02/24/2015, 7:13 AM

## 2015-02-24 NOTE — Progress Notes (Signed)
[facility] PROGRESS NOTE  [name] VVO:[id number] DOB: 03-12-1925 DOA: 02/23/2015 PCP: Purvis Kilts, MD  Assessment/Plan:  Principal Problem:   Closed displaced fracture of left femoral neck Active Problems:   Dementia   HTN (hypertension)   Subcapital fracture of left hip  Family has opted for nonoperative management. Dr. Mardelle Matte has consulted palliative care team.  Code Status:  full Family Communication:  At bedside Disposition Plan:  home  Consultants:  Orthopedics  Palliative medicine  Procedures:     Antibiotics:    HPI/Subjective: No reported pain.  Objective: Filed Vitals:   02/24/15 0617  BP: 166/68  Pulse: 91  Temp: 97.5 F (36.4 C)  Resp: 17    Intake/Output Summary (Last 24 hours) at 02/24/15 1210 Last data filed at 02/24/15 0900  Gross per 24 hour  Intake      0 ml  Output      0 ml  Net      0 ml   There were no vitals filed for this visit.  Exam:   General:  Alert. Eating. Appears comfortable. Confused.  Cardiovascular: Regular rate rhythm without murmurs gallops rubs  Respiratory: Clear to auscultation bilaterally without wheeze rhonchi or rales  Abdomen: Soft nontender nondistended  Ext: No clubbing cyanosis or edema  Basic Metabolic Panel:  Recent Labs Lab 02/23/15 1621  NA 141  K 4.0  CL 106  CO2 27  GLUCOSE 132*  BUN 15  CREATININE 0.80  CALCIUM 9.0   Liver Function Tests:  Recent Labs Lab 02/23/15 1621  AST 25  ALT 18  ALKPHOS 51  BILITOT 0.3  PROT 6.6  ALBUMIN 3.4*   No results for input(s): LIPASE, AMYLASE in the last 168 hours. No results for input(s): AMMONIA in the last 168 hours. CBC:  Recent Labs Lab 02/23/15 1621  WBC 4.5  NEUTROABS 2.7  HGB 11.4*  HCT 35.3*  MCV 97.8  PLT 222   Cardiac Enzymes: No results for input(s): CKTOTAL, CKMB, CKMBINDEX, TROPONINI in the last 168 hours. BNP (last 3 results) No results for input(s): BNP in the last 8760  hours.  ProBNP (last 3 results) No results for input(s): PROBNP in the last 8760 hours.  CBG: No results for input(s): GLUCAP in the last 168 hours.  Recent Results (from the past 240 hour(s))  MRSA PCR Screening     Status: None   Collection Time: 02/24/15  4:48 AM  Result Value Ref Range Status   MRSA by PCR NEGATIVE NEGATIVE Final    Comment:        The GeneXpert MRSA Assay (FDA approved for NASAL specimens only), is one component of a comprehensive MRSA colonization surveillance program. It is not intended to diagnose MRSA infection nor to guide or monitor treatment for MRSA infections.      Studies: Dg Chest 1 View  02/23/2015   CLINICAL DATA:  Fall, left hip trauma  EXAM: CHEST  1 VIEW  COMPARISON:  01/29/2014  FINDINGS: Mild prominence of the right paratracheal stripe is new since prior exams. Patient is rotated to the right. This could be in part projectional but could also indicate a vascular abnormality. Moderate enlargement of the cardiac silhouette is noted. Diffusely prominent interstitial reticular opacities are noted. No new focal opacity. Possible L1 compression deformity, incompletely evaluated at today's exam.  IMPRESSION: Possible widening of the right paratracheal stripe versus projectional artifact.  Possible L1 compression deformity but also possibly projectional on this frontal AP view only.  PA and lateral chest radiographs are recommended for further evaluation of the above described findings.   Electronically Signed   By: Conchita Paris M.D.   On: 02/23/2015 17:25   Dg Hip Unilat With Pelvis 2-3 Views Left  02/23/2015   CLINICAL DATA:  Status post fall today. Left hip pain. Initial encounter.  EXAM: DG HIP (WITH OR WITHOUT PELVIS) 2-3V LEFT  COMPARISON:  None.  FINDINGS: The patient has an acute subcapital left hip fracture. No other acute abnormality is identified. The patient is status post right hip replacement. Calcified uterine fibroid is noted.   IMPRESSION: Acute subcapital left hip fracture.   Electronically Signed   By: Inge Rise M.D.   On: 02/23/2015 17:22    Scheduled Meds: . aspirin EC  325 mg Oral Daily  .  ceFAZolin (ANCEF) IV  2 g Intravenous To SSTC  . chlorhexidine  60 mL Topical Once  . citalopram  20 mg Oral Daily  . feeding supplement (ENSURE ENLIVE)  237 mL Oral BID BM  . polyethylene glycol  7 g Oral Daily   Continuous Infusions: . 0.45 % NaCl with KCl 20 mEq / L 100 mL/hr at 02/24/15 0442    Time spent: 15 minutes  [facility] [email or web address], password [facility] 02/24/2015, 12:10 PM  LOS: 1 day

## 2015-02-25 LAB — URINE CULTURE: Culture: NO GROWTH

## 2015-02-25 MED ORDER — MORPHINE SULFATE (CONCENTRATE) 10 MG/0.5ML PO SOLN
10.0000 mg | ORAL | Status: DC | PRN
  Filled 2015-02-25: qty 0.5

## 2015-02-25 MED ORDER — HYDROCODONE-ACETAMINOPHEN 7.5-325 MG/15ML PO SOLN
7.5000 mL | ORAL | Status: DC | PRN
  Administered 2015-02-25 – 2015-02-26 (×2): 7.5 mL via ORAL
  Filled 2015-02-25: qty 15

## 2015-02-25 MED ORDER — MORPHINE SULFATE (CONCENTRATE) 10 MG/0.5ML PO SOLN: 10.0000 mg | Freq: Four times a day (QID) | ORAL | Status: DC

## 2015-02-25 MED ORDER — HYDROCODONE-ACETAMINOPHEN 7.5-325 MG/15ML PO SOLN
7.5000 mL | Freq: Three times a day (TID) | ORAL | Status: DC
  Administered 2015-02-25 – 2015-02-26 (×2): 7.5 mL via ORAL
  Filled 2015-02-25 (×3): qty 15

## 2015-02-25 NOTE — Clinical Social Work Placement (Signed)
   CLINICAL SOCIAL WORK PLACEMENT  NOTE  Date:  02/25/2015  Patient Details  Name: [name] MRN: [id number] Date of Birth: 1925-03-02  Clinical Social Work is seeking post-discharge placement for this patient at the [place] level of care (*CSW will initial, date and re-position this form in  chart as items are completed):  Yes   Patient/family provided with [place] Work Department's list of facilities offering this level of care within the geographic area requested by the patient (or if unable, by the patient's family).  Yes   Patient/family informed of their freedom to choose among providers that offer the needed level of care, that participate in Medicare, Medicaid or managed care program needed by the patient, have an available bed and are willing to accept the patient.  Yes   Patient/family informed of [facility]'s ownership interest in [facility] and [facility], as well as of the fact that they are under no obligation to receive care at these facilities.  PASRR submitted to EDS on       PASRR number received on       Existing PASRR number confirmed on 02/25/15     FL2 transmitted to all facilities in geographic area requested by pt/family on 02/25/15     FL2 transmitted to all facilities within larger geographic area on       Patient informed that his/her managed care company has contracts with or will negotiate with certain facilities, including the following:            Patient/family informed of bed offers received.  Patient chooses bed at       Physician recommends and patient chooses bed at      Patient to be transferred to   on  .  Patient to be transferred to facility by       Patient family notified on   of transfer.  Name of family member notified:        PHYSICIAN Please sign DNR, Please sign FL2     Additional Comment:    _______________________________________________ Ladell Pier,  LCSW 02/25/2015, 2:59 PM

## 2015-02-25 NOTE — Progress Notes (Signed)
     Subjective:  S/P L hip fracture being managed non-operatively. Patient reports pain as moderate.  Resting in bed with family at the bedside this afternoon.  Complains of increased pain.  Palliative care was consulted and saw the patient yesterday.  She has been transitioned to comfort care.    Objective:   VITALS:   Filed Vitals:   02/24/15 0617 02/24/15 1313 02/24/15 2038 02/25/15 0535  BP: 166/68 136/60 135/61 161/67  Pulse: 91 83 78 78  Temp: 97.5 F (36.4 C) 98.1 F (36.7 C) 98.2 F (36.8 C) 97.5 F (36.4 C)  TempSrc: Oral  Oral Axillary  Resp: 17 17 16 15   SpO2: 100% 96% 98% 98%   Confusion due to dementia ABD soft Sensation intact distally Intact pulses distally Dorsiflexion/Plantar flexion intact   Lab Results  Component Value Date   WBC 4.5 02/23/2015   HGB 11.4* 02/23/2015   HCT 35.3* 02/23/2015   MCV 97.8 02/23/2015   PLT 222 02/23/2015   BMET    Component Value Date/Time   NA 141 02/23/2015 1621   K 4.0 02/23/2015 1621   CL 106 02/23/2015 1621   CO2 27 02/23/2015 1621   GLUCOSE 132* 02/23/2015 1621   BUN 15 02/23/2015 1621   CREATININE 0.80 02/23/2015 1621   CALCIUM 9.0 02/23/2015 1621   GFRNONAA >60 02/23/2015 1621   GFRAA >60 02/23/2015 1621     Assessment/Plan:     Principal Problem:   Closed displaced fracture of left femoral neck Active Problems:   Dementia   HTN (hypertension)   Subcapital fracture of left hip   Palliative care encounter  The family has elected for non-op management of her L hip fracture.  Palliative care has been consulted to help transition to comfort care.  We can continue to follow the patient on an outpatient basis, but this is at the families discretion.  Please contact us with any questions or concerns.      We are going to touch base with the palliative care team to see if they have any recommendations that could improve the patient's pain control.    [name] 02/25/2015, 2:58 PM Cell  [phone]

## 2015-02-25 NOTE — Progress Notes (Signed)
[facility] PROGRESS NOTE  [name] TKW:[id number] DOB: 09/17/1924 DOA: 02/23/2015 PCP: Purvis Kilts, MD  Assessment/Plan:  Principal Problem:   Closed displaced fracture of left femoral neck Active Problems:   Dementia   HTN (hypertension)   Subcapital fracture of left hip   Palliative care encounter  Family has opted for nonoperative management. Patient is now comfort care. D/w Education officer, museum  Code Status:  full Family Communication:  At bedside Disposition Plan:  Discharge when dispositioned  Consultants:  Orthopedics  Palliative medicine  Procedures:     Antibiotics:    HPI/Subjective: No reported pain.  Objective: Filed Vitals:   02/25/15 0535  BP: 161/67  Pulse: 78  Temp: 97.5 F (36.4 C)  Resp: 15    Intake/Output Summary (Last 24 hours) at 02/25/15 1242 Last data filed at 02/25/15 0536  Gross per 24 hour  Intake    120 ml  Output   1125 ml  Net  -1005 ml   There were no vitals filed for this visit.  Exam:   General:  Alert. Eating. Appears comfortable. Confused.  Cardiovascular: Regular rate rhythm without murmurs gallops rubs  Respiratory: Clear to auscultation bilaterally without wheeze rhonchi or rales  Abdomen: Soft nontender nondistended  Ext: No clubbing cyanosis or edema  Basic Metabolic Panel:  Recent Labs Lab 02/23/15 1621  NA 141  K 4.0  CL 106  CO2 27  GLUCOSE 132*  BUN 15  CREATININE 0.80  CALCIUM 9.0   Liver Function Tests:  Recent Labs Lab 02/23/15 1621  AST 25  ALT 18  ALKPHOS 51  BILITOT 0.3  PROT 6.6  ALBUMIN 3.4*   No results for input(s): LIPASE, AMYLASE in the last 168 hours. No results for input(s): AMMONIA in the last 168 hours. CBC:  Recent Labs Lab 02/23/15 1621  WBC 4.5  NEUTROABS 2.7  HGB 11.4*  HCT 35.3*  MCV 97.8  PLT 222   Cardiac Enzymes: No results for input(s): CKTOTAL, CKMB, CKMBINDEX, TROPONINI in the last 168 hours. BNP (last 3 results) No  results for input(s): BNP in the last 8760 hours.  ProBNP (last 3 results) No results for input(s): PROBNP in the last 8760 hours.  CBG: No results for input(s): GLUCAP in the last 168 hours.  Recent Results (from the past 240 hour(s))  Urine culture     Status: None   Collection Time: 02/23/15  9:09 PM  Result Value Ref Range Status   Specimen Description URINE, CATHETERIZED  Final   Special Requests NONE  Final   Culture   Final    NO GROWTH 1 DAY Performed at [facility]    Report Status 02/25/2015 FINAL  Final  MRSA PCR Screening     Status: None   Collection Time: 02/24/15  4:48 AM  Result Value Ref Range Status   MRSA by PCR NEGATIVE NEGATIVE Final    Comment:        The GeneXpert MRSA Assay (FDA approved for NASAL specimens only), is one component of a comprehensive MRSA colonization surveillance program. It is not intended to diagnose MRSA infection nor to guide or monitor treatment for MRSA infections.      Studies: Dg Chest 1 View  02/23/2015   CLINICAL DATA:  Fall, left hip trauma  EXAM: CHEST  1 VIEW  COMPARISON:  01/29/2014  FINDINGS: Mild prominence of the right paratracheal stripe is new since prior exams. Patient is rotated to the right. This could be in part  projectional but could also indicate a vascular abnormality. Moderate enlargement of the cardiac silhouette is noted. Diffusely prominent interstitial reticular opacities are noted. No new focal opacity. Possible L1 compression deformity, incompletely evaluated at today's exam.  IMPRESSION: Possible widening of the right paratracheal stripe versus projectional artifact.  Possible L1 compression deformity but also possibly projectional on this frontal AP view only.  PA and lateral chest radiographs are recommended for further evaluation of the above described findings.   Electronically Signed   By: Conchita Paris M.D.   On: 02/23/2015 17:25   Dg Hip Unilat With Pelvis 2-3 Views Left  02/23/2015    CLINICAL DATA:  Status post fall today. Left hip pain. Initial encounter.  EXAM: DG HIP (WITH OR WITHOUT PELVIS) 2-3V LEFT  COMPARISON:  None.  FINDINGS: The patient has an acute subcapital left hip fracture. No other acute abnormality is identified. The patient is status post right hip replacement. Calcified uterine fibroid is noted.  IMPRESSION: Acute subcapital left hip fracture.   Electronically Signed   By: Inge Rise M.D.   On: 02/23/2015 17:22    Scheduled Meds: . aspirin EC  325 mg Oral Daily  . chlorhexidine  60 mL Topical Once  . citalopram  20 mg Oral Daily  . feeding supplement (ENSURE ENLIVE)  237 mL Oral BID BM  . morphine CONCENTRATE  10 mg Oral 4 times per day  . senna-docusate  1 tablet Oral QHS   Continuous Infusions:    Time spent: 15 minutes  [facility] [email or web address], password [facility] 02/25/2015, 12:42 PM  LOS: 2 days

## 2015-02-25 NOTE — Clinical Social Work Note (Signed)
Clinical Social Work Assessment  Patient Details  Name: [name] MRN: [id number] Date of Birth: 1925-03-23  Date of referral:  02/25/15               Reason for consult:  Discharge Planning                Permission sought to share information with:  Family Supports Permission granted to share information::  No (pt has dementia-unable to participate in assessment-assessment completed with pt HCPOA)  Name::     [name]/HCPOA  Agency::     Relationship::  niece  Contact Information:  [phone]  Housing/Transportation Living arrangements for the past 2 months:  Single Family Home Source of Information:  Other (Comment Required) (pt niece, Water quality scientist Romero) Patient Interpreter Needed:    Criminal Activity/Legal Involvement Pertinent to Current Situation/Hospitalization:  No - Comment as needed Significant Relationships:  Siblings, Other Family Members Lives with:  Self (pt had caregivers at home per pt niece) Do you feel safe going back to the place where you live?    Need for family participation in patient care:  Yes (Comment)  Care giving concerns:  Pt from home alone. Pt had some caregivers at home, but suffered hip fracture. Pt family has opted for non-surgical intervention and transitioning to comfort care. Pt will need long term SNF placement.   Social Worker assessment / plan:  CSW received referral that pt will need long term SNF placement with hospice evaluation to be completed at SNF.   CSW visited pt bedside. Pt siblings at bedside, but reported that pt niece, [name] is pt HCPOA.   CSW contacted pt niece, Lydia Conn via telephone. CSW introduced self and explained role. Pt niece confirmed interest in exploring SNF for long term care. Pt niece reports that she is agreeable to Hospice to evaluate at SNF and reports that pt sister's will likely need more education surrounding hospice.   CSW discussed with pt niece that Medicare does not cover SNF if pt is not  going to be participating in rehab and per notes, pt is not a rehab candidate at this time. Pt niece expressed understanding and stated that pt has a long term care insurance policy that should assist in paying for SNF. Pt niece states that pt has been to [facility] in the past and [facility] is familiar with pt and familiar with pt long term care policy. Pt niece reports that pt has an individual that manages her finances that can communicate with the facility. Pt niece states that pt family first choice would be [facility]. Pt niece discussed that she is agreeable to referral to [facility] as well to have secondary option if needed.  CSW completed FL2 and sent pt information to [facility] and [facility]. CSW left message with [facility] admissions coordinator.   Weekday CSW to follow up with pt niece regarding responses from facilities and assist with facilitation of pt discharge needs. Anticipate d/c on Monday.  Employment status:  Retired Forensic scientist:  Medicare PT Recommendations:  Not assessed at this time [place] / Referral to community resources:  [place]  Patient/Family's Response to care:  Pt oriented to person only and unable to engage in assessment. Pt niece supportive and actively involved in pt care. Pt niece aware of need for long term care at SNF.   Patient/Family's Understanding of and Emotional Response to Diagnosis, Current Treatment, and Prognosis:  Pt niece understanding of pt diagnosis and prognosis. Pt family is very hopeful for [facility] as pt is familiar with facility from past stay.  Emotional Assessment Appearance:  Appears stated age Attitude/Demeanor/Rapport:  Unable to Assess Affect (typically observed):  Unable to Assess Orientation:  Oriented to Self Alcohol / Substance use:  Not Applicable Psych involvement (Current and /or in the community):  No  (Comment)  Discharge Needs  Concerns to be addressed:  Discharge Planning Concerns Readmission within the last 30 days:  No Current discharge risk:  Dependent with Mobility Barriers to Discharge:  No SNF bed (initiated search 7/24)   [name], [place], LCSW 02/25/2015, 2:45 PM Weekend Coverage [phone]

## 2015-02-26 ENCOUNTER — Inpatient Hospital Stay
Admission: RE | Admit: 2015-02-26 | Discharge: 2018-12-03 | Disposition: E | Payer: 59 | Source: Ambulatory Visit | Attending: Internal Medicine | Admitting: Internal Medicine

## 2015-02-26 DIAGNOSIS — F0391 Unspecified dementia with behavioral disturbance: Secondary | ICD-10-CM

## 2015-02-26 DIAGNOSIS — T148XXA Other injury of unspecified body region, initial encounter: Principal | ICD-10-CM

## 2015-02-26 MED ORDER — SENNOSIDES-DOCUSATE SODIUM 8.6-50 MG PO TABS: 1.0000 | ORAL_TABLET | Freq: Every day | ORAL | Status: DC

## 2015-02-26 MED ORDER — BISACODYL 10 MG RE SUPP: 10.0000 mg | Freq: Every day | RECTAL | Status: DC | PRN

## 2015-02-26 MED ORDER — HYDROCODONE-ACETAMINOPHEN 7.5-325 MG/15ML PO SOLN: 7.5000 mL | Freq: Three times a day (TID) | ORAL | Status: DC

## 2015-02-26 MED ORDER — LORAZEPAM 0.5 MG PO TABS: 0.5000 mg | ORAL_TABLET | ORAL | Status: DC | PRN

## 2015-02-26 NOTE — Progress Notes (Signed)
Patient resting comfortably in bed.  I'm happy to see her as desired as an outpatient.  Please call with questions.  Marchia Bond, M.D.

## 2015-02-26 NOTE — Discharge Planning (Signed)
Patient to be discharged to [facility]. Patient's niece/HCPOA updated at bedside.  Facility: [facility] RN report number: [phone] Transportation: EMS ([address])  [place], [place] [phone]) and Surgical [phone])

## 2015-02-26 NOTE — Clinical Social Work Placement (Signed)
   CLINICAL SOCIAL WORK PLACEMENT  NOTE  Date:  02/14/2015  Patient Details  Name: [name] MRN: [id number] Date of Birth: 07-21-1925  Clinical Social Work is seeking post-discharge placement for this patient at the [place] level of care (*CSW will initial, date and re-position this form in  chart as items are completed):  Yes   Patient/family provided with [facility] Work Department's list of facilities offering this level of care within the geographic area requested by the patient (or if unable, by the patient's family).  Yes   Patient/family informed of their freedom to choose among providers that offer the needed level of care, that participate in Medicare, Medicaid or managed care program needed by the patient, have an available bed and are willing to accept the patient.  Yes   Patient/family informed of [facility]'s ownership interest in [facility] and [facility], as well as of the fact that they are under no obligation to receive care at these facilities.  PASRR submitted to EDS on       PASRR number received on       Existing PASRR number confirmed on 02/25/15     FL2 transmitted to all facilities in geographic area requested by pt/family on 02/25/15     FL2 transmitted to all facilities within larger geographic area on       Patient informed that his/her managed care company has contracts with or will negotiate with certain facilities, including the following:        Yes   Patient/family informed of bed offers received.  Patient chooses bed at [facility]     Physician recommends and patient chooses bed at  (n/a)    Patient to be transferred to [facility] on 02/23/2015.  Patient to be transferred to facility by [facility]     Patient family notified on 02/18/2015 of transfer.  Name of family member notified:  Patient's niece/HCPOA, Heather Prior     PHYSICIAN       Additional Comment:     _______________________________________________ Caroline Sauger, LCSW 02/02/2015, 12:02 PM [phone]

## 2015-02-26 NOTE — Care Management Important Message (Signed)
Important Message  Patient Details  Name: [name] MRN: [id number] Date of Birth: 05-25-1925   Medicare Important Message Given:  [facility] notification given    Nathen May 02/07/2015, 2:34 [place] Message  Patient Details  Name: [name] MRN: [id number] Date of Birth: 03-27-1925   Medicare Important Message Given:  Yes-second notification given    Nathen May 02/16/2015, 2:34 PM

## 2015-02-26 NOTE — Progress Notes (Addendum)
Pt ready for d/c per MD. Report called to [place] at [facility]. Will be d/c with foley, peripheral IVs removed. Belongings gathered and will be sent with pt. Waiting for transport via [place].  Raquel James 02/20/2015 2:52 PM

## 2015-02-26 NOTE — Progress Notes (Signed)
   Daily Progress Note   Patient Name: [name]       Date: 02/12/2015 DOB: Oct 01, 1924  Age: 81 y.o. MRN#: [id number] Attending Physician: Delfina Redwood, MD Primary Care Physician: Purvis Kilts, MD Admit Date: 02/23/2015  Reason for Consultation/Follow-up: Pain control  Subjective: Comfortable after receiving PO hydromorphone liquid.Family at bedside.  Length of Stay: 3 days  Current Medications: Scheduled Meds:  . aspirin EC  325 mg Oral Daily  . chlorhexidine  60 mL Topical Once  . citalopram  20 mg Oral Daily  . feeding supplement (ENSURE ENLIVE)  237 mL Oral BID BM  . HYDROcodone-acetaminophen  7.5 mL Oral TID  . senna-docusate  1 tablet Oral QHS    Continuous Infusions:    PRN Meds: bisacodyl, fentaNYL (SUBLIMAZE) injection, HYDROcodone-acetaminophen, LORazepam  Palliative Performance Scale: 40      Vital Signs: BP 134/60 mmHg  Pulse 79  Temp(Src) 98.4 F (36.9 C) (Oral)  Resp 15  SpO2 98% SpO2: SpO2: 98 % O2 Device: O2 Device: Not Delivered O2 Flow Rate:    Intake/output summary:  Intake/Output Summary (Last 24 hours) at 02/27/2015 1519 Last data filed at 02/22/2015 1200  Gross per 24 hour  Intake      0 ml  Output   1125 ml  Net  -1125 ml   LBM:   Baseline Weight:   Most recent weight:    Physical Exam: Pale, Frail , elderly, confused   Additional Data Reviewed: Recent Labs     02/23/15  1621  WBC  4.5  HGB  11.4*  PLT  222  NA  141  BUN  15  CREATININE  0.80     Problem List:  Patient Active Problem List   Diagnosis Date Noted  . Palliative care encounter   . Closed displaced fracture of left femoral neck 02/23/2015  . Subcapital fracture of left hip 02/23/2015  . HTN (hypertension) 03/03/2014  . Senile osteoporosis 01/09/2014  . Abnormality of gait 01/09/2014  . UTI (urinary tract infection) 01/05/2014  . Thrombocytopenia, unspecified 01/03/2014  . Sinus tachycardia 01/03/2014  . Acute blood loss anemia  01/02/2014  . Leukocytosis, unspecified 01/02/2014  . Fracture of femoral neck, right, closed 01/01/2014  . Femoral neck fracture 01/01/2014  . Dementia 11/29/2012  . Depression 11/29/2012     Palliative Care Assessment & Plan    Code Status:  DNR  Goals of Care:  Full comfort  Desire for further Chaplaincy support:no  3. Symptom Management:  She does not tolerate morphine.  Family requesting to continue hydrocodone- I have changed this to liquid form  ATivan PRN for agitation  4. Palliative Prophylaxis:  Stool [place]: YES  5. Prognosis: < 6 months  5. Discharge Planning: [place] with Hospice   Care plan was discussed with Family, attending and CSW  Thank you for allowing the Palliative Medicine Team to assist in the care of this patient.   Time In: 3PM Time Out: 3:15PM Total Time 12min Prolonged Time Billed no    Greater than 50%  of this time was spent counseling and coordinating care related to the above assessment and plan.   Acquanetta Chain, DO  02/08/2015, 3:19 PM  Please contact Palliative Medicine Team phone at [phone] for questions and concerns.

## 2015-02-26 NOTE — Discharge Summary (Signed)
Physician Discharge Summary  [name] KNL:[id number] DOB: Jun 14, 1925 DOA: 02/23/2015  PCP: Purvis Kilts, MD  Admit date: 02/23/2015 Discharge date: 02/09/2015  Time spent: greater than 30 minutes  Recommendations for Outpatient Follow-up:  1. Patient is now comfort care will go to skilled nursing facility with hospice.  Discharge Diagnoses:  Principal Problem:   Closed displaced fracture of left femoral neck, family has opted for nonoperative management and transition to hospice/comfort care. Active Problems:   Advanced Dementia   HTN (hypertension)   Subcapital fracture of left hip   Palliative care encounter   Discharge Condition: Stable  Diet recommendation: General as tolerated  CODE STATUS: DO NOT RESUSCITATE/comfort measures only. No rehospitalization.  There were no vitals filed for this visit.  History of present illness:  81 y.o. female with dementia, does not walk at baseline following a R hip fracture in May of last year. Has 24/7 caregiver at home. Today she apparently decided to try and stand up from her wheelchair at home (cannot do this at baseline) unassisted, which resulted in a mechanical fall. Left hip pain following fall. Thankfully has 24/7 care at home so EMS called right away.  Hospital Course:  Patient was admitted to hospitalists. Orthopedics consulted. Family reports patient has declined clinically since last year when she suffered a hip fracture on the right side. She is essentially bedbound. After much discussion, the decision was made to forego surgery and palliative medicine was consulted. Goals of care discussion was had and she is now comfort measures/DO NOT RESUSCITATE. She is awaiting a bed at skilled nursing facility for long-term care/comfort care. She is stable for discharge once disposition is found. She appears comfortable currently and is medically stable.  Procedures: None  Consultations:  None  Discharge Exam: Filed  Vitals:   02/08/2015 0447  BP: 159/62  Pulse: 92  Temp: 97.8 F (36.6 C)  Resp: 16    General: Asleep. Comfortable. Cardiovascular: Regular rate rhythm without murmurs gallops rubs Respiratory: Clear to auscultation bilaterally without wheezes rhonchi or rales Extremities: No edema.  Left leg internally rotated  Discharge Instructions   Discharge Instructions    Bed rest    Complete by:  As directed      Diet general    Complete by:  As directed           Current Discharge Medication List    START taking these medications   Details  bisacodyl (DULCOLAX) 10 MG suppository Place 1 suppository (10 mg total) rectally daily as needed for moderate constipation or severe constipation. Qty: 12 suppository, Refills: 0    HYDROcodone-acetaminophen (HYCET) 7.5-325 mg/15 ml solution Take 7.5 mLs by mouth 3 (three) times daily. Qty: 120 mL, Refills: 0    senna-docusate (SENOKOT-S) 8.6-50 MG per tablet Take 1 tablet by mouth at bedtime.      CONTINUE these medications which have CHANGED   Details  LORazepam (ATIVAN) 0.5 MG tablet Take 1 tablet (0.5 mg total) by mouth every 4 (four) hours as needed for anxiety. Qty: 20 tablet, Refills: 0      CONTINUE these medications which have NOT CHANGED   Details  acetaminophen (TYLENOL) 500 MG tablet Take 500-1,000 mg by mouth every 6 (six) hours as needed for moderate pain or headache.    alum & mag hydroxide-simeth (MAALOX/MYLANTA) 200-200-20 MG/5ML suspension Take 30 mLs by mouth every 4 (four) hours as needed for indigestion. Qty: 355 mL, Refills: 0    citalopram (CELEXA) 20 MG tablet  Take 1 tablet by mouth daily.      STOP taking these medications     cholecalciferol (VITAMIN D) 1000 UNITS tablet      IRON PO      Multiple Vitamins-Minerals (MULTIVITAMINS THER. W/MINERALS) TABS      polyethylene glycol (MIRALAX / GLYCOLAX) packet        Allergies  Allergen Reactions  . Morphine And Related     Increased confusion    Follow-up Information    Follow up with Johnny Bridge, MD.   Specialty:  Orthopedic Surgery   Why:  as needed/desired by family   Contact information:   [place]. [place] [address] [phone]        The results of significant diagnostics from this hospitalization (including imaging, microbiology, ancillary and laboratory) are listed below for reference.    Significant Diagnostic Studies: Dg Chest 1 View  02/23/2015   CLINICAL DATA:  Fall, left hip trauma  EXAM: CHEST  1 VIEW  COMPARISON:  01/29/2014  FINDINGS: Mild prominence of the right paratracheal stripe is new since prior exams. Patient is rotated to the right. This could be in part projectional but could also indicate a vascular abnormality. Moderate enlargement of the cardiac silhouette is noted. Diffusely prominent interstitial reticular opacities are noted. No new focal opacity. Possible L1 compression deformity, incompletely evaluated at today's exam.  IMPRESSION: Possible widening of the right paratracheal stripe versus projectional artifact.  Possible L1 compression deformity but also possibly projectional on this frontal AP view only.  PA and lateral chest radiographs are recommended for further evaluation of the above described findings.   Electronically Signed   By: Conchita Paris M.D.   On: 02/23/2015 17:25   Dg Hip Unilat With Pelvis 2-3 Views Left  02/23/2015   CLINICAL DATA:  Status post fall today. Left hip pain. Initial encounter.  EXAM: DG HIP (WITH OR WITHOUT PELVIS) 2-3V LEFT  COMPARISON:  None.  FINDINGS: The patient has an acute subcapital left hip fracture. No other acute abnormality is identified. The patient is status post right hip replacement. Calcified uterine fibroid is noted.  IMPRESSION: Acute subcapital left hip fracture.   Electronically Signed   By: Inge Rise M.D.   On: 02/23/2015 17:22    Microbiology: Recent Results (from the past 240 hour(s))  Urine culture      Status: None   Collection Time: 02/23/15  9:09 PM  Result Value Ref Range Status   Specimen Description URINE, CATHETERIZED  Final   Special Requests NONE  Final   Culture   Final    NO GROWTH 1 DAY Performed at [facility]    Report Status 02/25/2015 FINAL  Final  MRSA PCR Screening     Status: None   Collection Time: 02/24/15  4:48 AM  Result Value Ref Range Status   MRSA by PCR NEGATIVE NEGATIVE Final    Comment:        The GeneXpert MRSA Assay (FDA approved for NASAL specimens only), is one component of a comprehensive MRSA colonization surveillance program. It is not intended to diagnose MRSA infection nor to guide or monitor treatment for MRSA infections.      Labs: Basic Metabolic Panel:  Recent Labs Lab 02/23/15 1621  NA 141  K 4.0  CL 106  CO2 27  GLUCOSE 132*  BUN 15  CREATININE 0.80  CALCIUM 9.0   Liver Function Tests:  Recent Labs Lab 02/23/15 1621  AST 25  ALT 18  ALKPHOS 51  BILITOT 0.3  PROT 6.6  ALBUMIN 3.4*   No results for input(s): LIPASE, AMYLASE in the last 168 hours. No results for input(s): AMMONIA in the last 168 hours. CBC:  Recent Labs Lab 02/23/15 1621  WBC 4.5  NEUTROABS 2.7  HGB 11.4*  HCT 35.3*  MCV 97.8  PLT 222   Cardiac Enzymes: No results for input(s): CKTOTAL, CKMB, CKMBINDEX, TROPONINI in the last 168 hours. BNP: BNP (last 3 results) No results for input(s): BNP in the last 8760 hours.  ProBNP (last 3 results) No results for input(s): PROBNP in the last 8760 hours.  CBG: No results for input(s): GLUCAP in the last 168 hours.     SignedDelfina Redwood  [facility] 02/22/2015, 9:47 AM

## 2015-02-28 ENCOUNTER — Non-Acute Institutional Stay (SKILLED_NURSING_FACILITY): Payer: Medicare Other | Admitting: Internal Medicine

## 2015-02-28 DIAGNOSIS — Z978 Presence of other specified devices: Secondary | ICD-10-CM

## 2015-02-28 DIAGNOSIS — I10 Essential (primary) hypertension: Secondary | ICD-10-CM

## 2015-02-28 DIAGNOSIS — M25552 Pain in left hip: Secondary | ICD-10-CM | POA: Diagnosis not present

## 2015-02-28 DIAGNOSIS — S72142E Displaced intertrochanteric fracture of left femur, subsequent encounter for open fracture type I or II with routine healing: Secondary | ICD-10-CM

## 2015-02-28 DIAGNOSIS — Z9889 Other specified postprocedural states: Secondary | ICD-10-CM

## 2015-02-28 DIAGNOSIS — Z960 Presence of urogenital implants: Secondary | ICD-10-CM

## 2015-02-28 NOTE — Progress Notes (Signed)
Patient ID: [name], female   DOB: 28-May-1925, 81 y.o.   MRN: [id number]     Facility; [facility] Chief complaint; admission to SNF post admit to [facility] from 7/22 to 7/25  History;. This is a patient who was here in June 2015 at which point she had suffered a right hip fracture undergoing a bipolar hip replacement. It was noted at that time that she had advanced dementia, severe protein calorie malnutrition, gait ataxia with frequent falls, osteoporosis and was living at home with 24/7 sitters. She was eventually discharged to home. Recently she is remained nonambulatory. She is bed to lift chair to wheelchair type existence. She still has 24/7 home sitters and a group of nieces and nephews look after her affairs.  On this occasion she apparently tried to stand up from her wheelchair she fell she had left hip pain. X-ray on 02/23/15 showed a an acute sub-Left hip fracture.  Past Medical History  Diagnosis Date  . Dementia   . Osteoarthritis   .osteoperosis .protein calorie malnutrition hypertension   Past Surgical History  Procedure Laterality Date  . Breast surgery    . Eye surgery    . Hip arthroplasty Right 01/01/2014    Procedure: BIPOLAR ARTHROPLASTY RIGHT HIP;  Surgeon: Carole Civil, MD;  Location: [facility];  Service: Orthopedics;  Laterality: Right;    Current Outpatient Prescriptions on File Prior to Visit  Medication Sig Dispense Refill  . acetaminophen (TYLENOL) 500 MG tablet Take 500-1,000 mg by mouth every 6 (six) hours as needed for moderate pain or headache.    Marland Kitchen alum & mag hydroxide-simeth (MAALOX/MYLANTA) 200-200-20 MG/5ML suspension Take 30 mLs by mouth every 4 (four) hours as needed for indigestion. 355 mL 0  . bisacodyl (DULCOLAX) 10 MG suppository Place 1 suppository (10 mg total) rectally daily as needed for moderate constipation or severe constipation. 12 suppository 0  . citalopram (CELEXA) 20 MG tablet Take 1 tablet by mouth daily.    Marland Kitchen  HYDROcodone-acetaminophen (HYCET) 7.5-325 mg/15 ml solution Take 7.5 mLs by mouth 3 (three) times daily. 120 mL 0  . LORazepam (ATIVAN) 0.5 MG tablet Take 1 tablet (0.5 mg total) by mouth every 4 (four) hours as needed for anxiety. 20 tablet 0  . senna-docusate (SENOKOT-S) 8.6-50 MG per tablet Take 1 tablet by mouth at bedtime.      Social; patient was living at her own home and [place]. As noted she had around-the-clock sitters. She had deteriorated quite a bit from when the last time she was in this building and was essentially nonambulatory apparently using some form of Rollator walker. I think she was at dependent in all ADLs except perhaps feeding.  reports that she has never smoked. She does not have any smokeless tobacco history on file. She reports that she does not drink alcohol or use illicit drugs.  Family hx indicated that her mother is deceased. She indicated that her father is deceased.   Review of Systems;  Patient is far too demented to [facility] participate.  Marginal oral intake   Physical Exam:  General: Patient is awake allert Heent: Moist oral mucous membranes Resp: clear a/e bilaterally CVS: HS Normal she is not dehydrated.  ABD; No abd tenderess. No liver no spleen GU foley cath in place. No suprapubioc no CVA tenderness MSK; the patient is in a lot of pain. I would imagine trying to turn this woman for basic care would be extremely difficult. I also had a lot  of difficulty moving the  uninvolved [right] leg. I am really not sure whether this is fear, pain or a neurologic problem. We'll need to reassess this. Skin; no issues on her heels however I could not turn this woman over enough to really look at her coccyx buttock's Mental status; the patient is bright alert severe dementia.  Impression/plan #1 left hip fracture managed nonoperatively. There is suggestion here about "Comfort Care" although his usual I really don't know exactly what this means. I'll try to  see if I can access the palliative care record in order to get a better feel for this. #2 uncontrolled pain in the left hip. This is going to be a care issue here. I think she is going to need some long-acting narcotic in place of the hydrocodone which can be then given when necessary. This may result in altered LOC however if the goal is truly comfort here I can't imagine that this lady's stay is going to become tubal especially when it comes to incontinence care, turning etc. #3 Foley catheter. I'm not exactly sure why this was put in but I think it should continue I think the patient would be in a lot of discomfort to take this out and resultant more frequent patient needs. I think it is entirely reasonable to continue this for now. I don't believe there was a urinary retention issue. #4 hypertension; she is not on any medications for this and I certainly agree that this doesn't have a lot of medical urgency here. #5 severe dementia; was also true when she was here in 2015. We did manage to get her up after the right hip fracture, this is not going to be in the care plan now #6 history of protein calorie malnutrition, her albumen was 3.4 in the hospital indicative of moderate protein calorie malnutrition. I actually think it is her oral intake of both food and fluid sitters going to dictate this lady's prognosis and whether she is actually going to be truly hospice eligible.Marland Kitchen

## 2015-03-02 ENCOUNTER — Other Ambulatory Visit: Payer: Self-pay | Admitting: *Deleted

## 2015-03-02 ENCOUNTER — Non-Acute Institutional Stay (SKILLED_NURSING_FACILITY): Payer: Medicare Other | Admitting: Internal Medicine

## 2015-03-02 ENCOUNTER — Encounter: Payer: Self-pay | Admitting: Internal Medicine

## 2015-03-02 DIAGNOSIS — S72012D Unspecified intracapsular fracture of left femur, subsequent encounter for closed fracture with routine healing: Secondary | ICD-10-CM | POA: Diagnosis not present

## 2015-03-02 DIAGNOSIS — H109 Unspecified conjunctivitis: Secondary | ICD-10-CM

## 2015-03-02 MED ORDER — HYDROCODONE-ACETAMINOPHEN 7.5-325 MG/15ML PO SOLN: ORAL | Status: DC

## 2015-03-02 NOTE — Progress Notes (Signed)
Patient ID: [name], female   DOB: 1924-11-19, 81 y.o.   MRN: [id number]       Facility; [facility] Chief complaint--acute visit secondary to left eye drainage.follow-up left hip pain  HPI;. This is a patient who was here in June 2015 at which point she had suffered a right hip fracture undergoing a bipolar hip replacement. It was noted at that time that she had advanced dementia, severe protein calorie malnutrition, gait ataxia with frequent falls, osteoporosis and was living at home with 24/7 sitters. She was eventually discharged to home. Recently she is remained nonambulatory. She is bed to lift chair to wheelchair type existence. She still has 24/7 home sitters and a group of nieces and nephews look after her affairs.  On this occasion she apparently tried to stand up from her wheelchair she fell she had left hip pain. X-ray on 02/23/15 showed a an acute sub-Left hip fracture.  Dr. Dellia Nims was concerned earlier this week about pain management she was receiving Vicodin 7. 12/05/2023 milligrams 3 times a day and half a tab every 4 hours when necessary-he did add a Duragesic patch and this appears to be helping.  Nursing staff also is noted some left eye drainage and I'm following up on this.  Patient has severe dementia and cannot really give any review of systems  Past Medical History  Diagnosis Date  . Dementia   . Osteoarthritis   .osteoperosis .protein calorie malnutrition hypertension   Past Surgical History  Procedure Laterality Date  . Breast surgery    . Eye surgery    . Hip arthroplasty Right 01/01/2014    Procedure: BIPOLAR ARTHROPLASTY RIGHT HIP;  Surgeon: Carole Civil, MD;  Location: [facility];  Service: Orthopedics;  Laterality: Right;    Current Outpatient Prescriptions on File Prior to Visit  Medication Sig Dispense Refill  . acetaminophen (TYLENOL) 500 MG tablet Take 500-1,000 mg by mouth every 6 (six) hours as needed for moderate pain or headache.    Marland Kitchen  alum & mag hydroxide-simeth (MAALOX/MYLANTA) 200-200-20 MG/5ML suspension Take 30 mLs by mouth every 4 (four) hours as needed for indigestion. 355 mL 0  . bisacodyl (DULCOLAX) 10 MG suppository Place 1 suppository (10 mg total) rectally daily as needed for moderate constipation or severe constipation. 12 suppository 0  . citalopram (CELEXA) 20 MG tablet Take 1 tablet by mouth daily.    Marland Kitchen HYDROcodone-acetaminophen (HYCET) 7.5-325 mg/15 ml solution Take 7.5 mLs by mouth 3 (three) times daily. 120 mL 0  . LORazepam (ATIVAN) 0.5 MG tablet Take 1 tablet (0.5 mg total) by mouth every 4 (four) hours as needed for anxiety. 20 tablet 0  . senna-docusate (SENOKOT-S) 8.6-50 MG per tablet Take 1 tablet by mouth at bedtime.      Social; patient was living at her own home and [place]. As noted she had around-the-clock sitters. She had deteriorated quite a bit from when the last time she was in this building and was essentially nonambulatory apparently using some form of Rollator walker. I think she was at dependent in all ADLs except perhaps feeding.  reports that she has never smoked. She does not have any smokeless tobacco history on file. She reports that she does not drink alcohol or use illicit drugs.  Family hx indicated that her mother is deceased. She indicated that her father is deceased.   Review of Systems;  Patient is far too demented to [facility] participate.  Marginal oral intake   Physical Exam:  Temperature is 98.1 pulse 60 respirations 20 blood pressure 130/54 General: Patient is awake allert Skin is warm and dry.  Eyes she does have minimal dried somewhat dark colored exudate medial margin of the left eye conjunctiva appears to be possibly slightly erythematous I do not really note that exudate or increased erythema of the right eye at this time pupils appear reactive visual acuity appears grossly intact Heent: Moist oral mucous membranes Resp: clear a/e bilaterally CVS: HS Normal she  is not dehydrated.  ABD; No abd tenderess. No liver no spleen GU foley cath in place. No suprapubioc no CVA tenderness MSK; -did not note any deformities patient does not appear to have significant pain at this point her legs are elevated bilaterally .  s Mental status; the patient is bright alert severe dementia.  Impression/plan #1 suspect conjunctivitis left eye Will treat with CiproOpth solution 0.3% 2 drops 3 times a day for 5 days and monitor--on physical exam this is not real impressive but there is some dark colored exudate we'll see how she responds to the Cipro    #2left hip fracture managed nonoperatively.  At this point pain appears to be controlled since the Duragesic patch was instituted will have to monitor I did not note any signs of sedation but this will have to be watched.  [id number]  .Marland Kitchen

## 2015-03-02 NOTE — Telephone Encounter (Signed)
[facility] 

## 2015-03-05 ENCOUNTER — Other Ambulatory Visit: Payer: Self-pay | Admitting: *Deleted

## 2015-03-05 MED ORDER — HYDROCODONE-ACETAMINOPHEN 7.5-325 MG/15ML PO SOLN: ORAL | Status: DC

## 2015-03-05 NOTE — Telephone Encounter (Signed)
[facility] 

## 2015-03-05 DEATH — deceased

## 2015-03-07 ENCOUNTER — Other Ambulatory Visit: Payer: Self-pay | Admitting: *Deleted

## 2015-03-07 MED ORDER — HYDROCODONE-ACETAMINOPHEN 7.5-325 MG/15ML PO SOLN: ORAL | Status: DC

## 2015-03-12 DIAGNOSIS — F0390 Unspecified dementia without behavioral disturbance: Secondary | ICD-10-CM | POA: Diagnosis not present

## 2015-03-12 DIAGNOSIS — S72002D Fracture of unspecified part of neck of left femur, subsequent encounter for closed fracture with routine healing: Secondary | ICD-10-CM | POA: Diagnosis not present

## 2015-03-12 DIAGNOSIS — M1990 Unspecified osteoarthritis, unspecified site: Secondary | ICD-10-CM | POA: Diagnosis not present

## 2015-03-12 DIAGNOSIS — F339 Major depressive disorder, recurrent, unspecified: Secondary | ICD-10-CM | POA: Diagnosis not present

## 2015-03-12 DIAGNOSIS — R1312 Dysphagia, oropharyngeal phase: Secondary | ICD-10-CM | POA: Diagnosis not present

## 2015-03-12 DIAGNOSIS — M810 Age-related osteoporosis without current pathological fracture: Secondary | ICD-10-CM | POA: Diagnosis not present

## 2015-03-12 DIAGNOSIS — E43 Unspecified severe protein-calorie malnutrition: Secondary | ICD-10-CM | POA: Diagnosis not present

## 2015-03-13 DIAGNOSIS — S72002D Fracture of unspecified part of neck of left femur, subsequent encounter for closed fracture with routine healing: Secondary | ICD-10-CM | POA: Diagnosis not present

## 2015-03-13 DIAGNOSIS — E43 Unspecified severe protein-calorie malnutrition: Secondary | ICD-10-CM | POA: Diagnosis not present

## 2015-03-13 DIAGNOSIS — R1312 Dysphagia, oropharyngeal phase: Secondary | ICD-10-CM | POA: Diagnosis not present

## 2015-03-13 DIAGNOSIS — F339 Major depressive disorder, recurrent, unspecified: Secondary | ICD-10-CM | POA: Diagnosis not present

## 2015-03-13 DIAGNOSIS — F0390 Unspecified dementia without behavioral disturbance: Secondary | ICD-10-CM | POA: Diagnosis not present

## 2015-03-13 DIAGNOSIS — M1990 Unspecified osteoarthritis, unspecified site: Secondary | ICD-10-CM | POA: Diagnosis not present

## 2015-03-14 ENCOUNTER — Other Ambulatory Visit: Payer: Self-pay

## 2015-03-14 DIAGNOSIS — S72002D Fracture of unspecified part of neck of left femur, subsequent encounter for closed fracture with routine healing: Secondary | ICD-10-CM | POA: Diagnosis not present

## 2015-03-14 DIAGNOSIS — M1990 Unspecified osteoarthritis, unspecified site: Secondary | ICD-10-CM | POA: Diagnosis not present

## 2015-03-14 DIAGNOSIS — F339 Major depressive disorder, recurrent, unspecified: Secondary | ICD-10-CM | POA: Diagnosis not present

## 2015-03-14 DIAGNOSIS — F0390 Unspecified dementia without behavioral disturbance: Secondary | ICD-10-CM | POA: Diagnosis not present

## 2015-03-14 DIAGNOSIS — E43 Unspecified severe protein-calorie malnutrition: Secondary | ICD-10-CM | POA: Diagnosis not present

## 2015-03-14 DIAGNOSIS — R1312 Dysphagia, oropharyngeal phase: Secondary | ICD-10-CM | POA: Diagnosis not present

## 2015-03-14 MED ORDER — HYDROCODONE-ACETAMINOPHEN 7.5-325 MG/15ML PO SOLN: ORAL | Status: DC

## 2015-03-14 NOTE — Telephone Encounter (Signed)
RX faxed to [facility] @ [phone]. Phone number [phone]  

## 2015-03-15 DIAGNOSIS — S72002D Fracture of unspecified part of neck of left femur, subsequent encounter for closed fracture with routine healing: Secondary | ICD-10-CM | POA: Diagnosis not present

## 2015-03-15 DIAGNOSIS — R1312 Dysphagia, oropharyngeal phase: Secondary | ICD-10-CM | POA: Diagnosis not present

## 2015-03-15 DIAGNOSIS — F0390 Unspecified dementia without behavioral disturbance: Secondary | ICD-10-CM | POA: Diagnosis not present

## 2015-03-15 DIAGNOSIS — E43 Unspecified severe protein-calorie malnutrition: Secondary | ICD-10-CM | POA: Diagnosis not present

## 2015-03-15 DIAGNOSIS — F339 Major depressive disorder, recurrent, unspecified: Secondary | ICD-10-CM | POA: Diagnosis not present

## 2015-03-15 DIAGNOSIS — M1990 Unspecified osteoarthritis, unspecified site: Secondary | ICD-10-CM | POA: Diagnosis not present

## 2015-03-16 DIAGNOSIS — M1990 Unspecified osteoarthritis, unspecified site: Secondary | ICD-10-CM | POA: Diagnosis not present

## 2015-03-16 DIAGNOSIS — S72002D Fracture of unspecified part of neck of left femur, subsequent encounter for closed fracture with routine healing: Secondary | ICD-10-CM | POA: Diagnosis not present

## 2015-03-16 DIAGNOSIS — F339 Major depressive disorder, recurrent, unspecified: Secondary | ICD-10-CM | POA: Diagnosis not present

## 2015-03-16 DIAGNOSIS — R1312 Dysphagia, oropharyngeal phase: Secondary | ICD-10-CM | POA: Diagnosis not present

## 2015-03-16 DIAGNOSIS — F0390 Unspecified dementia without behavioral disturbance: Secondary | ICD-10-CM | POA: Diagnosis not present

## 2015-03-16 DIAGNOSIS — E43 Unspecified severe protein-calorie malnutrition: Secondary | ICD-10-CM | POA: Diagnosis not present

## 2015-03-18 DIAGNOSIS — M1990 Unspecified osteoarthritis, unspecified site: Secondary | ICD-10-CM | POA: Diagnosis not present

## 2015-03-18 DIAGNOSIS — F339 Major depressive disorder, recurrent, unspecified: Secondary | ICD-10-CM | POA: Diagnosis not present

## 2015-03-18 DIAGNOSIS — E43 Unspecified severe protein-calorie malnutrition: Secondary | ICD-10-CM | POA: Diagnosis not present

## 2015-03-18 DIAGNOSIS — R1312 Dysphagia, oropharyngeal phase: Secondary | ICD-10-CM | POA: Diagnosis not present

## 2015-03-18 DIAGNOSIS — S72002D Fracture of unspecified part of neck of left femur, subsequent encounter for closed fracture with routine healing: Secondary | ICD-10-CM | POA: Diagnosis not present

## 2015-03-18 DIAGNOSIS — F0390 Unspecified dementia without behavioral disturbance: Secondary | ICD-10-CM | POA: Diagnosis not present

## 2015-03-19 DIAGNOSIS — S72002D Fracture of unspecified part of neck of left femur, subsequent encounter for closed fracture with routine healing: Secondary | ICD-10-CM | POA: Diagnosis not present

## 2015-03-19 DIAGNOSIS — E43 Unspecified severe protein-calorie malnutrition: Secondary | ICD-10-CM | POA: Diagnosis not present

## 2015-03-19 DIAGNOSIS — M1990 Unspecified osteoarthritis, unspecified site: Secondary | ICD-10-CM | POA: Diagnosis not present

## 2015-03-19 DIAGNOSIS — F339 Major depressive disorder, recurrent, unspecified: Secondary | ICD-10-CM | POA: Diagnosis not present

## 2015-03-19 DIAGNOSIS — F0390 Unspecified dementia without behavioral disturbance: Secondary | ICD-10-CM | POA: Diagnosis not present

## 2015-03-19 DIAGNOSIS — R1312 Dysphagia, oropharyngeal phase: Secondary | ICD-10-CM | POA: Diagnosis not present

## 2015-03-20 DIAGNOSIS — S72002D Fracture of unspecified part of neck of left femur, subsequent encounter for closed fracture with routine healing: Secondary | ICD-10-CM | POA: Diagnosis not present

## 2015-03-20 DIAGNOSIS — R1312 Dysphagia, oropharyngeal phase: Secondary | ICD-10-CM | POA: Diagnosis not present

## 2015-03-20 DIAGNOSIS — F0390 Unspecified dementia without behavioral disturbance: Secondary | ICD-10-CM | POA: Diagnosis not present

## 2015-03-20 DIAGNOSIS — M1990 Unspecified osteoarthritis, unspecified site: Secondary | ICD-10-CM | POA: Diagnosis not present

## 2015-03-20 DIAGNOSIS — F339 Major depressive disorder, recurrent, unspecified: Secondary | ICD-10-CM | POA: Diagnosis not present

## 2015-03-20 DIAGNOSIS — E43 Unspecified severe protein-calorie malnutrition: Secondary | ICD-10-CM | POA: Diagnosis not present

## 2015-03-21 DIAGNOSIS — M1990 Unspecified osteoarthritis, unspecified site: Secondary | ICD-10-CM | POA: Diagnosis not present

## 2015-03-21 DIAGNOSIS — S72002D Fracture of unspecified part of neck of left femur, subsequent encounter for closed fracture with routine healing: Secondary | ICD-10-CM | POA: Diagnosis not present

## 2015-03-21 DIAGNOSIS — E43 Unspecified severe protein-calorie malnutrition: Secondary | ICD-10-CM | POA: Diagnosis not present

## 2015-03-21 DIAGNOSIS — F0390 Unspecified dementia without behavioral disturbance: Secondary | ICD-10-CM | POA: Diagnosis not present

## 2015-03-21 DIAGNOSIS — F339 Major depressive disorder, recurrent, unspecified: Secondary | ICD-10-CM | POA: Diagnosis not present

## 2015-03-21 DIAGNOSIS — R1312 Dysphagia, oropharyngeal phase: Secondary | ICD-10-CM | POA: Diagnosis not present

## 2015-03-22 ENCOUNTER — Other Ambulatory Visit: Payer: Self-pay | Admitting: *Deleted

## 2015-03-22 DIAGNOSIS — F339 Major depressive disorder, recurrent, unspecified: Secondary | ICD-10-CM | POA: Diagnosis not present

## 2015-03-22 DIAGNOSIS — F0390 Unspecified dementia without behavioral disturbance: Secondary | ICD-10-CM | POA: Diagnosis not present

## 2015-03-22 DIAGNOSIS — E43 Unspecified severe protein-calorie malnutrition: Secondary | ICD-10-CM | POA: Diagnosis not present

## 2015-03-22 DIAGNOSIS — R1312 Dysphagia, oropharyngeal phase: Secondary | ICD-10-CM | POA: Diagnosis not present

## 2015-03-22 DIAGNOSIS — M1990 Unspecified osteoarthritis, unspecified site: Secondary | ICD-10-CM | POA: Diagnosis not present

## 2015-03-22 DIAGNOSIS — S72002D Fracture of unspecified part of neck of left femur, subsequent encounter for closed fracture with routine healing: Secondary | ICD-10-CM | POA: Diagnosis not present

## 2015-03-22 MED ORDER — FENTANYL 12 MCG/HR TD PT72: 12.5000 ug | MEDICATED_PATCH | TRANSDERMAL | Status: DC

## 2015-03-22 MED ORDER — HYDROCODONE-ACETAMINOPHEN 7.5-325 MG/15ML PO SOLN: ORAL | Status: DC

## 2015-03-22 NOTE — Telephone Encounter (Signed)
[facility] 

## 2015-03-26 DIAGNOSIS — M1990 Unspecified osteoarthritis, unspecified site: Secondary | ICD-10-CM | POA: Diagnosis not present

## 2015-03-26 DIAGNOSIS — S72002D Fracture of unspecified part of neck of left femur, subsequent encounter for closed fracture with routine healing: Secondary | ICD-10-CM | POA: Diagnosis not present

## 2015-03-26 DIAGNOSIS — F339 Major depressive disorder, recurrent, unspecified: Secondary | ICD-10-CM | POA: Diagnosis not present

## 2015-03-26 DIAGNOSIS — R1312 Dysphagia, oropharyngeal phase: Secondary | ICD-10-CM | POA: Diagnosis not present

## 2015-03-26 DIAGNOSIS — E43 Unspecified severe protein-calorie malnutrition: Secondary | ICD-10-CM | POA: Diagnosis not present

## 2015-03-26 DIAGNOSIS — F0390 Unspecified dementia without behavioral disturbance: Secondary | ICD-10-CM | POA: Diagnosis not present

## 2015-03-27 DIAGNOSIS — F0390 Unspecified dementia without behavioral disturbance: Secondary | ICD-10-CM | POA: Diagnosis not present

## 2015-03-27 DIAGNOSIS — S72002D Fracture of unspecified part of neck of left femur, subsequent encounter for closed fracture with routine healing: Secondary | ICD-10-CM | POA: Diagnosis not present

## 2015-03-27 DIAGNOSIS — R1312 Dysphagia, oropharyngeal phase: Secondary | ICD-10-CM | POA: Diagnosis not present

## 2015-03-27 DIAGNOSIS — M1990 Unspecified osteoarthritis, unspecified site: Secondary | ICD-10-CM | POA: Diagnosis not present

## 2015-03-27 DIAGNOSIS — E43 Unspecified severe protein-calorie malnutrition: Secondary | ICD-10-CM | POA: Diagnosis not present

## 2015-03-27 DIAGNOSIS — F339 Major depressive disorder, recurrent, unspecified: Secondary | ICD-10-CM | POA: Diagnosis not present

## 2015-04-03 ENCOUNTER — Other Ambulatory Visit: Payer: Self-pay | Admitting: *Deleted

## 2015-04-03 MED ORDER — HYDROCODONE-ACETAMINOPHEN 7.5-325 MG/15ML PO SOLN: ORAL | Status: DC

## 2015-04-03 NOTE — Telephone Encounter (Signed)
[facility] 

## 2015-04-11 ENCOUNTER — Other Ambulatory Visit: Payer: Self-pay

## 2015-04-11 MED ORDER — FENTANYL 12 MCG/HR TD PT72: 12.5000 ug | MEDICATED_PATCH | TRANSDERMAL | Status: DC

## 2015-04-11 NOTE — Telephone Encounter (Signed)
RX faxed to [facility] @ [phone]. Phone number [phone]  

## 2015-04-12 ENCOUNTER — Ambulatory Visit (HOSPITAL_COMMUNITY)
Admit: 2015-04-12 | Discharge: 2015-04-12 | Disposition: A | Discharge status: Home / Self Care | Payer: Medicare Other | Source: Skilled Nursing Facility | Attending: Internal Medicine | Admitting: Internal Medicine

## 2015-04-12 DIAGNOSIS — X58XXXD Exposure to other specified factors, subsequent encounter: Secondary | ICD-10-CM | POA: Diagnosis not present

## 2015-04-12 DIAGNOSIS — S72012K Unspecified intracapsular fracture of left femur, subsequent encounter for closed fracture with nonunion: Secondary | ICD-10-CM | POA: Insufficient documentation

## 2015-04-12 DIAGNOSIS — S72002D Fracture of unspecified part of neck of left femur, subsequent encounter for closed fracture with routine healing: Secondary | ICD-10-CM | POA: Diagnosis not present

## 2015-04-18 ENCOUNTER — Non-Acute Institutional Stay (SKILLED_NURSING_FACILITY): Payer: Medicare Other | Admitting: Internal Medicine

## 2015-04-18 ENCOUNTER — Encounter: Payer: Self-pay | Admitting: Internal Medicine

## 2015-04-18 ENCOUNTER — Other Ambulatory Visit: Payer: Self-pay | Admitting: *Deleted

## 2015-04-18 DIAGNOSIS — S72002G Fracture of unspecified part of neck of left femur, subsequent encounter for closed fracture with delayed healing: Secondary | ICD-10-CM

## 2015-04-18 DIAGNOSIS — F329 Major depressive disorder, single episode, unspecified: Secondary | ICD-10-CM | POA: Diagnosis not present

## 2015-04-18 DIAGNOSIS — F32A Depression, unspecified: Secondary | ICD-10-CM

## 2015-04-18 DIAGNOSIS — F0390 Unspecified dementia without behavioral disturbance: Secondary | ICD-10-CM

## 2015-04-18 DIAGNOSIS — I10 Essential (primary) hypertension: Secondary | ICD-10-CM

## 2015-04-18 MED ORDER — HYDROCODONE-ACETAMINOPHEN 7.5-325 MG/15ML PO SOLN: ORAL | Status: DC

## 2015-04-18 NOTE — Progress Notes (Signed)
Patient ID: [name], female   DOB: 02-Jul-1925, 81 y.o.   MRN: [id number]       Facility; [facility] This is a routine visit.  Chief complaint-medical management of chronic issues including nonoperative left hip fracture-previous history right hip fracture with repair-dementia-value thrive-   HPi-. This is a patient who was here in June 2015 at which point she had suffered a right hip fracture undergoing a bipolar hip replacement. It was noted at that time that she had advanced dementia, severe protein calorie malnutrition, gait ataxia with frequent falls, osteoporosis and was living at home with 24/7 sitters. She was eventually discharged to home. she has remained nonambulatory. She is bed to lift chair to wheelchair type existence. She still had 24/7 home sitters and a group of nieces and nephews look after her affairs.    she apparently tried to stand up from her wheelchair she fell she had left hip pain. X-ray on 02/23/15 showed a an acute sub-Left hip fracture It was decided to manage this nonoperatively with emphasis on comfort care in general.  Dr. Dellia Nims has address this and she has been started on Duragesic patch she is also on Vicodin 7. 5-325 milligrams every 6 hours when necessary this appears to be helping her medications otherwise are minimal she does have Ativan as needed for anxiety she also has Celexa for history of depression-she continues on Magic cup twice a day her weight appears to be relatively stable around 122 pounds.  Past Medical History  Diagnosis Date  . Dementia   . Osteoarthritis   .osteoperosis .protein calorie malnutrition hypertension   Past Surgical History  Procedure Laterality Date  . Breast surgery    . Eye surgery    . Hip arthroplasty Right 01/01/2014    Procedure: BIPOLAR ARTHROPLASTY RIGHT HIP;  Surgeon: Carole Civil, MD;  Location: [facility];  Service: Orthopedics;  Laterality: Right;    Current Outpatient Prescriptions on File Prior  to Visit  Medication Sig Dispense Refill  . acetaminophen (TYLENOL) 500 MG tablet Take 500-1,000 mg by mouth every 6 (six) hours as needed for moderate pain or headache.    Marland Kitchen alum & mag hydroxide-simeth (MAALOX/MYLANTA) 200-200-20 MG/5ML suspension Take 30 mLs by mouth every 4 (four) hours as needed for indigestion. 355 mL 0  . bisacodyl (DULCOLAX) 10 MG suppository Place 1 suppository (10 mg total) rectally daily as needed for moderate constipation or severe constipation. 12 suppository 0  . citalopram (CELEXA) 20 MG tablet Take 1 tablet by mouth daily.    Marland Kitchen HYDROcodone-acetaminophen (HYCET) 7.5-325 mg/15 ml solution Take 7.5 mLs by mouth 3 (three) times daily. 120 mL 0  . LORazepam (ATIVAN) 0.5 MG tablet Take 1 tablet (0.5 mg total) by mouth every 4 (four) hours as needed for anxiety. 20 tablet 0  . senna-docusate (SENOKOT-S) 8.6-50 MG per tablet Take 1 tablet by mouth at bedtime.      Social; patient was living at her own home and [place]. As noted she had around-the-clock sitters. She had deteriorated quite a bit from when the last time she was in this building and was essentially nonambulatory apparently using some form of Rollator walker. I think she was at dependent in all ADLs except perhaps feeding.  reports that she has never smoked. She does not have any smokeless tobacco history on file. She reports that she does not drink alcohol or use illicit drugs.  Family hx indicated that her mother is deceased. She indicated that her  father is deceased.   Review of Systems;  Patient is far too demented to [facility] participate.--Per nursing staff has a somewhat spotty appetite do not report any recent pain issues-she had been treated for conjunctivitis several weeks ago this appears to be resolved     Physical Exam:  Temperature 98.3 pulse 93 respirations 20 blood pressure 115/51 weight appears to be relatively stable in the 122.3 General: Patient is awake allert talking alert.  Her skin  is warm and dry.   Heent: Moist oral mucous membranes--oropharynx is clear-she has prescription lenses visual acuity appears grossly intact sclera and conjunctiva are clear Resp: clear a/e bilaterally CVS: HS Normal she is not dehydrated. No lower extremity edema ABD; No abd tenderess. No liver no spleen--positive bowel sounds GU foley cath in place amber colored urine. No suprapubioc no CVA tenderness MSK;  History of bilateral hip fractures-- left hip fracture nonoperative-she has her legs elevated she has general frailty but I do not note any acute deformity here or significant pain with palpation of the hip area-does move her extremities upper at baseline with baseline strength and range of motion.   Mental status; the patient is bright alert severe dementia  Labs.  . 02/23/2015.  Sodium 141 potassium 4 BUN 15 creatinine 0.8.  Albumin 3.4 otherwise liver function tests within normal limits.  WBC 4.5 hemoglobin 11.4 platelets 222  Impression/plan  #1 left hip fracture managed nonoperatively.  Emphasis is on pain management she continues on Duragesic patch 12.5 g-as well as Vicodin 7.5 mg every 6 hours when necessary at this point appears to be effective . 2 hypertension this actually appears to be stable despite being on no medications recent blood pressures 115/51-113/75 e. #3 severe dementia;  This appears to be stable she is under comfort care continue supportive measures--she continues on Celexa for con current depression  #4 history of protein calorie malnutrition,  Albumin was 3.4 on 02/23/2015.--She is on supplements-apparently her appetite is somewhat spotty eats fairly well at times and other times not so well her weight appears to be relatively stable   [id number]  .Marland Kitchen

## 2015-04-18 NOTE — Telephone Encounter (Signed)
[facility] 

## 2015-05-23 ENCOUNTER — Other Ambulatory Visit: Payer: Self-pay | Admitting: *Deleted

## 2015-05-23 MED ORDER — FENTANYL 12 MCG/HR TD PT72: MEDICATED_PATCH | TRANSDERMAL | Status: DC

## 2015-05-23 MED ORDER — HYDROCODONE-ACETAMINOPHEN 7.5-325 MG/15ML PO SOLN: ORAL | Status: DC

## 2015-05-23 NOTE — Telephone Encounter (Signed)
[facility] 

## 2015-06-06 DIAGNOSIS — F339 Major depressive disorder, recurrent, unspecified: Secondary | ICD-10-CM | POA: Diagnosis not present

## 2015-06-06 DIAGNOSIS — M810 Age-related osteoporosis without current pathological fracture: Secondary | ICD-10-CM | POA: Diagnosis not present

## 2015-06-06 DIAGNOSIS — S72002D Fracture of unspecified part of neck of left femur, subsequent encounter for closed fracture with routine healing: Secondary | ICD-10-CM | POA: Diagnosis not present

## 2015-06-06 DIAGNOSIS — M1990 Unspecified osteoarthritis, unspecified site: Secondary | ICD-10-CM | POA: Diagnosis not present

## 2015-06-06 DIAGNOSIS — E43 Unspecified severe protein-calorie malnutrition: Secondary | ICD-10-CM | POA: Diagnosis not present

## 2015-06-06 DIAGNOSIS — F0390 Unspecified dementia without behavioral disturbance: Secondary | ICD-10-CM | POA: Diagnosis not present

## 2015-06-06 DIAGNOSIS — R1312 Dysphagia, oropharyngeal phase: Secondary | ICD-10-CM | POA: Diagnosis not present

## 2015-06-07 DIAGNOSIS — S72002D Fracture of unspecified part of neck of left femur, subsequent encounter for closed fracture with routine healing: Secondary | ICD-10-CM | POA: Diagnosis not present

## 2015-06-07 DIAGNOSIS — R1312 Dysphagia, oropharyngeal phase: Secondary | ICD-10-CM | POA: Diagnosis not present

## 2015-06-07 DIAGNOSIS — M1990 Unspecified osteoarthritis, unspecified site: Secondary | ICD-10-CM | POA: Diagnosis not present

## 2015-06-07 DIAGNOSIS — F0390 Unspecified dementia without behavioral disturbance: Secondary | ICD-10-CM | POA: Diagnosis not present

## 2015-06-07 DIAGNOSIS — F339 Major depressive disorder, recurrent, unspecified: Secondary | ICD-10-CM | POA: Diagnosis not present

## 2015-06-07 DIAGNOSIS — E43 Unspecified severe protein-calorie malnutrition: Secondary | ICD-10-CM | POA: Diagnosis not present

## 2015-06-08 ENCOUNTER — Ambulatory Visit (HOSPITAL_COMMUNITY): Payer: Medicare Other | Attending: Internal Medicine

## 2015-06-08 DIAGNOSIS — S72012D Unspecified intracapsular fracture of left femur, subsequent encounter for closed fracture with routine healing: Secondary | ICD-10-CM | POA: Insufficient documentation

## 2015-06-08 DIAGNOSIS — M1990 Unspecified osteoarthritis, unspecified site: Secondary | ICD-10-CM | POA: Diagnosis not present

## 2015-06-08 DIAGNOSIS — F0390 Unspecified dementia without behavioral disturbance: Secondary | ICD-10-CM | POA: Diagnosis not present

## 2015-06-08 DIAGNOSIS — R1312 Dysphagia, oropharyngeal phase: Secondary | ICD-10-CM | POA: Diagnosis not present

## 2015-06-08 DIAGNOSIS — S72002D Fracture of unspecified part of neck of left femur, subsequent encounter for closed fracture with routine healing: Secondary | ICD-10-CM | POA: Diagnosis not present

## 2015-06-08 DIAGNOSIS — E43 Unspecified severe protein-calorie malnutrition: Secondary | ICD-10-CM | POA: Diagnosis not present

## 2015-06-08 DIAGNOSIS — S72092K Other fracture of head and neck of left femur, subsequent encounter for closed fracture with nonunion: Secondary | ICD-10-CM | POA: Diagnosis not present

## 2015-06-08 DIAGNOSIS — F339 Major depressive disorder, recurrent, unspecified: Secondary | ICD-10-CM | POA: Diagnosis not present

## 2015-06-11 DIAGNOSIS — M1990 Unspecified osteoarthritis, unspecified site: Secondary | ICD-10-CM | POA: Diagnosis not present

## 2015-06-11 DIAGNOSIS — S72002D Fracture of unspecified part of neck of left femur, subsequent encounter for closed fracture with routine healing: Secondary | ICD-10-CM | POA: Diagnosis not present

## 2015-06-11 DIAGNOSIS — R1312 Dysphagia, oropharyngeal phase: Secondary | ICD-10-CM | POA: Diagnosis not present

## 2015-06-11 DIAGNOSIS — E43 Unspecified severe protein-calorie malnutrition: Secondary | ICD-10-CM | POA: Diagnosis not present

## 2015-06-11 DIAGNOSIS — F339 Major depressive disorder, recurrent, unspecified: Secondary | ICD-10-CM | POA: Diagnosis not present

## 2015-06-11 DIAGNOSIS — F0390 Unspecified dementia without behavioral disturbance: Secondary | ICD-10-CM | POA: Diagnosis not present

## 2015-06-12 DIAGNOSIS — E43 Unspecified severe protein-calorie malnutrition: Secondary | ICD-10-CM | POA: Diagnosis not present

## 2015-06-12 DIAGNOSIS — F339 Major depressive disorder, recurrent, unspecified: Secondary | ICD-10-CM | POA: Diagnosis not present

## 2015-06-12 DIAGNOSIS — M1990 Unspecified osteoarthritis, unspecified site: Secondary | ICD-10-CM | POA: Diagnosis not present

## 2015-06-12 DIAGNOSIS — R1312 Dysphagia, oropharyngeal phase: Secondary | ICD-10-CM | POA: Diagnosis not present

## 2015-06-12 DIAGNOSIS — S72002D Fracture of unspecified part of neck of left femur, subsequent encounter for closed fracture with routine healing: Secondary | ICD-10-CM | POA: Diagnosis not present

## 2015-06-12 DIAGNOSIS — F0390 Unspecified dementia without behavioral disturbance: Secondary | ICD-10-CM | POA: Diagnosis not present

## 2015-06-13 DIAGNOSIS — E43 Unspecified severe protein-calorie malnutrition: Secondary | ICD-10-CM | POA: Diagnosis not present

## 2015-06-13 DIAGNOSIS — S72002D Fracture of unspecified part of neck of left femur, subsequent encounter for closed fracture with routine healing: Secondary | ICD-10-CM | POA: Diagnosis not present

## 2015-06-13 DIAGNOSIS — M1990 Unspecified osteoarthritis, unspecified site: Secondary | ICD-10-CM | POA: Diagnosis not present

## 2015-06-13 DIAGNOSIS — R1312 Dysphagia, oropharyngeal phase: Secondary | ICD-10-CM | POA: Diagnosis not present

## 2015-06-13 DIAGNOSIS — F339 Major depressive disorder, recurrent, unspecified: Secondary | ICD-10-CM | POA: Diagnosis not present

## 2015-06-13 DIAGNOSIS — F0390 Unspecified dementia without behavioral disturbance: Secondary | ICD-10-CM | POA: Diagnosis not present

## 2015-06-14 DIAGNOSIS — S72002D Fracture of unspecified part of neck of left femur, subsequent encounter for closed fracture with routine healing: Secondary | ICD-10-CM | POA: Diagnosis not present

## 2015-06-14 DIAGNOSIS — R1312 Dysphagia, oropharyngeal phase: Secondary | ICD-10-CM | POA: Diagnosis not present

## 2015-06-14 DIAGNOSIS — F0390 Unspecified dementia without behavioral disturbance: Secondary | ICD-10-CM | POA: Diagnosis not present

## 2015-06-14 DIAGNOSIS — E43 Unspecified severe protein-calorie malnutrition: Secondary | ICD-10-CM | POA: Diagnosis not present

## 2015-06-14 DIAGNOSIS — F339 Major depressive disorder, recurrent, unspecified: Secondary | ICD-10-CM | POA: Diagnosis not present

## 2015-06-14 DIAGNOSIS — M1990 Unspecified osteoarthritis, unspecified site: Secondary | ICD-10-CM | POA: Diagnosis not present

## 2015-06-15 DIAGNOSIS — F0390 Unspecified dementia without behavioral disturbance: Secondary | ICD-10-CM | POA: Diagnosis not present

## 2015-06-15 DIAGNOSIS — S72002D Fracture of unspecified part of neck of left femur, subsequent encounter for closed fracture with routine healing: Secondary | ICD-10-CM | POA: Diagnosis not present

## 2015-06-15 DIAGNOSIS — M1990 Unspecified osteoarthritis, unspecified site: Secondary | ICD-10-CM | POA: Diagnosis not present

## 2015-06-15 DIAGNOSIS — F339 Major depressive disorder, recurrent, unspecified: Secondary | ICD-10-CM | POA: Diagnosis not present

## 2015-06-15 DIAGNOSIS — E43 Unspecified severe protein-calorie malnutrition: Secondary | ICD-10-CM | POA: Diagnosis not present

## 2015-06-15 DIAGNOSIS — R1312 Dysphagia, oropharyngeal phase: Secondary | ICD-10-CM | POA: Diagnosis not present

## 2015-06-18 DIAGNOSIS — S72002D Fracture of unspecified part of neck of left femur, subsequent encounter for closed fracture with routine healing: Secondary | ICD-10-CM | POA: Diagnosis not present

## 2015-06-18 DIAGNOSIS — E43 Unspecified severe protein-calorie malnutrition: Secondary | ICD-10-CM | POA: Diagnosis not present

## 2015-06-18 DIAGNOSIS — F339 Major depressive disorder, recurrent, unspecified: Secondary | ICD-10-CM | POA: Diagnosis not present

## 2015-06-18 DIAGNOSIS — F0390 Unspecified dementia without behavioral disturbance: Secondary | ICD-10-CM | POA: Diagnosis not present

## 2015-06-18 DIAGNOSIS — M1990 Unspecified osteoarthritis, unspecified site: Secondary | ICD-10-CM | POA: Diagnosis not present

## 2015-06-18 DIAGNOSIS — R1312 Dysphagia, oropharyngeal phase: Secondary | ICD-10-CM | POA: Diagnosis not present

## 2015-06-19 DIAGNOSIS — F339 Major depressive disorder, recurrent, unspecified: Secondary | ICD-10-CM | POA: Diagnosis not present

## 2015-06-19 DIAGNOSIS — R1312 Dysphagia, oropharyngeal phase: Secondary | ICD-10-CM | POA: Diagnosis not present

## 2015-06-19 DIAGNOSIS — E43 Unspecified severe protein-calorie malnutrition: Secondary | ICD-10-CM | POA: Diagnosis not present

## 2015-06-19 DIAGNOSIS — M1990 Unspecified osteoarthritis, unspecified site: Secondary | ICD-10-CM | POA: Diagnosis not present

## 2015-06-19 DIAGNOSIS — S72002D Fracture of unspecified part of neck of left femur, subsequent encounter for closed fracture with routine healing: Secondary | ICD-10-CM | POA: Diagnosis not present

## 2015-06-19 DIAGNOSIS — F0390 Unspecified dementia without behavioral disturbance: Secondary | ICD-10-CM | POA: Diagnosis not present

## 2015-06-20 ENCOUNTER — Other Ambulatory Visit: Payer: Self-pay | Admitting: *Deleted

## 2015-06-20 MED ORDER — FENTANYL 12 MCG/HR TD PT72: MEDICATED_PATCH | TRANSDERMAL | Status: DC

## 2015-06-20 NOTE — Telephone Encounter (Signed)
[facility] 

## 2015-07-18 ENCOUNTER — Other Ambulatory Visit: Payer: Self-pay | Admitting: *Deleted

## 2015-07-18 MED ORDER — FENTANYL 12 MCG/HR TD PT72: 12.5000 ug | MEDICATED_PATCH | TRANSDERMAL | Status: DC

## 2015-07-24 ENCOUNTER — Other Ambulatory Visit: Payer: Self-pay | Admitting: *Deleted

## 2015-07-24 MED ORDER — FENTANYL 12 MCG/HR TD PT72: MEDICATED_PATCH | TRANSDERMAL | Status: DC

## 2015-07-24 NOTE — Telephone Encounter (Signed)
[facility] 

## 2015-08-08 ENCOUNTER — Non-Acute Institutional Stay (SKILLED_NURSING_FACILITY): Payer: Medicare Other | Admitting: Internal Medicine

## 2015-08-08 ENCOUNTER — Encounter: Payer: Self-pay | Admitting: Internal Medicine

## 2015-08-08 DIAGNOSIS — R627 Adult failure to thrive: Secondary | ICD-10-CM | POA: Diagnosis not present

## 2015-08-08 DIAGNOSIS — S72002G Fracture of unspecified part of neck of left femur, subsequent encounter for closed fracture with delayed healing: Secondary | ICD-10-CM | POA: Diagnosis not present

## 2015-08-08 DIAGNOSIS — I10 Essential (primary) hypertension: Secondary | ICD-10-CM

## 2015-08-08 DIAGNOSIS — F0390 Unspecified dementia without behavioral disturbance: Secondary | ICD-10-CM | POA: Diagnosis not present

## 2015-08-08 NOTE — Progress Notes (Signed)
Patient ID: [name], female   DOB: 1925/01/19, 82 y.o.   MRN: [id number]        Facility; [facility] This is a routine visit.  Chief complaint-medical management of chronic issues including nonoperative left hip fracture-previous history right hip fracture with repair-dementia-Failure to thrive-   HPi-. This is a patient who was here in June 2015 at which point she had suffered a right hip fracture undergoing a bipolar hip replacement. It was noted at that time that she had advanced dementia, severe protein calorie malnutrition, gait ataxia with frequent falls, osteoporosis and was living at home with 24/7 sitters. She was eventually discharged to home. she has remained nonambulatory. She is bed to lift chair to wheelchair type existence. She still had 24/7 home sitters and a group of nieces and nephews look after her affairs.    she apparently tried to stand up from her wheelchair she fell she had left hip pain. X-ray on 02/23/15 showed a an acute sub-Left hip fracture It was decided to manage this nonoperatively with emphasis on comfort care in general.  Dr. Dellia Nims has address this and she has been started on Duragesic patch she is also on Vicodin 7. 5-325 milligrams every 6 hours when necessary this appears to be helping her medications otherwise are minimal she does have Ativan as needed for anxiety she also has Celexa for history of depression-appears she had some weight loss but is gaining this back currently 115.6 which is up about 6 pounds over the past several weeks.  She is on dietary supplementation.  Nursing does not report any acute issues she has under comfort measures with orders for no rehospitalization no aggressive measures  Past Medical History  Diagnosis Date  . Dementia   . Osteoarthritis   .osteoperosis .protein calorie malnutrition hypertension   Past Surgical History  Procedure Laterality Date  . Breast surgery    . Eye surgery    . Hip arthroplasty  Right 01/01/2014    Procedure: BIPOLAR ARTHROPLASTY RIGHT HIP;  Surgeon: Carole Civil, MD;  Location: [facility];  Service: Orthopedics;  Laterality: Right;    Current Outpatient Prescriptions on File Prior to Visit  Medication Sig Dispense Refill  . acetaminophen (TYLENOL) 500 MG tablet Take 500-1,000 mg by mouth every 6 (six) hours as needed for moderate pain or headache.    Marland Kitchen alum & mag hydroxide-simeth (MAALOX/MYLANTA) 200-200-20 MG/5ML suspension Take 30 mLs by mouth every 4 (four) hours as needed for indigestion. 355 mL 0  . bisacodyl (DULCOLAX) 10 MG suppository Place 1 suppository (10 mg total) rectally daily as needed for moderate constipation or severe constipation. 12 suppository 0  . citalopram (CELEXA) 20 MG tablet Take 1 tablet by mouth daily.    Marland Kitchen HYDROcodone-acetaminophen (HYCET) 7.5-325 mg/15 ml solution Take 7.5 mLs by mouth 3 (three) times daily. 120 mL 0  . LORazepam (ATIVAN) 0.5 MG tablet Take 1 tablet (0.5 mg total) by mouth every 4 (four) hours as needed for anxiety. 20 tablet 0  . senna-docusate (SENOKOT-S) 8.6-50 MG per tablet Take 1 tablet by mouth at bedtime.      Social; patient was living at her own home and [place]. As noted she had around-the-clock sitters. She had deteriorated quite a bit from when the last time she was in this building and was essentially nonambulatory apparently using some form of Rollator walker. I think she was at dependent in all ADLs except perhaps feeding.  she has never smoked. She does  not have any smokeless tobacco history on file.  she does not drink alcohol or use illicit drugs.  Family hx indicated that her mother is deceased. She indicated that her father is deceased.   Review of Systems;  Patient is far too demented to [facility] participate.--Per nursing staff has a somewhat spotty appetite do not report any recent pain issues-she had been treated for conjunctivitis several weeks ago this appears to be resolved     Physical  Exam:   Temperature 98.8 pulse 79 respirations 18 blood pressure 100/56-156/60 in this range weight is 115.6 per she lost some weight but is now slowly gaining this back General: Patient is awake allert talking alert.  Her skin is warm and dry.   Heent: Moist oral mucous membranes--oropharynx is clear-she has prescription lenses visual acuity appears grossly intact sclera and conjunctiva are clear Resp: clear  bilaterallywith poor respiratory effort CVS: HS Normal she is not dehydrated. No lower extremity edema ABD; No abd tenderess. No liver no spleen--positive bowel sounds GU foley cath in place amber colored urine. No suprapubioc no CVA tenderness MSK;  History of bilateral hip fractures-- left hip fracture nonoperative-she has her legs elevated she has general frailty but I do not note any acute deformity here or significant pain with palpation of the hip area-does move her extremities upper at baseline with baseline strength and range of motion.   Mental status; the patient is bright alert severe dementia  Labs.  . 02/23/2015.  Sodium 141 potassium 4 BUN 15 creatinine 0.8.  Albumin 3.4 otherwise liver function tests within normal limits.  WBC 4.5 hemoglobin 11.4 platelets 222  Impression/plan  #1 left hip fracture managed nonoperatively.  Emphasis is on pain management she continues on Duragesic patch 12.5 g-as well as Vicodin 7.5 mg every 6 hours when necessary at this point appears to be effective . 2 hypertension this actually appears to be stable despite being on no medications recent blood pressures range from 100/56-156/60 do not see consistent elevations e. #3 severe dementia;  This appears to be stable she is under comfort care continue supportive measures--she continues on Celexa for con current depression  #4 history of protein calorie malnutrition,  Albumin was 3.4 on 02/23/2015.--Per nursing staff she is doing a bit better with her appetite her weight is  slowly trending up again which is encouraging    [id number]  .Marland Kitchen

## 2015-08-10 ENCOUNTER — Other Ambulatory Visit: Payer: Self-pay | Admitting: *Deleted

## 2015-08-10 MED ORDER — FENTANYL 12 MCG/HR TD PT72: MEDICATED_PATCH | TRANSDERMAL | Status: DC

## 2015-08-10 NOTE — Telephone Encounter (Signed)
[facility] 

## 2015-08-20 ENCOUNTER — Other Ambulatory Visit: Payer: Self-pay | Admitting: *Deleted

## 2015-08-20 MED ORDER — HYDROCODONE-ACETAMINOPHEN 7.5-325 MG/15ML PO SOLN: ORAL | Status: DC

## 2015-08-20 NOTE — Telephone Encounter (Signed)
[facility] 

## 2015-08-23 ENCOUNTER — Other Ambulatory Visit: Payer: Self-pay | Admitting: *Deleted

## 2015-08-23 MED ORDER — HYDROCODONE-ACETAMINOPHEN 7.5-325 MG/15ML PO SOLN: ORAL | Status: DC

## 2015-08-23 NOTE — Telephone Encounter (Signed)
[facility] 

## 2015-08-27 DIAGNOSIS — M79672 Pain in left foot: Secondary | ICD-10-CM | POA: Diagnosis not present

## 2015-08-27 DIAGNOSIS — M79671 Pain in right foot: Secondary | ICD-10-CM | POA: Diagnosis not present

## 2015-08-27 DIAGNOSIS — B351 Tinea unguium: Secondary | ICD-10-CM | POA: Diagnosis not present

## 2015-09-19 ENCOUNTER — Other Ambulatory Visit: Payer: Self-pay | Admitting: *Deleted

## 2015-09-19 MED ORDER — FENTANYL 12 MCG/HR TD PT72: MEDICATED_PATCH | TRANSDERMAL | Status: DC

## 2015-09-19 NOTE — Telephone Encounter (Signed)
[facility] 

## 2015-09-20 ENCOUNTER — Telehealth: Payer: Self-pay

## 2015-09-20 MED ORDER — LORAZEPAM 0.5 MG PO TABS: ORAL_TABLET | ORAL | Status: DC

## 2015-09-20 NOTE — Telephone Encounter (Signed)
Message forwarded due to unable to print rx's

## 2015-09-20 NOTE — Telephone Encounter (Signed)
Rx fax to [facility] [phone], Phone number [phone]  

## 2015-09-24 ENCOUNTER — Other Ambulatory Visit: Payer: Self-pay | Admitting: *Deleted

## 2015-09-24 MED ORDER — HYDROCODONE-ACETAMINOPHEN 7.5-325 MG PO TABS: 1.0000 | ORAL_TABLET | Freq: Four times a day (QID) | ORAL | Status: DC | PRN

## 2015-09-24 NOTE — Telephone Encounter (Signed)
[facility] 

## 2015-09-25 ENCOUNTER — Other Ambulatory Visit: Payer: Self-pay | Admitting: *Deleted

## 2015-09-25 MED ORDER — HYDROCODONE-ACETAMINOPHEN 7.5-325 MG/15ML PO SOLN: ORAL | Status: DC

## 2015-09-25 NOTE — Telephone Encounter (Signed)
[facility] 

## 2015-10-16 ENCOUNTER — Other Ambulatory Visit: Payer: Self-pay | Admitting: *Deleted

## 2015-10-16 MED ORDER — FENTANYL 12 MCG/HR TD PT72: MEDICATED_PATCH | TRANSDERMAL | Status: DC

## 2015-10-16 NOTE — Telephone Encounter (Signed)
[facility] 

## 2015-11-14 ENCOUNTER — Other Ambulatory Visit: Payer: Self-pay

## 2015-11-14 MED ORDER — FENTANYL 12 MCG/HR TD PT72: MEDICATED_PATCH | TRANSDERMAL | Status: DC

## 2015-11-14 NOTE — Telephone Encounter (Signed)
Rx faxed to [facility] at [phone].   Phone #: [phone]  

## 2015-11-30 IMAGING — DX DG HIP (WITH OR WITHOUT PELVIS) 2-3V*L*
3 series · 3 of 3 positions shown · non-contrast
Comparison: Pelvis and left hip series of February 23, 2015

CLINICAL DATA: Follow-up of a fracture February 2015

EXAM:
DG HIP (WITH OR WITHOUT PELVIS) 2-3V LEFT

[pelvis ap]
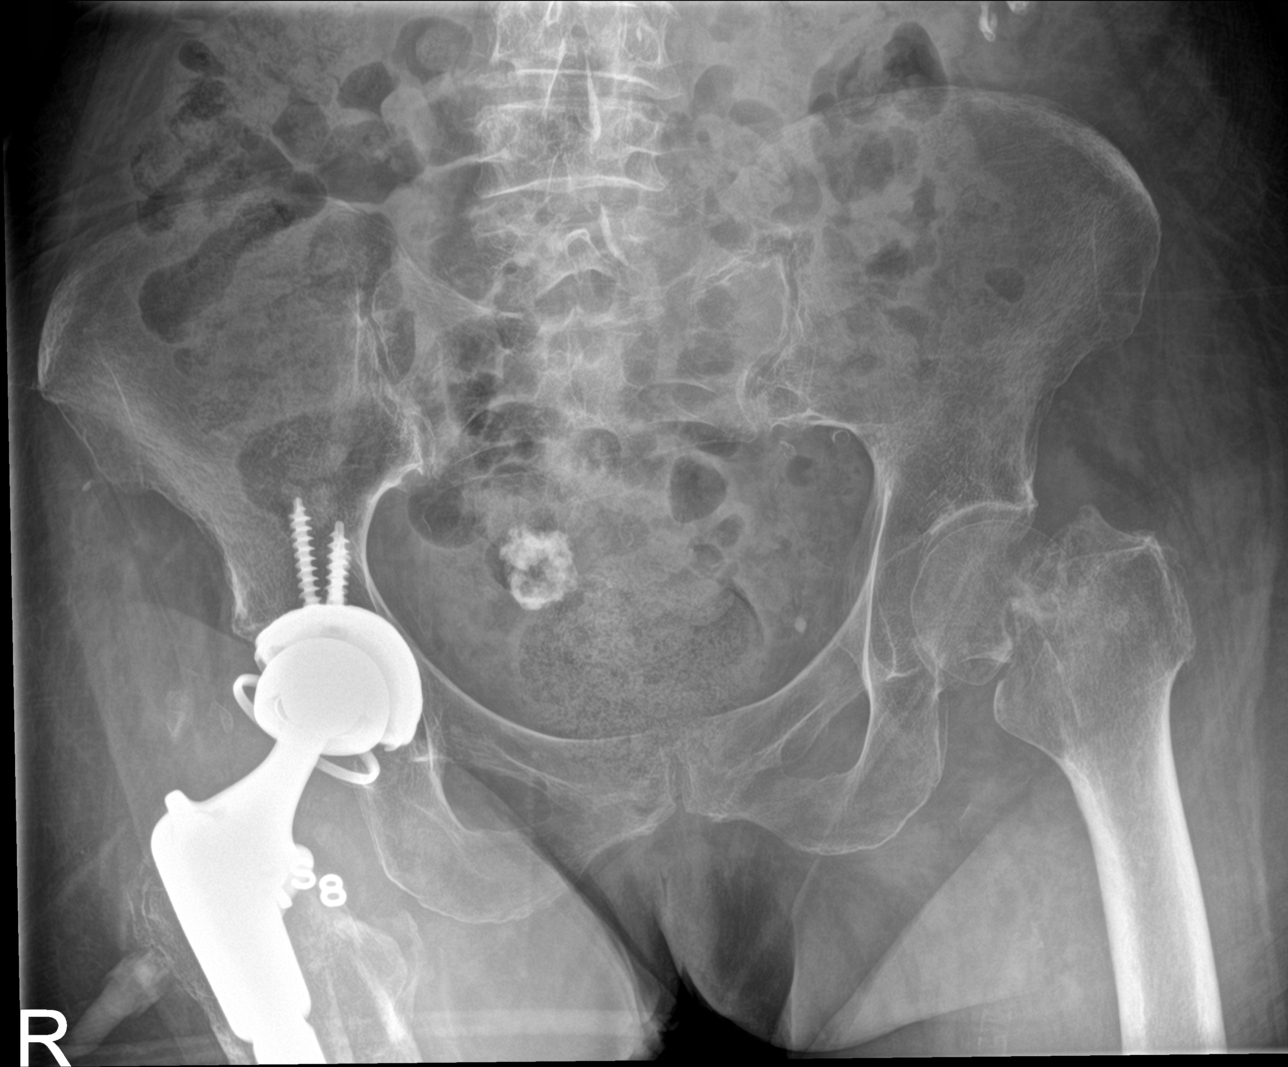

[hip ap]
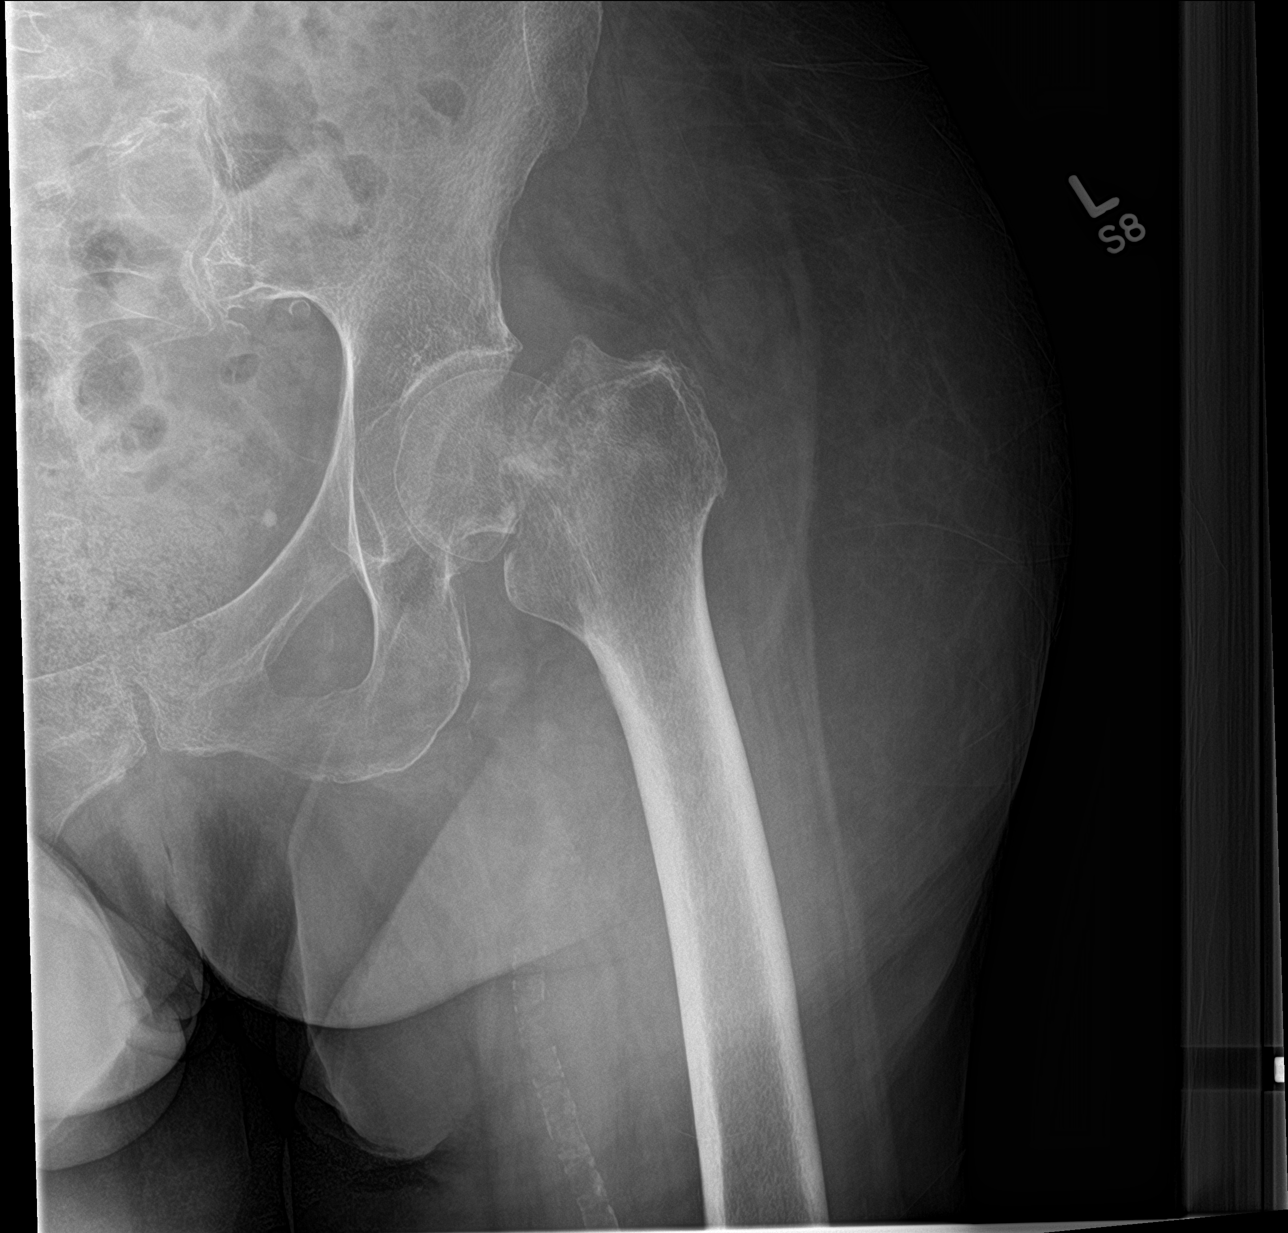

[hip lat]
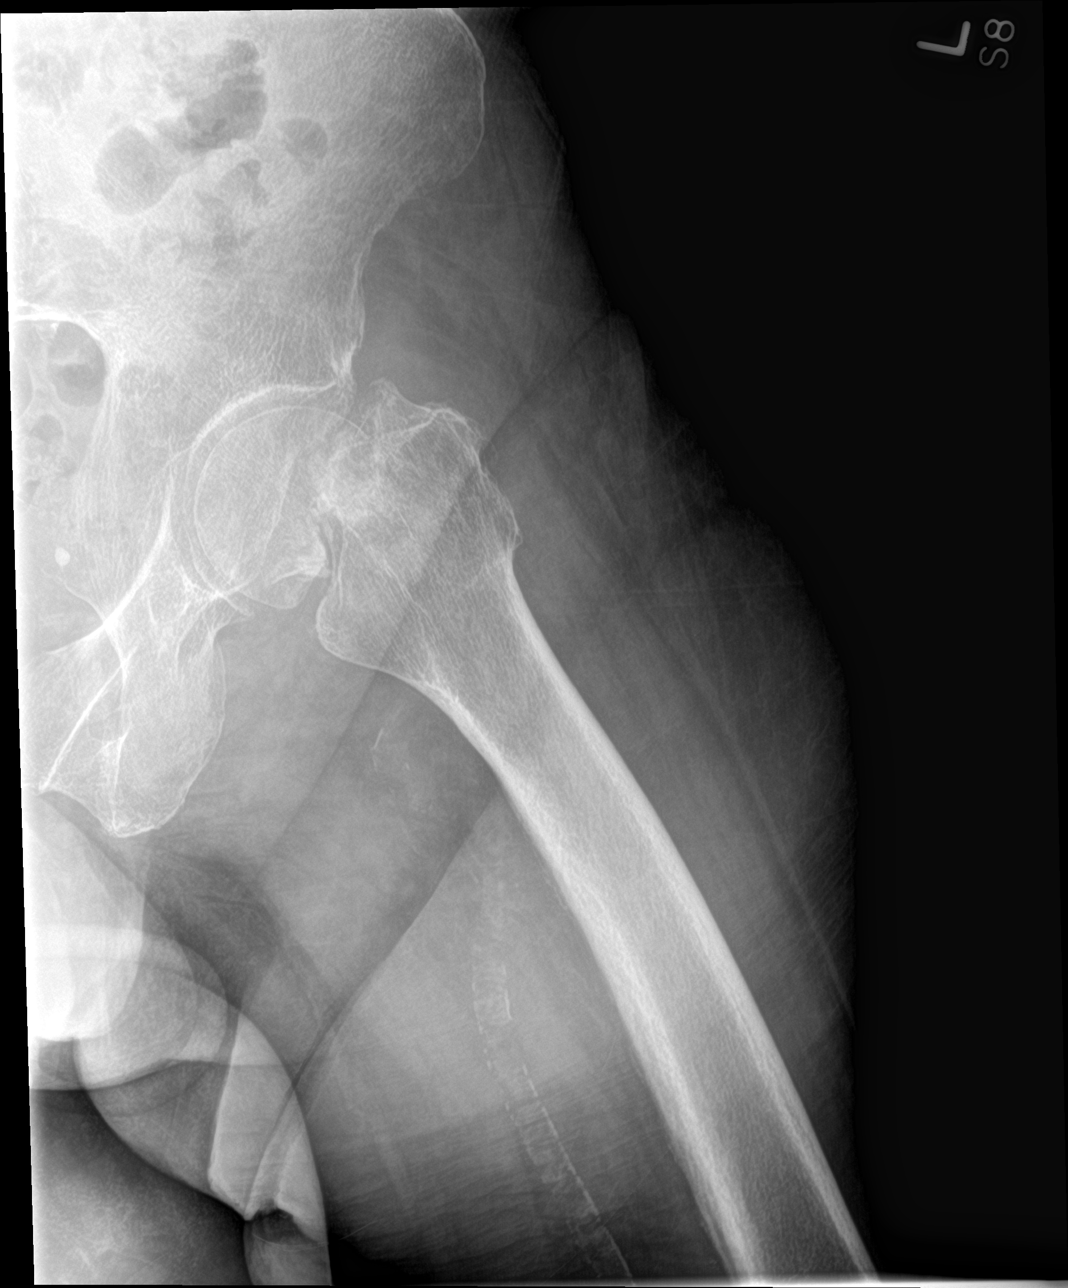

[3 of 3 positions shown; findings below may reference images not displayed]

FINDINGS: The subcapital fracture of the left hip is unchanged in appearance.
Mild superior migration of the femoral shaft has occurred. The bony
pelvis is osteopenic. There is a prosthetic right hip joint. A large
coarse calcification in the right aspect of the pelvis is compatible
with a fibroid.
IMPRESSION: Nonunion of the subcapital fracture of the left hip. There is mild
superior migration of the femoral shaft with respect to the femoral
head.

## 2015-12-06 ENCOUNTER — Non-Acute Institutional Stay (SKILLED_NURSING_FACILITY): Payer: Medicare Other | Admitting: Internal Medicine

## 2015-12-06 DIAGNOSIS — S72012G Unspecified intracapsular fracture of left femur, subsequent encounter for closed fracture with delayed healing: Secondary | ICD-10-CM

## 2015-12-06 DIAGNOSIS — F0391 Unspecified dementia with behavioral disturbance: Secondary | ICD-10-CM

## 2015-12-06 DIAGNOSIS — E46 Unspecified protein-calorie malnutrition: Secondary | ICD-10-CM

## 2015-12-06 NOTE — Progress Notes (Signed)
Location:  [place] of Service:  SNF (31)   Hilma Favors, Betsy Coder, MD  Patient Care Team: Sharilyn Sites, MD as PCP - General [facility])  Extended Emergency Contact Information Primary Emergency Contact: Noreene Larsson of [place] Phone: [phone] Mobile Phone: [phone] Relation: Niece Secondary Emergency Contact: Harvie Bridge [place] of [place] Phone: [phone] Mobile Phone: [phone] Relation: Sister   Goals of care: Advanced Directive information Advanced Directives 02/23/2015  Does patient have an advance directive? No  Would patient like information on creating an advanced directive? -     Chief Complaint  Patient presents with  . Medical Management of Chronic Issues  Including nonoperative left hip fracture-previous history right hip fracture with repair-dementia-failure to thrive  HPI:  Pt is a 82 y.o. female seen today for medical management of chronic diseases as noted above.  Back in June 2015 she signed sustained a right hip fracture underwent a bipolar hip replacement-as noted she had advanced dementia severe protein calorie malnutrition gait ataxia and osteoporosis.  She was eventually discharged home and remain nonambulatory.  She had 24 7 care.  Apparently she tried to stand up from her wheelchair fell and sustained a left hip fracture.  It was decided to manage this nonoperatively with emphasis on comfort care.  She has been started on a Duragesic patch she is also on vitamins 7.5-325 mg every 6 hours when necessary and this appears to be effective-she also has Ativan as needed for anxiety.  She continues on Celexa for a history of depression-.  Additionally she is gassy gained some weight from earlier this year weight now is around 120 pounds which appears to be a 5 pound weight gain since January.  Nursing does not report any acute issues recently apparently she is eating and  drinking fairly well-she is under comfort measures orders for no rehospitalization no aggressive measures.  .     Past Medical History  Diagnosis Date  . Dementia   . Osteoarthritis    Past Surgical History  Procedure Laterality Date  . Breast surgery    . Eye surgery    . Hip arthroplasty Right 01/01/2014    Procedure: BIPOLAR ARTHROPLASTY RIGHT HIP;  Surgeon: Carole Civil, MD;  Location: [facility];  Service: Orthopedics;  Laterality: Right;    Allergies  Allergen Reactions  . Morphine And Related     Increased confusion    Current Outpatient Prescriptions on File Prior to Visit  Medication Sig Dispense Refill  . acetaminophen (TYLENOL) 500 MG tablet Take 500-1,000 mg by mouth every 6 (six) hours as needed for moderate pain or headache.    Marland Kitchen alum & mag hydroxide-simeth (MAALOX/MYLANTA) 200-200-20 MG/5ML suspension Take 30 mLs by mouth every 4 (four) hours as needed for indigestion. 355 mL 0  . bisacodyl (DULCOLAX) 10 MG suppository Place 1 suppository (10 mg total) rectally daily as needed for moderate constipation or severe constipation. 12 suppository 0  . fentaNYL (DURAGESIC - DOSED MCG/HR) 12 MCG/HR Apply one patch topically as directed every 72 hours (every 3 days) for pain 10 patch 0  . HYDROcodone-acetaminophen (HYCET) 7.5-325 mg/15 ml solution Give 15ml by mouth every 6 hours as needed for pain. Max APAP 3gm/24 hours from all sources 240 mL 0  . LORazepam (ATIVAN) 0.5 MG tablet Take 1/2 (0.25 mg) by mouth every 4 hours as needed for anxiety 90 tablet 5  . senna-docusate (SENOKOT-S) 8.6-50 MG per tablet Take 1 tablet  by mouth at bedtime.     No current facility-administered medications on file prior to visit.     Review of Systems  Please see history of present illness essentially unattainable secondary to dementia  Immunization History  Administered Date(s) Administered  . Influenza-Unspecified 05/10/2014  . Pneumococcal Polysaccharide-23 01/02/2014  . Tdap  01/01/2014   Pertinent  Health Maintenance Due  Topic Date Due  . PNA vac Low Risk Adult (2 of 2 - PCV13) 01/03/2015  . INFLUENZA VACCINE  03/04/2016  . DEXA SCAN  Completed   No flowsheet data found. Functional Status Survey:    Filed Vitals:   12/06/15 1511  BP: 143/81  Pulse: 82  Temp: 98.4 F (36.9 C)  TempSrc: Oral  Resp: 18  Weight: 124 lb 3.2 oz (56.337 kg)   Body mass index is 22.01 kg/(m^2). Physical Exam   In general this is a frail elderly female in no distress she appears to be bright and alert today lying in bed no sign of distress here.  Her skin is warm and dry there is well-healed surgical scar right hip.  Oropharynx appears clear mucous membranes moist.  Eyes appear reactive to light visual acuity appears grossly intact.  Chest is clear to auscultation there is no labored A breathing effort is somewhat poor.  Heart is regular rate and rhythm without murmur gallop or rub she does not have significant lower extremity edema.  Abdomen soft nontender positive bowel sounds.  GU has a catheter in place draining amber colored urine cannot really appreciate any suprapubic tenderness.  Skeletal skeletal again history of bilateral hip fractures I do not note any acute deformities here she has general frailty with significant weakness of her lower extremities bilaterally there is again is a well-healed surgical scar on the right hip.  Neurologic is grossly intact her speech is clear somewhat limited exam since patient is in bed I could not see any lateralizing findings.  Psych she is oriented to self did follow simple verbal commands at times she was somewhat agitated with exam but  the agitation was quite mild.    Labs reviewed:  Recent Labs  02/23/15 1621  NA 141  K 4.0  CL 106  CO2 27  GLUCOSE 132*  BUN 15  CREATININE 0.80  CALCIUM 9.0    Recent Labs  02/23/15 1621  AST 25  ALT 18  ALKPHOS 51  BILITOT 0.3  PROT 6.6  ALBUMIN 3.4*     Recent Labs  02/23/15 1621  WBC 4.5  NEUTROABS 2.7  HGB 11.4*  HCT 35.3*  MCV 97.8  PLT 222   Lab Results  Component Value Date   TSH 2.770 11/29/2012         Assessment/Plan  #1-left hip fracture managed nonoperatively-emphasis again is on pain management she is on Duragesic patch and Vicodin as necessary this appears to be working at this point monitor.  #2 hypertension this appears to be stable on no medications recent blood pressures 143/81-131/77-106/56 I do not see consistent elevations.  #3 history severe dementia this appears stable with supportive care she is on Celexa for depression as well as Ativan as needed for anxiety per nursing this has not been a real big issue recently.  #4-history of protein calorie malnutrition her weight also actually appears to be trending up she skin about 5 pounds since the beginning of the year albumin was 3.4 in July 2016.  .  Again patient is under comfort care with minimal invasive  procedures clinically she appears to be stable at this point will monitor.  [id number]

## 2015-12-06 NOTE — Progress Notes (Signed)
Patient ID: [name], female   DOB: 1925-08-02, 82 y.o.   MRN: [id number]

## 2015-12-10 ENCOUNTER — Other Ambulatory Visit: Payer: Self-pay | Admitting: *Deleted

## 2015-12-10 MED ORDER — FENTANYL 12 MCG/HR TD PT72: MEDICATED_PATCH | TRANSDERMAL | Status: DC

## 2015-12-10 NOTE — Telephone Encounter (Signed)
[facility]  

## 2016-01-03 ENCOUNTER — Other Ambulatory Visit: Payer: Self-pay

## 2016-01-03 MED ORDER — FENTANYL 12 MCG/HR TD PT72: MEDICATED_PATCH | TRANSDERMAL | Status: DC

## 2016-01-03 NOTE — Telephone Encounter (Signed)
Rx faxed to [facility] at [phone].   [address] [place], [place] [address]  Phone #: [phone] 

## 2016-01-14 ENCOUNTER — Non-Acute Institutional Stay (SKILLED_NURSING_FACILITY): Payer: Medicare Other | Admitting: Internal Medicine

## 2016-01-14 ENCOUNTER — Encounter: Payer: Self-pay | Admitting: Internal Medicine

## 2016-01-14 DIAGNOSIS — F0391 Unspecified dementia with behavioral disturbance: Secondary | ICD-10-CM

## 2016-01-14 DIAGNOSIS — S72012G Unspecified intracapsular fracture of left femur, subsequent encounter for closed fracture with delayed healing: Secondary | ICD-10-CM

## 2016-01-14 NOTE — Patient Instructions (Signed)
No change in medications is  indicated

## 2016-01-14 NOTE — Assessment & Plan Note (Signed)
She is not on Namenda or Aricept; I would see little benefit in starting those at this point Low-dose citalopram will be continued

## 2016-01-14 NOTE — Progress Notes (Signed)
Patient ID: [name], female   DOB: 12-11-1924, 82 y.o.   MRN: [id number]   This is a nursing facility follow up of chronic pain related to hip fracture . Following the right hip arthroplasty 01/01/14 she had 24/7 care at home but susstained a left hip fracture when she stood up from her wheelchair without help. Nonoperative comfort care was recommended. She has been on a Fentanyl patch for pain control.   Interim medical record and care since last [facility] visit was updated with review of diagnostic studies and change in clinical status since last visit were documented.   HPI: According to her nurse the present patch appears to provide adequate pain control. She has a liquid narcotic pain medication ordered for breakthrough pain. She does complain of pain when moved intermittently. She also has some agitation at night for which she has lorazepam ordered. She has been evaluated by palliative care and comfort care is the goal  She has had no labs since July 2016.  Review of systems could not be completed due to her severe dementia. Responses are not related to the questions and slurred and almost undiscernible.  Physical exam:  Pertinent or positive findings: She was asleep when I entered the room and was slightly difficult to arouse. Mental status and speech are described above. She is very pale. She did not follow simple commands such as opening her mouth for intraoral exam.  General appearance: Thin but adequately  nourished; no acute distress , increased work of breathing is present.   Lymphatic: No lymphadenopathy about the head, neck, axilla . Eyes: No conjunctival inflammation or lid edema is present. There is no scleral icterus. Ears:  External ear exam shows no significant lesions or deformities.   Nose:  External nasal examination shows no deformity or inflammation. Nasal mucosa are pink and moist without lesions ,exudates Oral exam: lips and gums are healthy  appearing. Neck:  No thyromegaly, masses, tenderness noted.    Heart:  Normal rate and regular rhythm. S1 and S2 normal without gallop, murmur, click, rub .  Lungs:Chest clear to auscultation without wheezes, rhonchi,rales , rubs. Abdomen:Bowel sounds are normal. Abdomen is soft and nontender with no organomegaly, hernias,masses. GU: deferred  Extremities:  No cyanosis, clubbing,edema  Neurologic exam : Strength equal  in upper & lower extremities could not be tested Balance,Rhomberg,finger to nose testing could not be completed due to clinical state Skin: Warm & dry w/o tenting. No significant lesions or rash.    See summary under each active problem in the Problem List with associated updated therapeutic plan

## 2016-01-14 NOTE — Assessment & Plan Note (Signed)
Pain relief is adequate with the present regimen

## 2016-02-11 DIAGNOSIS — M79672 Pain in left foot: Secondary | ICD-10-CM | POA: Diagnosis not present

## 2016-02-11 DIAGNOSIS — M79671 Pain in right foot: Secondary | ICD-10-CM | POA: Diagnosis not present

## 2016-02-11 DIAGNOSIS — B351 Tinea unguium: Secondary | ICD-10-CM | POA: Diagnosis not present

## 2016-02-14 ENCOUNTER — Non-Acute Institutional Stay (SKILLED_NURSING_FACILITY): Payer: Medicare Other | Admitting: Internal Medicine

## 2016-02-14 ENCOUNTER — Encounter: Payer: Self-pay | Admitting: Internal Medicine

## 2016-02-14 DIAGNOSIS — F0391 Unspecified dementia with behavioral disturbance: Secondary | ICD-10-CM

## 2016-02-14 DIAGNOSIS — T148 Other injury of unspecified body region: Secondary | ICD-10-CM | POA: Diagnosis not present

## 2016-02-14 DIAGNOSIS — M810 Age-related osteoporosis without current pathological fracture: Secondary | ICD-10-CM | POA: Diagnosis not present

## 2016-02-14 DIAGNOSIS — I10 Essential (primary) hypertension: Secondary | ICD-10-CM

## 2016-02-14 DIAGNOSIS — T148XXA Other injury of unspecified body region, initial encounter: Secondary | ICD-10-CM | POA: Insufficient documentation

## 2016-02-14 NOTE — Progress Notes (Signed)
   This is a nursing facility follow up of chronic medical diagnoses  Interim medical record and care since last [facility] visit was updated with review of diagnostic studies and change in clinical status since last visit were documented.  HPI: Medical diagnoses include dementia,hypertension,senile osteoporosis, leukocytosis, and thrombocytopenia. She did have acute blood loss anemia.  In the context of senile osteoporosis she's had  femoral neck bilaterally. She has no current labs. Last results were 02/23/15. At that time white count was normal as well as platelet count. She had very mild anemia with hemoglobin 11.4 and hematocrit 35.3. Glucose was mildly elevated at 132; she has no A1c on record.  Review of systems could not be completed because of her profound dementia  Physical exam:  Pertinent or positive findings: She appears frail and pallorous. Her replies are whispered and have no relationship to the question. She follows commands poorly as when asked to open her mouth for exam . Posterior tibial pulses are decreased. Scattered bruises noted over the forearms. General appearance:Adequately nourished; no acute distress , increased work of breathing is present.   Lymphatic: No lymphadenopathy about the head, neck, axilla . Eyes: No conjunctival inflammation or lid edema is present. There is no scleral icterus. Ears:  External ear exam shows no significant lesions or deformities.   Nose:  External nasal examination shows no deformity or inflammation. Nasal mucosa are pink and moist without lesions ,exudates Oral exam: lips and gums are healthy appearing.There is no oropharyngeal erythema or exudate . Neck:  No thyromegaly, masses, tenderness noted.    Heart:  Normal rate and regular rhythm. S1 and S2 normal without gallop, murmur, click, rub .  Lungs:Chest clear to auscultation without wheezes, rhonchi,rales , rubs. Abdomen:Bowel sounds are normal. Abdomen is soft and  nontender with no organomegaly, hernias,masses. Extremities:  No cyanosis, clubbing,edema  Neurologic exam : StCould not be tested Balance,Rhomberg,finger to nose testing could not be completed due to clinical state Skin: Warm & dry w/o tenting. No significant lesions or rash.    See summary under each active problem in the Problem List with associated updated therapeutic plan

## 2016-02-14 NOTE — Patient Instructions (Signed)
New orders for Matrix entry. CBC and differential  BMET  TSH  

## 2016-02-14 NOTE — Assessment & Plan Note (Signed)
TSH 

## 2016-02-14 NOTE — Assessment & Plan Note (Addendum)
CBC because of bruising and pallor

## 2016-02-14 NOTE — Assessment & Plan Note (Signed)
Severe dementia manifested by her shown on verbal state and difficulty following  simple commands Aricept or Namenda would be of no benefit with this degree of dementia and her advanced age Check TSH

## 2016-02-14 NOTE — Assessment & Plan Note (Signed)
Blood pressure is normal at this time without antihypertensive medications BMET will be checked as a baseline

## 2016-02-15 ENCOUNTER — Encounter (HOSPITAL_COMMUNITY)
Admission: RE | Admit: 2016-02-15 | Discharge: 2016-02-15 | Disposition: A | Discharge status: Home / Self Care | Payer: Medicare Other | Source: Skilled Nursing Facility | Attending: Internal Medicine | Admitting: Internal Medicine

## 2016-02-15 DIAGNOSIS — R1312 Dysphagia, oropharyngeal phase: Secondary | ICD-10-CM | POA: Diagnosis not present

## 2016-02-15 DIAGNOSIS — F411 Generalized anxiety disorder: Secondary | ICD-10-CM | POA: Insufficient documentation

## 2016-02-15 DIAGNOSIS — I10 Essential (primary) hypertension: Secondary | ICD-10-CM | POA: Insufficient documentation

## 2016-02-15 LAB — BASIC METABOLIC PANEL
Anion gap: 8 (ref 5–15)
BUN: 23 mg/dL — ABNORMAL HIGH (ref 6–20)
CALCIUM: 9.2 mg/dL (ref 8.9–10.3)
CO2: 26 mmol/L (ref 22–32)
CREATININE: 0.61 mg/dL (ref 0.44–1.00)
Chloride: 105 mmol/L (ref 101–111)
GLUCOSE: 92 mg/dL (ref 65–99)
Potassium: 4.1 mmol/L (ref 3.5–5.1)
Sodium: 139 mmol/L (ref 135–145)

## 2016-02-15 LAB — CBC
HCT: 39.5 % (ref 36.0–46.0)
Hemoglobin: 12.7 g/dL (ref 12.0–15.0)
MCH: 30.3 pg (ref 26.0–34.0)
MCHC: 32.2 g/dL (ref 30.0–36.0)
MCV: 94.3 fL (ref 78.0–100.0)
PLATELETS: 203 10*3/uL (ref 150–400)
RBC: 4.19 MIL/uL (ref 3.87–5.11)
RDW: 12.8 % (ref 11.5–15.5)
WBC: 4.7 10*3/uL (ref 4.0–10.5)

## 2016-02-15 LAB — TSH: TSH: 5.138 u[IU]/mL — AB (ref 0.350–4.500)

## 2016-03-26 DIAGNOSIS — F329 Major depressive disorder, single episode, unspecified: Secondary | ICD-10-CM | POA: Diagnosis not present

## 2016-03-26 DIAGNOSIS — F419 Anxiety disorder, unspecified: Secondary | ICD-10-CM | POA: Diagnosis not present

## 2016-03-26 DIAGNOSIS — F0390 Unspecified dementia without behavioral disturbance: Secondary | ICD-10-CM | POA: Diagnosis not present

## 2016-04-02 ENCOUNTER — Non-Acute Institutional Stay (SKILLED_NURSING_FACILITY): Payer: Medicare Other | Admitting: Internal Medicine

## 2016-04-02 ENCOUNTER — Encounter: Payer: Self-pay | Admitting: Internal Medicine

## 2016-04-02 DIAGNOSIS — S72002G Fracture of unspecified part of neck of left femur, subsequent encounter for closed fracture with delayed healing: Secondary | ICD-10-CM | POA: Diagnosis not present

## 2016-04-02 DIAGNOSIS — F0391 Unspecified dementia with behavioral disturbance: Secondary | ICD-10-CM | POA: Diagnosis not present

## 2016-04-02 DIAGNOSIS — I10 Essential (primary) hypertension: Secondary | ICD-10-CM | POA: Diagnosis not present

## 2016-04-02 DIAGNOSIS — D696 Thrombocytopenia, unspecified: Secondary | ICD-10-CM | POA: Diagnosis not present

## 2016-04-02 NOTE — Progress Notes (Signed)
Location:   [facility] Room Number: 136/P Place of Service:  SNF [phone]) Provider:  Leeanne Deed, MD  Patient Care Team: Hendricks Limes, MD as PCP - General (Internal Medicine)  Extended Emergency Contact Information Primary Emergency Contact: Noreene Larsson of [place] Phone: [phone] Mobile Phone: [phone] Relation: Niece Secondary Emergency Contact: Harvie Bridge States of [place] Phone: [phone] Mobile Phone: [phone] Relation: Sister  Code Status:  DNR Goals of care: Advanced Directive information Advanced Directives 04/02/2016  Does patient have an advance directive? Yes  Type of Advance Directive Out of facility DNR (pink MOST or yellow form)  Does patient want to make changes to advanced directive? No - Patient declined  Copy of advanced directive(s) in chart? Yes  Would patient like information on creating an advanced directive? -  Pre-existing out of facility DNR order (yellow form or pink MOST form) -     Chief Complaint  Patient presents with  . Medical Management of Chronic Issues    Routine Visit   For medical management of hypothyroidism-dementia-thrombocytopenia-chronic pain-she is his hypertension. History bilateral hip fractures   HPI:  Pt is a 82 y.o. female seen today for medical management of chronic diseases. As noted above.  She actually appears to have achieved a  period. If stability here.  She has in the past had some failure to thrive issues but she appears to be more alert and interactive today appears to be doing relatively well.  She has a history of anemia but this actually appears improved at 12.7 on lab done on 02/15/2016.  Her renal function looks stable with a creatinine of 0.61 BUN of 23 on the same date as well.  She did have an mildly elevated TSH of 5.138-and she is now on Synthroid. She has a history of bilateral hip fractures on the right was  surgically repaired-subsequent fracture on the left however is managed conservatively In regards to her pain appears to be controlled currently she has a Duragesic patch as well as Vicodin as needed.  Regards to her significant dementia she continues on Ativan as needed but  appears to be doing fairly well with supportive care       Past Medical History:  Diagnosis Date  . Dementia   . Osteoarthritis    Past Surgical History:  Procedure Laterality Date  . BREAST SURGERY    . EYE SURGERY    . HIP ARTHROPLASTY Right 01/01/2014   Procedure: BIPOLAR ARTHROPLASTY RIGHT HIP;  Surgeon: Carole Civil, MD;  Location: [facility];  Service: Orthopedics;  Laterality: Right;    Allergies  Allergen Reactions  . Morphine And Related     Increased confusion    Current Outpatient Prescriptions on File Prior to Visit  Medication Sig Dispense Refill  . acetaminophen (TYLENOL) 500 MG tablet Take one to two tablets by mouth every 6 hours as needed for mild pain. DNE 3000mg  Tylenol in 24 hours from all sources.    . AMBULATORY NON FORMULARY MEDICATION Magic Cup By mouth twice daily with Lunch and Dinner    . citalopram (CELEXA) 10 MG tablet Take 20 mg by mouth daily.     . fentaNYL (DURAGESIC - DOSED MCG/HR) 12 MCG/HR Apply one patch topically as directed every 72 hours (every 3 days) for pain 10 patch 0  . HYDROcodone-acetaminophen (HYCET) 7.5-325 mg/15 ml solution Give 44ml by mouth every 6 hours as needed for pain. Max APAP 3gm/24  hours from all sources 240 mL 0  . LORazepam (ATIVAN) 0.5 MG tablet Take 1/2 (0.25 mg) by mouth every 4 hours as needed for anxiety 90 tablet 5  . Nutritional Supplements (ENSURE ENLIVE PO) One by mouth daily    . senna-docusate (SENOKOT-S) 8.6-50 MG per tablet Take 1 tablet by mouth at bedtime.     No current facility-administered medications on file prior to visit.     Review of Systems   Is essentially unattainable secondary to dementia please see history of  present illness  Immunization History  Administered Date(s) Administered  . Influenza-Unspecified 05/10/2014  . Pneumococcal Polysaccharide-23 01/02/2014  . Tdap 01/01/2014   Pertinent  Health Maintenance Due  Topic Date Due  . PNA vac Low Risk Adult (2 of 2 - PCV13) 01/03/2015  . INFLUENZA VACCINE  07/04/2016 (Originally 03/04/2016)  . DEXA SCAN  Completed   No flowsheet data found. Functional Status Survey:    Vitals:   04/02/16 1437  BP: 129/63  Pulse: 85  Resp: 20  Temp: 98 F (36.7 C)  TempSrc: Oral  Weight: 127 lb 6.4 oz (57.8 kg)   Body mass index is 22.57 kg/m. Physical Exam   In general this is a frail elderly female in no distress she appears to be bright and alert today lying no sign of distress here.  Her skin is warm and dry there is well-healed surgical scar right hip. Continues to have somewhat scattered bruising but this does not appear to be greater than baseline  Oropharynx appears clear mucous membranes moist.  Eyes appear reactive to light visual acuity appears grossly intact.  Chest is clear to auscultation there is no labored A breathing effort is somewhat poor.  Heart is regular rate and rhythm without murmur gallop or rub she does not have significant lower extremity edema.  Abdomen soft nontender positive bowel sounds.  .  Muscle skeletal again history of bilateral hip fractures I do not note any acute deformities here she has general frailty with significant weakness of her lower extremities bilaterally there is again is a well-healed surgical scar on the right hip.  Neurologic is grossly intact her speech is clear somewhat limited exam since patient is lying in [place] chair  Psych she is oriented to self did follow simple verbal commands was pleasant continues to have difficulty following verbal commands  Labs reviewed:  Recent Labs  02/15/16 0819  NA 139  K 4.1  CL 105  CO2 26  GLUCOSE 92  BUN 23*  CREATININE 0.61    CALCIUM 9.2   No results for input(s): AST, ALT, ALKPHOS, BILITOT, PROT, ALBUMIN in the last 8760 hours.  Recent Labs  02/15/16 0819  WBC 4.7  HGB 12.7  HCT 39.5  MCV 94.3  PLT 203   Lab Results  Component Value Date   TSH 5.138 (H) 02/15/2016   No results found for: HGBA1C No results found for: CHOL, HDL, LDLCALC, LDLDIRECT, TRIG, CHOLHDL  Significant Diagnostic Results in last 30 days:  No results found.  Assessment/Plan  #1 history of bilateral hip fractures-again a poor candidate for aggressive treatment and left hip fracture has been treated nonoperatively-pain appears to be controlled with Duragesic patch as well as Vicodin as needed she appears to be comfortable today at this point monitor.  #2 dementia this appears to be quite severe suspect with the severity she would not really benefit from Aricept or Namenda-has Ativan as needed for agitation but apparently this has not needed  much was pleasant and cooperative today.  #3 thrombocytopenia this appears stable with platelets of 203,000 on 02/15/2016  #4 failure to thrive she appears to have gained about 7 pounds since this spring which is encouraging she continues to take supplements apparently does this very well.  #5 history hypertension this appears stable on no medications recent blood pressures 120/71-107/67-129/63.  #6 depression she continues on Celexa this appears to be stable as well.  #7 history of hypothyroidism is on Synthroid we will update a TSH.  [id number]

## 2016-04-03 ENCOUNTER — Other Ambulatory Visit: Payer: Self-pay

## 2016-04-03 MED ORDER — FENTANYL 12 MCG/HR TD PT72: MEDICATED_PATCH | TRANSDERMAL | 0 refills | Status: DC

## 2016-04-03 NOTE — Telephone Encounter (Signed)
RX faxed to [facility] @ [phone]. Phone number [phone]  

## 2016-04-04 ENCOUNTER — Other Ambulatory Visit (HOSPITAL_COMMUNITY)
Admission: AD | Admit: 2016-04-04 | Discharge: 2016-04-04 | Disposition: A | Discharge status: Home / Self Care | Payer: Medicare Other | Source: Skilled Nursing Facility | Attending: Internal Medicine | Admitting: Internal Medicine

## 2016-04-08 ENCOUNTER — Encounter (HOSPITAL_COMMUNITY)
Admission: RE | Admit: 2016-04-08 | Discharge: 2016-04-08 | Disposition: A | Discharge status: Home / Self Care | Payer: Medicare Other | Source: Skilled Nursing Facility | Attending: Internal Medicine | Admitting: Internal Medicine

## 2016-04-08 DIAGNOSIS — E039 Hypothyroidism, unspecified: Secondary | ICD-10-CM | POA: Insufficient documentation

## 2016-04-08 DIAGNOSIS — S72002D Fracture of unspecified part of neck of left femur, subsequent encounter for closed fracture with routine healing: Secondary | ICD-10-CM | POA: Insufficient documentation

## 2016-04-08 DIAGNOSIS — F411 Generalized anxiety disorder: Secondary | ICD-10-CM | POA: Diagnosis not present

## 2016-04-08 DIAGNOSIS — R1312 Dysphagia, oropharyngeal phase: Secondary | ICD-10-CM | POA: Insufficient documentation

## 2016-04-08 DIAGNOSIS — X58XXXD Exposure to other specified factors, subsequent encounter: Secondary | ICD-10-CM | POA: Diagnosis not present

## 2016-04-08 LAB — TSH: TSH: 3.281 u[IU]/mL (ref 0.350–4.500)

## 2016-04-15 ENCOUNTER — Encounter (HOSPITAL_COMMUNITY)
Admission: RE | Admit: 2016-04-15 | Discharge: 2016-04-15 | Disposition: A | Discharge status: Home / Self Care | Payer: Medicare Other | Source: Skilled Nursing Facility | Attending: *Deleted | Admitting: *Deleted

## 2016-04-15 DIAGNOSIS — F411 Generalized anxiety disorder: Secondary | ICD-10-CM | POA: Diagnosis not present

## 2016-04-15 DIAGNOSIS — R1312 Dysphagia, oropharyngeal phase: Secondary | ICD-10-CM | POA: Diagnosis not present

## 2016-04-15 DIAGNOSIS — S72002D Fracture of unspecified part of neck of left femur, subsequent encounter for closed fracture with routine healing: Secondary | ICD-10-CM | POA: Diagnosis not present

## 2016-04-15 DIAGNOSIS — E039 Hypothyroidism, unspecified: Secondary | ICD-10-CM | POA: Diagnosis not present

## 2016-04-15 LAB — CBC
HEMATOCRIT: 37.1 % (ref 36.0–46.0)
Hemoglobin: 12 g/dL (ref 12.0–15.0)
MCH: 30.8 pg (ref 26.0–34.0)
MCHC: 32.3 g/dL (ref 30.0–36.0)
MCV: 95.1 fL (ref 78.0–100.0)
PLATELETS: 193 10*3/uL (ref 150–400)
RBC: 3.9 MIL/uL (ref 3.87–5.11)
RDW: 12.6 % (ref 11.5–15.5)
WBC: 4.7 10*3/uL (ref 4.0–10.5)

## 2016-04-15 LAB — COMPREHENSIVE METABOLIC PANEL
ALBUMIN: 3.6 g/dL (ref 3.5–5.0)
ALT: 14 U/L (ref 14–54)
ANION GAP: 6 (ref 5–15)
AST: 18 U/L (ref 15–41)
Alkaline Phosphatase: 55 U/L (ref 38–126)
BUN: 26 mg/dL — AB (ref 6–20)
CO2: 26 mmol/L (ref 22–32)
Calcium: 9.1 mg/dL (ref 8.9–10.3)
Chloride: 108 mmol/L (ref 101–111)
Creatinine, Ser: 0.67 mg/dL (ref 0.44–1.00)
GFR calc Af Amer: >60 mL/min (ref >60)
GFR calc non Af Amer: >60 mL/min (ref >60)
GLUCOSE: 102 mg/dL — AB (ref 65–99)
POTASSIUM: 4 mmol/L (ref 3.5–5.1)
SODIUM: 140 mmol/L (ref 135–145)
Total Bilirubin: 0.4 mg/dL (ref 0.3–1.2)
Total Protein: 6.6 g/dL (ref 6.5–8.1)

## 2016-04-15 LAB — MAGNESIUM: Magnesium: 2.1 mg/dL (ref 1.7–2.4)

## 2016-05-08 ENCOUNTER — Non-Acute Institutional Stay (SKILLED_NURSING_FACILITY): Payer: Medicare Other | Admitting: Internal Medicine

## 2016-05-08 ENCOUNTER — Encounter: Payer: Self-pay | Admitting: Internal Medicine

## 2016-05-08 DIAGNOSIS — S72002A Fracture of unspecified part of neck of left femur, initial encounter for closed fracture: Secondary | ICD-10-CM | POA: Diagnosis not present

## 2016-05-08 DIAGNOSIS — E039 Hypothyroidism, unspecified: Secondary | ICD-10-CM | POA: Diagnosis not present

## 2016-05-08 DIAGNOSIS — F32A Depression, unspecified: Secondary | ICD-10-CM

## 2016-05-08 DIAGNOSIS — F329 Major depressive disorder, single episode, unspecified: Secondary | ICD-10-CM

## 2016-05-08 DIAGNOSIS — F0391 Unspecified dementia with behavioral disturbance: Secondary | ICD-10-CM

## 2016-05-08 DIAGNOSIS — M810 Age-related osteoporosis without current pathological fracture: Secondary | ICD-10-CM

## 2016-05-08 NOTE — Progress Notes (Signed)
Location:   [facility] Room Number: 136/P Place of Service:  SNF [phone]) Provider:  Haze Justin, MD  Patient Care Team: Hendricks Limes, MD as PCP - General (Internal Medicine)  Extended Emergency Contact Information Primary Emergency Contact: Noreene Larsson of [place] Phone: [phone] Mobile Phone: [phone] Relation: Niece Secondary Emergency Contact: Harvie Bridge States of [place] Phone: [phone] Mobile Phone: [phone] Relation: Sister  Code Status:  DNR Goals of care: Advanced Directive information Advanced Directives 05/08/2016  Does patient have an advance directive? Yes  Type of Paramedic of [place];Out of facility DNR (pink MOST or yellow form)  Does patient want to make changes to advanced directive? No - Patient declined  Copy of advanced directive(s) in chart? Yes  Would patient like information on creating an advanced directive? -  Pre-existing out of facility DNR order (yellow form or pink MOST form) -     Chief Complaint  Patient presents with  . Medical Management of Chronic Issues    Routine Visit    HPI:  Pt is a 82 y.o. female seen today for medical management of chronic diseases.   Patient has H/O advanced Dementia and osteoporosis. She Had Sustained Right hip fracture which was followed by Left Hip fracture due to Fall in 07/16. Family decided not to get surgical repair. She has been Pain management in the Facility. Patiet Has severe Dementia and is unable to follow simple commands. Her speech is In coherent too. Her niece was in the room and it seems that patient has not had any problems with pain control. Patient Weight has been stable at 128 Lbs. Her appetite is good.   Past Medical History:  Diagnosis Date  . Dementia   . Osteoarthritis    Past Surgical History:  Procedure Laterality Date  . BREAST SURGERY    . EYE SURGERY    . HIP  ARTHROPLASTY Right 01/01/2014   Procedure: BIPOLAR ARTHROPLASTY RIGHT HIP;  Surgeon: Carole Civil, MD;  Location: [facility];  Service: Orthopedics;  Laterality: Right;    Allergies  Allergen Reactions  . Morphine And Related     Increased confusion    Current Outpatient Prescriptions on File Prior to Visit  Medication Sig Dispense Refill  . acetaminophen (TYLENOL) 500 MG tablet Take one to two tablets by mouth every 6 hours as needed for mild pain. DNE 3000mg  Tylenol in 24 hours from all sources.    . AMBULATORY NON FORMULARY MEDICATION Magic Cup By mouth twice daily with Lunch and Dinner    . citalopram (CELEXA) 10 MG tablet Take 20 mg by mouth daily.     . fentaNYL (DURAGESIC - DOSED MCG/HR) 12 MCG/HR Apply one patch topically as directed every 72 hours (every 3 days) for pain 10 patch 0  . HYDROcodone-acetaminophen (HYCET) 7.5-325 mg/15 ml solution Give 27ml by mouth every 6 hours as needed for pain. Max APAP 3gm/24 hours from all sources 240 mL 0  . levothyroxine (SYNTHROID, LEVOTHROID) 25 MCG tablet Take 25 mcg by mouth daily before breakfast.    . LORazepam (ATIVAN) 0.5 MG tablet Take 1/2 (0.25 mg) by mouth every 4 hours as needed for anxiety 90 tablet 5  . Nutritional Supplements (ENSURE ENLIVE PO) One by mouth daily    . senna-docusate (SENOKOT-S) 8.6-50 MG per tablet Take 1 tablet by mouth at bedtime.     No current facility-administered medications on file prior to visit.  Review of Systems  Unable to perform ROS: Dementia    Immunization History  Administered Date(s) Administered  . Influenza-Unspecified 05/10/2014  . Pneumococcal Polysaccharide-23 01/02/2014  . Tdap 01/01/2014   Pertinent  Health Maintenance Due  Topic Date Due  . PNA vac Low Risk Adult (2 of 2 - PCV13) 01/03/2015  . INFLUENZA VACCINE  07/04/2016 (Originally 03/04/2016)  . DEXA SCAN  Completed   No flowsheet data found. Functional Status Survey:    Vitals:   05/08/16 1311  BP: 138/77    Pulse: 75  Resp: 18  Temp: 98.3 F (36.8 C)  TempSrc: Oral  SpO2: 98%  Weight: 127 lb 3.2 oz (57.7 kg)  Height: 5\' 3"  (1.6 m)   Body mass index is 22.53 kg/m. Physical Exam  Constitutional: She appears well-developed and well-nourished.  HENT:  Head: Normocephalic.  Mouth/Throat: Oropharynx is clear and moist.  Cardiovascular: Normal rate, regular rhythm and normal heart sounds.   Pulmonary/Chest: Effort normal and breath sounds normal. No respiratory distress. She has no wheezes. She has no rales. She exhibits no tenderness.  Abdominal: Soft. Bowel sounds are normal. She exhibits no distension and no mass. There is no tenderness. There is no rebound and no guarding.  Musculoskeletal: She exhibits no edema.  Neurological: She is alert.  Not oriented. Cannot follow simple commands Just incoherent speech.    Labs reviewed:  Recent Labs  02/15/16 0819 04/15/16 0500  NA 139 140  K 4.1 4.0  CL 105 108  CO2 26 26  GLUCOSE 92 102*  BUN 23* 26*  CREATININE 0.61 0.67  CALCIUM 9.2 9.1  MG  --  2.1    Recent Labs  04/15/16 0500  AST 18  ALT 14  ALKPHOS 55  BILITOT 0.4  PROT 6.6  ALBUMIN 3.6    Recent Labs  02/15/16 0819 04/15/16 0500  WBC 4.7 4.7  HGB 12.7 12.0  HCT 39.5 37.1  MCV 94.3 95.1  PLT 203 193   Lab Results  Component Value Date   TSH 3.281 04/08/2016   No results found for: HGBA1C No results found for: CHOL, HDL, LDLCALC, LDLDIRECT, TRIG, CHOLHDL  Significant Diagnostic Results in last 30 days:  No results found.  Assessment/Plan  Senile osteoporosis Will start Patient on Calcium and Vit D supplement. Patient would not be candidate for any aggressive Osteoporotic therapy.  Dementia Patient stable. Continue Supportive therapy.  Depression Patient on Celexa and Mood  Seems stabel  Hypothyroidism TSH is stable Repeat in 6 months. Patient had all Labs done last month and they were all stable. No need to repeat labs. Pain seems  adequately controlled.   Family/ staff Communication: D/W Neice  Labs/tests ordered:

## 2016-06-05 ENCOUNTER — Non-Acute Institutional Stay (SKILLED_NURSING_FACILITY): Payer: Medicare Other | Admitting: Internal Medicine

## 2016-06-05 ENCOUNTER — Encounter: Payer: Self-pay | Admitting: Internal Medicine

## 2016-06-05 DIAGNOSIS — E039 Hypothyroidism, unspecified: Secondary | ICD-10-CM

## 2016-06-05 DIAGNOSIS — S72001G Fracture of unspecified part of neck of right femur, subsequent encounter for closed fracture with delayed healing: Secondary | ICD-10-CM

## 2016-06-05 DIAGNOSIS — F329 Major depressive disorder, single episode, unspecified: Secondary | ICD-10-CM | POA: Diagnosis not present

## 2016-06-05 DIAGNOSIS — F32A Depression, unspecified: Secondary | ICD-10-CM

## 2016-06-05 DIAGNOSIS — D696 Thrombocytopenia, unspecified: Secondary | ICD-10-CM

## 2016-06-05 DIAGNOSIS — F0391 Unspecified dementia with behavioral disturbance: Secondary | ICD-10-CM | POA: Diagnosis not present

## 2016-06-05 DIAGNOSIS — I10 Essential (primary) hypertension: Secondary | ICD-10-CM | POA: Diagnosis not present

## 2016-06-05 NOTE — Progress Notes (Signed)
Location:   [facility] Room Number: 136/P Place of Service:  SNF [phone]) Provider:  Freddi Starr, MD  Patient Care Team: Virgie Dad, MD as PCP - General (Internal Medicine)  Extended Emergency Contact Information Primary Emergency Contact: Noreene Larsson of [place] Phone: [phone] Mobile Phone: [phone] Relation: Niece Secondary Emergency Contact: Harvie Bridge States of [place] Phone: [phone] Mobile Phone: [phone] Relation: Sister  Code Status:  DNR Goals of care: Advanced Directive information Advanced Directives 06/05/2016  Does patient have an advance directive? Yes  Type of Paramedic of [place];Out of facility DNR (pink MOST or yellow form)  Does patient want to make changes to advanced directive? No - Patient declined  Copy of advanced directive(s) in chart? Yes  Would patient like information on creating an advanced directive? -  Pre-existing out of facility DNR order (yellow form or pink MOST form) -     Chief Complaint  Patient presents with  . Medical Management of Chronic Issues    Routine Visit   For medical management of dementia-bilateral hip fractures-chronic pain-hypertension-hypothyroidism-osteoporosis HPI:  Pt is a 82 y.o. female seen today for medical management of chronic diseases.  As noted above-she appears to be enjoying t time of stability.  She has a history of bilateral hip fractures on the right surgically repaired social fracture on the left however is managed conservatively-she is receiving Vicodin as well as a Duragesic patch and appears to be tolerating this well with good effect she appears to be comfortable lying in bed today.  She continues to have significant dementia with agitation at times with this appears well controlled currently she appears to be doing well with supportive care. Her weight appears to be stable at 127  pounds.  She does have a history hypothyroidism is on Synthroid 25 g a day and TSH done last September show stability at 3.281.         Past Medical History:  Diagnosis Date  . Dementia   . Osteoarthritis    Past Surgical History:  Procedure Laterality Date  . BREAST SURGERY    . EYE SURGERY    . HIP ARTHROPLASTY Right 01/01/2014   Procedure: BIPOLAR ARTHROPLASTY RIGHT HIP;  Surgeon: Carole Civil, MD;  Location: [facility];  Service: Orthopedics;  Laterality: Right;    Allergies  Allergen Reactions  . Morphine And Related     Increased confusion    Current Outpatient Prescriptions on File Prior to Visit  Medication Sig Dispense Refill  . acetaminophen (TYLENOL) 500 MG tablet Take one to two tablets by mouth every 6 hours as needed for mild to moderate pain. DNE 3000mg  Tylenol in 24 hours from all sources.    . AMBULATORY NON FORMULARY MEDICATION Magic Cup By mouth twice daily with Lunch and Dinner    . citalopram (CELEXA) 10 MG tablet Take 20 mg by mouth daily.     . fentaNYL (DURAGESIC - DOSED MCG/HR) 12 MCG/HR Apply one patch topically as directed every 72 hours (every 3 days) for pain 10 patch 0  . ferrous sulfate (KP FERROUS SULFATE) 325 (65 FE) MG tablet Take 325 mg by mouth 2 (two) times daily with a meal.    . HYDROcodone-acetaminophen (HYCET) 7.5-325 mg/15 ml solution Give 83ml by mouth every 6 hours as needed for pain. Max APAP 3gm/24 hours from all sources 240 mL 0  . levothyroxine (SYNTHROID, LEVOTHROID) 25 MCG tablet Take 25  mcg by mouth daily before breakfast.    . LORazepam (ATIVAN) 0.5 MG tablet Take 1/2 (0.25 mg) by mouth every 4 hours as needed for anxiety 90 tablet 5  . Nutritional Supplements (ENSURE ENLIVE PO) One by mouth daily    . senna-docusate (SENOKOT-S) 8.6-50 MG per tablet Take 1 tablet by mouth at bedtime.     No current facility-administered medications on file prior to visit.     Review of Systems   Essentially unattainable secondary  to dementia please see history of present illness  Immunization History  Administered Date(s) Administered  . Influenza-Unspecified 05/10/2014, 05/02/2016  . Pneumococcal Polysaccharide-23 01/02/2014  . Tdap 01/01/2014   Pertinent  Health Maintenance Due  Topic Date Due  . PNA vac Low Risk Adult (2 of 2 - PCV13) 01/03/2015  . INFLUENZA VACCINE  Completed  . DEXA SCAN  Completed   No flowsheet data found. Functional Status Survey:    Vitals:   06/05/16 1334  BP: 120/68  Pulse: 64  Resp: 18  Temp: 99 F (37.2 C)  TempSrc: Oral  SpO2: 98%  Weight is stable at 127.2 pounds  Physical Exam  In general this is a frail elderly female in no distress she appears to be bright and alert today lying  In bed no sign of distress here.  Her skin is warm and dry there is well-healed surgical scar right hip.   Oropharynx appears clear mucous membranes moist.  Eyes appear reactive to light visual acuity appears grossly intact.  Chest is clear to auscultation there is no labored A breathing effort is somewhat poor.  Heart is regular rate and rhythm without murmur gallop or rub she does not have significant lower extremity edema.  Abdomen soft nontender positive bowel sounds.  .  Muscle skeletal again history of bilateral hip fractures I do not note any acute deformities here she has general frailty with significant weakness of her lower extremities bilaterally there is again is a well-healed surgical scar on the right hip.  Neurologic is grossly intact her speech is clear but is confused  Psych she is oriented to self ---was pleasant continues to have difficulty following  some verbal commands    Labs reviewed:  Recent Labs  02/15/16 0819 04/15/16 0500  NA 139 140  K 4.1 4.0  CL 105 108  CO2 26 26  GLUCOSE 92 102*  BUN 23* 26*  CREATININE 0.61 0.67  CALCIUM 9.2 9.1  MG  --  2.1    Recent Labs  04/15/16 0500  AST 18  ALT 14  ALKPHOS 55  BILITOT 0.4    PROT 6.6  ALBUMIN 3.6    Recent Labs  02/15/16 0819 04/15/16 0500  WBC 4.7 4.7  HGB 12.7 12.0  HCT 39.5 37.1  MCV 94.3 95.1  PLT 203 193   Lab Results  Component Value Date   TSH 3.281 04/08/2016   No results found for: HGBA1C No results found for: CHOL, HDL, LDLCALC, LDLDIRECT, TRIG, CHOLHDL  Significant Diagnostic Results in last 30 days:  No results found.  Assessment/Plan   #1 history of bilateral hip fractures-again a poor candidate for aggressive treatment and left hip fracture has been treated nonoperatively-pain appears to be controlled with Duragesic patch as well as Vicodin as needed she appears to be comfortable   #2 dementia this appears to be quite severe suspect with the severity she would not really benefit from Aricept or Namenda-has Ativan as needed for agitation but apparently this  has not needed much was pleasant and cooperative today which was the case last time I saw her as well.  #3 thrombocytopenia this appears stable with platelets of 193,000 on lab done September 2017  #4 failure to thrive she appears to have gained about 7 pounds since this spring which is encouraging and this has remained stable over the past several months she continues to take supplements apparently does this very well.  #5 history hypertension this appears stable on no medications recent blood pressures 120/68-119/89-112/84 .  #6 depression she continues on Celexa this appears to be stable as well.  #7 history of hypothyroidism is on Synthroid--TSH has been within normal limits most recently 3.281 on lab done 04/08/2016  #8 Osteoporosis she continues on calcium with vitamin D not a candidate for aggressive treatment.  #9 anemia-she is on iron hemoglobin show stability at 12.0 on lab done September 2017   [id number]

## 2016-06-09 ENCOUNTER — Non-Acute Institutional Stay (SKILLED_NURSING_FACILITY): Payer: Medicare Other | Admitting: Internal Medicine

## 2016-06-09 ENCOUNTER — Encounter (HOSPITAL_COMMUNITY)
Admission: RE | Admit: 2016-06-09 | Discharge: 2016-06-09 | Disposition: A | Discharge status: Home / Self Care | Payer: Medicare Other | Source: Skilled Nursing Facility | Attending: Internal Medicine | Admitting: Internal Medicine

## 2016-06-09 ENCOUNTER — Encounter: Payer: Self-pay | Admitting: Internal Medicine

## 2016-06-09 DIAGNOSIS — R05 Cough: Secondary | ICD-10-CM

## 2016-06-09 DIAGNOSIS — F411 Generalized anxiety disorder: Secondary | ICD-10-CM | POA: Insufficient documentation

## 2016-06-09 DIAGNOSIS — E039 Hypothyroidism, unspecified: Secondary | ICD-10-CM | POA: Insufficient documentation

## 2016-06-09 DIAGNOSIS — R1312 Dysphagia, oropharyngeal phase: Secondary | ICD-10-CM | POA: Insufficient documentation

## 2016-06-09 DIAGNOSIS — R0989 Other specified symptoms and signs involving the circulatory and respiratory systems: Secondary | ICD-10-CM | POA: Diagnosis not present

## 2016-06-09 DIAGNOSIS — S72002D Fracture of unspecified part of neck of left femur, subsequent encounter for closed fracture with routine healing: Secondary | ICD-10-CM | POA: Diagnosis not present

## 2016-06-09 DIAGNOSIS — R059 Cough, unspecified: Secondary | ICD-10-CM

## 2016-06-09 DIAGNOSIS — R509 Fever, unspecified: Secondary | ICD-10-CM

## 2016-06-09 DIAGNOSIS — X58XXXD Exposure to other specified factors, subsequent encounter: Secondary | ICD-10-CM | POA: Diagnosis not present

## 2016-06-09 LAB — CBC WITH DIFFERENTIAL/PLATELET
BASOS ABS: 0 10*3/uL (ref 0.0–0.1)
Basophils Relative: 0 %
EOS PCT: 1 %
Eosinophils Absolute: 0.1 10*3/uL (ref 0.0–0.7)
HCT: 35.3 % — ABNORMAL LOW (ref 36.0–46.0)
Hemoglobin: 11.3 g/dL — ABNORMAL LOW (ref 12.0–15.0)
LYMPHS PCT: 18 %
Lymphs Abs: 1.5 10*3/uL (ref 0.7–4.0)
MCH: 30.7 pg (ref 26.0–34.0)
MCHC: 32 g/dL (ref 30.0–36.0)
MCV: 95.9 fL (ref 78.0–100.0)
MONO ABS: 1.7 10*3/uL — AB (ref 0.1–1.0)
Monocytes Relative: 20 %
Neutro Abs: 5.2 10*3/uL (ref 1.7–7.7)
Neutrophils Relative %: 61 %
PLATELETS: 226 10*3/uL (ref 150–400)
RBC: 3.68 MIL/uL — ABNORMAL LOW (ref 3.87–5.11)
RDW: 12.7 % (ref 11.5–15.5)
WBC: 8.5 10*3/uL (ref 4.0–10.5)

## 2016-06-09 LAB — BASIC METABOLIC PANEL
ANION GAP: 7 (ref 5–15)
BUN: 27 mg/dL — AB (ref 6–20)
CALCIUM: 8.5 mg/dL — AB (ref 8.9–10.3)
CO2: 25 mmol/L (ref 22–32)
Chloride: 105 mmol/L (ref 101–111)
Creatinine, Ser: 0.9 mg/dL (ref 0.44–1.00)
GFR calc Af Amer: >60 mL/min (ref >60)
GFR, EST NON AFRICAN AMERICAN: 55 mL/min — AB (ref >60)
GLUCOSE: 105 mg/dL — AB (ref 65–99)
Potassium: 4.8 mmol/L (ref 3.5–5.1)
Sodium: 137 mmol/L (ref 135–145)

## 2016-06-09 NOTE — Progress Notes (Signed)
Location:   [facility] Room Number: 136/P Place of Service:  SNF [phone]) Provider:  [place]an[place] Lewis  Virgie Dad, MD  Patient Care Team: Virgie Dad, MD as PCP - General (Internal Medicine)  Extended Emergency Contact Information Primary Emergency Contact: Noreene Larsson of [place] Phone: [phone] Mobile Phone: [phone] Relation: Niece Secondary Emergency Contact: Harvie Bridge States of [place] Phone: [phone] Mobile Phone: [phone] Relation: Sister  Code Status:  DNR Goals of care: Advanced Directive information Advanced Directives 06/09/2016  Does patient have an advance directive? Yes  Type of Advance Directive Out of facility DNR (pink MOST or yellow form)  Does patient want to make changes to advanced directive? No - Patient declined  Copy of advanced directive(s) in chart? Yes  Would patient like information on creating an advanced directive? -  Pre-existing out of facility DNR order (yellow form or pink MOST form) -     Chief Complaint  Patient presents with  . Acute Visit    Congestion    HPI:  Pt is a 82 y.o. female seen today for an acute visit for Nursing concerns about increased congestion.  Patient has significant dementia and cannot really give any review of systems however nursing this morning noted some increased congestion.  Vital signs show she has a low-grade temperature most recently of 100.5.   Apparently at noon today she also had a vomiting episode after eating lunch.  Currently she is resting in bed comfortably but does appear weaker than her normal self-.  She does have a history of significant dementia with bilateral hip fractures with the one on the right surgically repaired the left is managed conservatively.       Past Medical History:  Diagnosis Date  . Dementia   . Osteoarthritis    Past Surgical History:  Procedure Laterality Date  . BREAST SURGERY      . EYE SURGERY    . HIP ARTHROPLASTY Right 01/01/2014   Procedure: BIPOLAR ARTHROPLASTY RIGHT HIP;  Surgeon: Carole Civil, MD;  Location: [facility];  Service: Orthopedics;  Laterality: Right;    Allergies  Allergen Reactions  . Morphine And Related     Increased confusion    Current Outpatient Prescriptions on File Prior to Visit  Medication Sig Dispense Refill  . acetaminophen (TYLENOL) 500 MG tablet Take one to two tablets by mouth every 6 hours as needed for mild to moderate pain. DNE 3000mg  Tylenol in 24 hours from all sources.    . AMBULATORY NON FORMULARY MEDICATION Magic Cup By mouth twice daily with Lunch and Dinner    . calcium-vitamin D (OSCAL WITH D) 500-200 MG-UNIT tablet Take 1 tablet by mouth 2 (two) times daily.    . citalopram (CELEXA) 10 MG tablet Take 20 mg by mouth daily.     . fentaNYL (DURAGESIC - DOSED MCG/HR) 12 MCG/HR Apply one patch topically as directed every 72 hours (every 3 days) for pain 10 patch 0  . ferrous sulfate (KP FERROUS SULFATE) 325 (65 FE) MG tablet Take 325 mg by mouth 2 (two) times daily with a meal.    . HYDROcodone-acetaminophen (HYCET) 7.5-325 mg/15 ml solution Give 2ml by mouth every 6 hours as needed for pain. Max APAP 3gm/24 hours from all sources 240 mL 0  . levothyroxine (SYNTHROID, LEVOTHROID) 25 MCG tablet Take 25 mcg by mouth daily before breakfast.    . LORazepam (ATIVAN) 0.5 MG tablet Take 1/2 (0.25 mg)  by mouth every 4 hours as needed for anxiety 90 tablet 5  . Nutritional Supplements (ENSURE ENLIVE PO) One by mouth daily    . senna-docusate (SENOKOT-S) 8.6-50 MG per tablet Take 1 tablet by mouth at bedtime.     No current facility-administered medications on file prior to visit.      Review of Systems  This is essentially unattainable secondary to dementia-I did note however that she did have a dry cough when I entered the room   Immunization History  Administered Date(s) Administered  . Influenza-Unspecified 05/10/2014,  05/02/2016  . Pneumococcal Polysaccharide-23 01/02/2014  . Tdap 01/01/2014   Pertinent  Health Maintenance Due  Topic Date Due  . PNA vac Low Risk Adult (2 of 2 - PCV13) 01/03/2015  . INFLUENZA VACCINE  Completed  . DEXA SCAN  Completed   No flowsheet data found. Functional Status Survey:    Vitals:   06/09/16 1227  BP: 98/62  Pulse: 84  Resp: (!) 22  Temp: 100 F (37.8 C)  TempSrc: Oral  SpO2: 100%  Of note update her vital signs show a temperature of 100.5 blood pressure 122/43 pulse of 80  Physical Exam   In general this is a frail elderly female in no distress lying in bed she appears somewhat sleepy but easily arousable.  Her skin is warm and dry.  Oropharynx was difficult to assess since patient would not really open her mouth.  Eyes sclerae and conjunctivae clear appears to have possibly a small amount of clear drainage  Chest there is poor respiratory effort shallow air entry appears to have some congestion although this is somewhat difficult to tell.  Heart is regular rate and rhythm with an occasional skipped beat she does not have significant lower extremity edema.    Abdomen is soft mildly protuberant with positive bowel sounds does not appear to be acutely tender.  Musculoskeletal history of bilateral hip fractures with general frailty is able to move her upper extremities at baseline does have significant weakness of her legs which is not new.  Neurologic is grossly intact she does not speak much but cranial nerves appear intact I don't see any lateralizing findings.  Psych-findings compatible with severe dementia     Labs reviewed:  Recent Labs  02/15/16 0819 04/15/16 0500  NA 139 140  K 4.1 4.0  CL 105 108  CO2 26 26  GLUCOSE 92 102*  BUN 23* 26*  CREATININE 0.61 0.67  CALCIUM 9.2 9.1  MG  --  2.1    Recent Labs  04/15/16 0500  AST 18  ALT 14  ALKPHOS 55  BILITOT 0.4  PROT 6.6  ALBUMIN 3.6    Recent Labs  02/15/16 0819  04/15/16 0500  WBC 4.7 4.7  HGB 12.7 12.0  HCT 39.5 37.1  MCV 94.3 95.1  PLT 203 193   Lab Results  Component Value Date   TSH 3.281 04/08/2016   No results found for: HGBA1C No results found for: CHOL, HDL, LDLCALC, LDLDIRECT, TRIG, CHOLHDL  Significant Diagnostic Results in last 30 days:  No results found.  Assessment/Plan #1 cough with chest congestion and low-grade fever Will make Robitussin routine every 6 hours for 48 hours and then when necessary-.  Also obtain a two-view chest x-ray.  Monitor vital signs pulse ox every shift for now to ensure stability.  Also update lab work including a CBC with differential and metabolic panel stat   Addendum-we have obtained results of the x-ray which  does not show any acute process.  CBC shows white count within normal range at 8.5 hemoglobin stable at 11.3 platelets are 226.  Suspect there may be a bronchitis component here will start Zithromax 5 mg daily for 5 days-and also again she will be receiving the Robitussin continue to monitor vital signs with pulse ox.  I did reevaluate patient later in the day she was resting in bed comfortably sleeping but easily arousable apparently there's been no further vomiting-no sign of distress although she still has a dry nonproductive cough  This  plan was discussed with Dr. Nilsa Nutting note greater than 40 minutes spent assessing patient-reviewing her chart-reviewing her labs-discussing her status with nursing staff-and coordinating and formulating plan of care-of note greater than 50% of time spent a plan of care with input as noted above    .        Oralia Manis, [place]

## 2016-06-11 ENCOUNTER — Other Ambulatory Visit: Payer: Self-pay | Admitting: *Deleted

## 2016-06-11 DIAGNOSIS — F329 Major depressive disorder, single episode, unspecified: Secondary | ICD-10-CM | POA: Diagnosis not present

## 2016-06-11 DIAGNOSIS — F419 Anxiety disorder, unspecified: Secondary | ICD-10-CM | POA: Diagnosis not present

## 2016-06-11 DIAGNOSIS — F0390 Unspecified dementia without behavioral disturbance: Secondary | ICD-10-CM | POA: Diagnosis not present

## 2016-06-11 MED ORDER — LORAZEPAM 0.5 MG PO TABS: ORAL_TABLET | ORAL | 5 refills | Status: DC

## 2016-06-11 NOTE — Telephone Encounter (Signed)
[facility] #[phone] Fax: [phone]   

## 2016-07-03 ENCOUNTER — Other Ambulatory Visit: Payer: Self-pay

## 2016-07-03 MED ORDER — FENTANYL 12 MCG/HR TD PT72: MEDICATED_PATCH | TRANSDERMAL | 0 refills | Status: DC

## 2016-07-03 NOTE — Telephone Encounter (Signed)
RX faxed to [facility] @ [phone]. Phone number [phone]  

## 2016-07-07 DIAGNOSIS — S72002D Fracture of unspecified part of neck of left femur, subsequent encounter for closed fracture with routine healing: Secondary | ICD-10-CM | POA: Diagnosis not present

## 2016-07-07 DIAGNOSIS — R1312 Dysphagia, oropharyngeal phase: Secondary | ICD-10-CM | POA: Diagnosis not present

## 2016-07-07 DIAGNOSIS — F0390 Unspecified dementia without behavioral disturbance: Secondary | ICD-10-CM | POA: Diagnosis not present

## 2016-07-08 DIAGNOSIS — S72002D Fracture of unspecified part of neck of left femur, subsequent encounter for closed fracture with routine healing: Secondary | ICD-10-CM | POA: Diagnosis not present

## 2016-07-08 DIAGNOSIS — F0390 Unspecified dementia without behavioral disturbance: Secondary | ICD-10-CM | POA: Diagnosis not present

## 2016-07-08 DIAGNOSIS — R1312 Dysphagia, oropharyngeal phase: Secondary | ICD-10-CM | POA: Diagnosis not present

## 2016-07-09 DIAGNOSIS — R1312 Dysphagia, oropharyngeal phase: Secondary | ICD-10-CM | POA: Diagnosis not present

## 2016-07-09 DIAGNOSIS — S72002D Fracture of unspecified part of neck of left femur, subsequent encounter for closed fracture with routine healing: Secondary | ICD-10-CM | POA: Diagnosis not present

## 2016-07-09 DIAGNOSIS — F0390 Unspecified dementia without behavioral disturbance: Secondary | ICD-10-CM | POA: Diagnosis not present

## 2016-07-10 DIAGNOSIS — R1312 Dysphagia, oropharyngeal phase: Secondary | ICD-10-CM | POA: Diagnosis not present

## 2016-07-10 DIAGNOSIS — F0390 Unspecified dementia without behavioral disturbance: Secondary | ICD-10-CM | POA: Diagnosis not present

## 2016-07-10 DIAGNOSIS — S72002D Fracture of unspecified part of neck of left femur, subsequent encounter for closed fracture with routine healing: Secondary | ICD-10-CM | POA: Diagnosis not present

## 2016-07-14 DIAGNOSIS — S72002D Fracture of unspecified part of neck of left femur, subsequent encounter for closed fracture with routine healing: Secondary | ICD-10-CM | POA: Diagnosis not present

## 2016-07-14 DIAGNOSIS — F0390 Unspecified dementia without behavioral disturbance: Secondary | ICD-10-CM | POA: Diagnosis not present

## 2016-07-14 DIAGNOSIS — R1312 Dysphagia, oropharyngeal phase: Secondary | ICD-10-CM | POA: Diagnosis not present

## 2016-07-15 DIAGNOSIS — S72002D Fracture of unspecified part of neck of left femur, subsequent encounter for closed fracture with routine healing: Secondary | ICD-10-CM | POA: Diagnosis not present

## 2016-07-15 DIAGNOSIS — F0390 Unspecified dementia without behavioral disturbance: Secondary | ICD-10-CM | POA: Diagnosis not present

## 2016-07-15 DIAGNOSIS — R1312 Dysphagia, oropharyngeal phase: Secondary | ICD-10-CM | POA: Diagnosis not present

## 2016-07-16 DIAGNOSIS — R1312 Dysphagia, oropharyngeal phase: Secondary | ICD-10-CM | POA: Diagnosis not present

## 2016-07-16 DIAGNOSIS — F0390 Unspecified dementia without behavioral disturbance: Secondary | ICD-10-CM | POA: Diagnosis not present

## 2016-07-16 DIAGNOSIS — S72002D Fracture of unspecified part of neck of left femur, subsequent encounter for closed fracture with routine healing: Secondary | ICD-10-CM | POA: Diagnosis not present

## 2016-07-21 DIAGNOSIS — R1312 Dysphagia, oropharyngeal phase: Secondary | ICD-10-CM | POA: Diagnosis not present

## 2016-07-21 DIAGNOSIS — F0390 Unspecified dementia without behavioral disturbance: Secondary | ICD-10-CM | POA: Diagnosis not present

## 2016-07-21 DIAGNOSIS — S72002D Fracture of unspecified part of neck of left femur, subsequent encounter for closed fracture with routine healing: Secondary | ICD-10-CM | POA: Diagnosis not present

## 2016-07-22 DIAGNOSIS — F0390 Unspecified dementia without behavioral disturbance: Secondary | ICD-10-CM | POA: Diagnosis not present

## 2016-07-22 DIAGNOSIS — S72002D Fracture of unspecified part of neck of left femur, subsequent encounter for closed fracture with routine healing: Secondary | ICD-10-CM | POA: Diagnosis not present

## 2016-07-22 DIAGNOSIS — R1312 Dysphagia, oropharyngeal phase: Secondary | ICD-10-CM | POA: Diagnosis not present

## 2016-07-23 DIAGNOSIS — S72002D Fracture of unspecified part of neck of left femur, subsequent encounter for closed fracture with routine healing: Secondary | ICD-10-CM | POA: Diagnosis not present

## 2016-07-23 DIAGNOSIS — F0390 Unspecified dementia without behavioral disturbance: Secondary | ICD-10-CM | POA: Diagnosis not present

## 2016-07-23 DIAGNOSIS — R1312 Dysphagia, oropharyngeal phase: Secondary | ICD-10-CM | POA: Diagnosis not present

## 2016-07-24 DIAGNOSIS — S72002D Fracture of unspecified part of neck of left femur, subsequent encounter for closed fracture with routine healing: Secondary | ICD-10-CM | POA: Diagnosis not present

## 2016-07-24 DIAGNOSIS — F0390 Unspecified dementia without behavioral disturbance: Secondary | ICD-10-CM | POA: Diagnosis not present

## 2016-07-24 DIAGNOSIS — R1312 Dysphagia, oropharyngeal phase: Secondary | ICD-10-CM | POA: Diagnosis not present

## 2016-07-29 DIAGNOSIS — F0390 Unspecified dementia without behavioral disturbance: Secondary | ICD-10-CM | POA: Diagnosis not present

## 2016-07-29 DIAGNOSIS — R1312 Dysphagia, oropharyngeal phase: Secondary | ICD-10-CM | POA: Diagnosis not present

## 2016-07-29 DIAGNOSIS — S72002D Fracture of unspecified part of neck of left femur, subsequent encounter for closed fracture with routine healing: Secondary | ICD-10-CM | POA: Diagnosis not present

## 2016-07-30 DIAGNOSIS — R1312 Dysphagia, oropharyngeal phase: Secondary | ICD-10-CM | POA: Diagnosis not present

## 2016-07-30 DIAGNOSIS — S72002D Fracture of unspecified part of neck of left femur, subsequent encounter for closed fracture with routine healing: Secondary | ICD-10-CM | POA: Diagnosis not present

## 2016-07-30 DIAGNOSIS — F0390 Unspecified dementia without behavioral disturbance: Secondary | ICD-10-CM | POA: Diagnosis not present

## 2016-07-31 DIAGNOSIS — S72002D Fracture of unspecified part of neck of left femur, subsequent encounter for closed fracture with routine healing: Secondary | ICD-10-CM | POA: Diagnosis not present

## 2016-07-31 DIAGNOSIS — I70203 Unspecified atherosclerosis of native arteries of extremities, bilateral legs: Secondary | ICD-10-CM | POA: Diagnosis not present

## 2016-07-31 DIAGNOSIS — B351 Tinea unguium: Secondary | ICD-10-CM | POA: Diagnosis not present

## 2016-07-31 DIAGNOSIS — M79675 Pain in left toe(s): Secondary | ICD-10-CM | POA: Diagnosis not present

## 2016-07-31 DIAGNOSIS — M79674 Pain in right toe(s): Secondary | ICD-10-CM | POA: Diagnosis not present

## 2016-07-31 DIAGNOSIS — F0390 Unspecified dementia without behavioral disturbance: Secondary | ICD-10-CM | POA: Diagnosis not present

## 2016-07-31 DIAGNOSIS — R1312 Dysphagia, oropharyngeal phase: Secondary | ICD-10-CM | POA: Diagnosis not present

## 2016-08-05 DIAGNOSIS — F0390 Unspecified dementia without behavioral disturbance: Secondary | ICD-10-CM | POA: Diagnosis not present

## 2016-08-05 DIAGNOSIS — R1312 Dysphagia, oropharyngeal phase: Secondary | ICD-10-CM | POA: Diagnosis not present

## 2016-08-05 DIAGNOSIS — S72002D Fracture of unspecified part of neck of left femur, subsequent encounter for closed fracture with routine healing: Secondary | ICD-10-CM | POA: Diagnosis not present

## 2016-08-06 DIAGNOSIS — S72002D Fracture of unspecified part of neck of left femur, subsequent encounter for closed fracture with routine healing: Secondary | ICD-10-CM | POA: Diagnosis not present

## 2016-08-06 DIAGNOSIS — R1312 Dysphagia, oropharyngeal phase: Secondary | ICD-10-CM | POA: Diagnosis not present

## 2016-08-06 DIAGNOSIS — F0390 Unspecified dementia without behavioral disturbance: Secondary | ICD-10-CM | POA: Diagnosis not present

## 2016-08-11 DIAGNOSIS — S72002D Fracture of unspecified part of neck of left femur, subsequent encounter for closed fracture with routine healing: Secondary | ICD-10-CM | POA: Diagnosis not present

## 2016-08-11 DIAGNOSIS — R1312 Dysphagia, oropharyngeal phase: Secondary | ICD-10-CM | POA: Diagnosis not present

## 2016-08-11 DIAGNOSIS — F0390 Unspecified dementia without behavioral disturbance: Secondary | ICD-10-CM | POA: Diagnosis not present

## 2016-08-14 DIAGNOSIS — S72002D Fracture of unspecified part of neck of left femur, subsequent encounter for closed fracture with routine healing: Secondary | ICD-10-CM | POA: Diagnosis not present

## 2016-08-14 DIAGNOSIS — F0390 Unspecified dementia without behavioral disturbance: Secondary | ICD-10-CM | POA: Diagnosis not present

## 2016-08-14 DIAGNOSIS — R1312 Dysphagia, oropharyngeal phase: Secondary | ICD-10-CM | POA: Diagnosis not present

## 2016-09-03 ENCOUNTER — Other Ambulatory Visit: Payer: Self-pay | Admitting: *Deleted

## 2016-09-03 MED ORDER — FENTANYL 12 MCG/HR TD PT72: MEDICATED_PATCH | TRANSDERMAL | 0 refills | Status: DC

## 2016-09-03 NOTE — Telephone Encounter (Signed)
[facility] #[phone] Fax: [phone]   

## 2016-09-04 ENCOUNTER — Encounter: Payer: Self-pay | Admitting: Internal Medicine

## 2016-09-04 ENCOUNTER — Non-Acute Institutional Stay (SKILLED_NURSING_FACILITY): Payer: Medicare Other | Admitting: Internal Medicine

## 2016-09-04 DIAGNOSIS — F0391 Unspecified dementia with behavioral disturbance: Secondary | ICD-10-CM | POA: Diagnosis not present

## 2016-09-04 DIAGNOSIS — E039 Hypothyroidism, unspecified: Secondary | ICD-10-CM

## 2016-09-04 DIAGNOSIS — I10 Essential (primary) hypertension: Secondary | ICD-10-CM | POA: Diagnosis not present

## 2016-09-04 DIAGNOSIS — S72001G Fracture of unspecified part of neck of right femur, subsequent encounter for closed fracture with delayed healing: Secondary | ICD-10-CM | POA: Diagnosis not present

## 2016-09-04 DIAGNOSIS — D696 Thrombocytopenia, unspecified: Secondary | ICD-10-CM | POA: Diagnosis not present

## 2016-09-04 DIAGNOSIS — M810 Age-related osteoporosis without current pathological fracture: Secondary | ICD-10-CM | POA: Diagnosis not present

## 2016-09-04 NOTE — Progress Notes (Signed)
Location:   [place] Room Number: 141/W Place of Service:  SNF [phone]) Provider:  Freddi Starr, MD  Patient Care Team: Virgie Dad, MD as PCP - General (Internal Medicine)  Extended Emergency Contact Information Primary Emergency Contact: Noreene Larsson of [place] Phone: [phone] Mobile Phone: [phone] Relation: Niece Secondary Emergency Contact: Harvie Bridge States of [place] Phone: [phone] Mobile Phone: [phone] Relation: Sister  Code Status:  DNR Goals of care: Advanced Directive information Advanced Directives 09/04/2016  Does Patient Have a Medical Advance Directive? Yes  Type of Advance Directive Out of facility DNR (pink MOST or yellow form)  Does patient want to make changes to medical advance directive? No - Patient declined  Copy of [place] in Chart? -  Would patient like information on creating a medical advance directive? No - Patient declined  Pre-existing out of facility DNR order (yellow form or pink MOST form) -     Chief Complaint  Patient presents with  . Medical Management of Chronic Issues    Routine Visit   Medical management of chronic medical issues including history of bilateral hip fractures-hypertension-chronic pain-hypothyroidism-osteoporosis.   HPI:  Pt is a 83 y.o. female seen today for medical management of chronic diseases. As noted above- She appears to be doing relatively well with supportive care she is actually gained about 5 pounds since November She was seen back in November for what was thought to be a URI she responded well to Zithromax.  . She has a history of bilateral hip fractures the right side was surgically repaired while on the left however is managed conservatively-she does receive Vicodin as needed for pain she continues on a Duragesic patch as well  .  She has dementia with anxiety at times at this point appears  well controlled she is actually eating supper with assistance tonight and appears to be alert makes eye contact does not speak much she appears to do well with supportive care however and has noted her weight appears to be trending up. He does have an order for Ativan 0.25 mg every 4 hours when necessary and apparently when needed this is quite effective she is also on Celexa for coexistent depression.    She does have a history hypothyroidism she is on Synthroid 25 g a day--TSH back in October was normal range at 3.281   She also has a history of anemia hemoglobin in November 2017 was 11.3 which shows stability.  She is on iron.            Past Medical History:  Diagnosis Date  . Dementia   . Osteoarthritis    Past Surgical History:  Procedure Laterality Date  . BREAST SURGERY    . EYE SURGERY    . HIP ARTHROPLASTY Right 01/01/2014   Procedure: BIPOLAR ARTHROPLASTY RIGHT HIP;  Surgeon: Carole Civil, MD;  Location: [facility];  Service: Orthopedics;  Laterality: Right;    Allergies  Allergen Reactions  . Morphine And Related     Increased confusion    Current Outpatient Prescriptions on File Prior to Visit  Medication Sig Dispense Refill  . acetaminophen (TYLENOL) 500 MG tablet Take one to two tablets by mouth every 6 hours as needed for mild to moderate pain. DNE 3000mg  Tylenol in 24 hours from all sources.    . AMBULATORY NON FORMULARY MEDICATION Magic Cup By mouth twice daily with Lunch and Dinner    .  calcium-vitamin D (OSCAL WITH D) 500-200 MG-UNIT tablet Take 1 tablet by mouth 2 (two) times daily.    . citalopram (CELEXA) 10 MG tablet Take 20 mg by mouth daily.     . fentaNYL (DURAGESIC - DOSED MCG/HR) 12 MCG/HR Apply one patch topically as directed every 72 hours (every 3 days) note site. Remove old patch before applying new patch 10 patch 0  . ferrous sulfate (KP FERROUS SULFATE) 325 (65 FE) MG tablet Take 325 mg by mouth 2 (two) times daily with a meal.    .  HYDROcodone-acetaminophen (HYCET) 7.5-325 mg/15 ml solution Give 28ml by mouth every 6 hours as needed for pain. Max APAP 3gm/24 hours from all sources 240 mL 0  . levothyroxine (SYNTHROID, LEVOTHROID) 25 MCG tablet Take 25 mcg by mouth daily before breakfast.    . LORazepam (ATIVAN) 0.5 MG tablet Take 1/2 (0.25 mg) by mouth every 4 hours as needed for anxiety 90 tablet 5  . Nutritional Supplements (ENSURE ENLIVE PO) One by mouth daily    . senna-docusate (SENOKOT-S) 8.6-50 MG per tablet Take 1 tablet by mouth at bedtime.     No current facility-administered medications on file prior to visit.      Review of Systems   This is essentially unattainable secondary to dementia please see history of present illness  Immunization History  Administered Date(s) Administered  . Influenza-Unspecified 05/10/2014, 05/02/2016  . Pneumococcal Polysaccharide-23 01/02/2014  . Tdap 01/01/2014   Pertinent  Health Maintenance Due  Topic Date Due  . PNA vac Low Risk Adult (2 of 2 - PCV13) 01/03/2015  . INFLUENZA VACCINE  Completed  . DEXA SCAN  Completed   No flowsheet data found. Functional Status Survey:    Vitals:   09/04/16 1501  BP: 128/70  Pulse: 73  Resp: 20  Temp: 97.4 F (36.3 C)  TempSrc: Oral  SpO2: 98%  Weight: 132 lb 14.4 oz (60.3 kg)  Height: 5\' 3"  (1.6 m)   Body mass index is 23.54 kg/m. Physical Exam   In general this is a frail elderly female in no distress she appears to be bright and alert resting comfortably she is being fed her supper appears to have moderate appetite  Her skin is warm and dry there is well-healed surgical scar right hip.  Oropharynx appears clear mucous membranes moist.  Eyes appear reactive to light visual acuity appears grossly intact.  Chest is clear to auscultation there is no labored A breathing effort is somewhat poor.  Heart is regular rate and rhythm without murmur gallop or rub she does not have significant lower extremity  edema.  Abdomen soft nontender positive bowel sounds.  .  Muscleskeletal again history of bilateral hip fractures I do not note any acute deformities here she has general frailty with significant weakness of her lower extremities bilaterally there is again is a well-healed surgical scar on the right hip.  Neurologic is grossly intact she is not really speaking much this evening does make eye contact and does talk at times  Psych she is oriented to self ---was pleasant continues to have difficulty following   verbal commands  Labs reviewed:  Recent Labs  02/15/16 0819 04/15/16 0500 06/09/16 1623  NA 139 140 137  K 4.1 4.0 4.8  CL 105 108 105  CO2 26 26 25   GLUCOSE 92 102* 105*  BUN 23* 26* 27*  CREATININE 0.61 0.67 0.90  CALCIUM 9.2 9.1 8.5*  MG  --  2.1  --  Recent Labs  04/15/16 0500  AST 18  ALT 14  ALKPHOS 55  BILITOT 0.4  PROT 6.6  ALBUMIN 3.6    Recent Labs  02/15/16 0819 04/15/16 0500 06/09/16 1623  WBC 4.7 4.7 8.5  NEUTROABS  --   --  5.2  HGB 12.7 12.0 11.3*  HCT 39.5 37.1 35.3*  MCV 94.3 95.1 95.9  PLT 203 193 226   Lab Results  Component Value Date   TSH 3.281 04/08/2016   No results found for: HGBA1C No results found for: CHOL, HDL, LDLCALC, LDLDIRECT, TRIG, CHOLHDL  Significant Diagnostic Results in last 30 days:  No results found.  Assessment/Plan  #1 history of bilateral hip fractures-she is a poor candidate for any aggressive treatment and left hip fracture has been treated nonoperatively-pain appears to be controlled with Duragesic patch as well as Vicodin when needed.  #2 dementia this continues to be quite severe however she appears to be doing well with supportive care appetite appears to be fairly decent she's actually gained 5 pounds over the past few months-has Ativan as needed for agitation continues to be pleasant and cooperative with exam this evening.  #3 thrombocytopenia this appears stable with platelets Of  226,000 on lab done in November we will update this family is okay with ordering further labs.  #4 history of anemia this appears stable with a hemoglobin of 11.3 back in early November again will update pending family's approval.  #5 failure to thrive again she continues to gain weight which is encouraging she is doing well with supportive care.  #6 history of hypertension this appears stable on no medications currently most recently blood pressure 128/70 which appears relatively baseline with previous readings.  #7 depression this appears relatively stable on Celexa.  #8 history of hypothyroidism she continues on Synthroid TSH back in September was within normal limits will update this if family is okay with. It  #9 history of osteoporosis continues on calcium supplementation with vitamin D.  Again patient appears to be stable as she has gained weight which is encouraging-will update lab work including a CBC TSH and BMP if family approves.    [id number]

## 2016-09-05 ENCOUNTER — Encounter (HOSPITAL_COMMUNITY)
Admission: RE | Admit: 2016-09-05 | Discharge: 2016-09-05 | Disposition: A | Discharge status: Home / Self Care | Payer: Medicare Other | Source: Skilled Nursing Facility | Attending: Internal Medicine | Admitting: Internal Medicine

## 2016-09-05 DIAGNOSIS — S72002D Fracture of unspecified part of neck of left femur, subsequent encounter for closed fracture with routine healing: Secondary | ICD-10-CM | POA: Diagnosis not present

## 2016-09-05 DIAGNOSIS — R1312 Dysphagia, oropharyngeal phase: Secondary | ICD-10-CM | POA: Diagnosis not present

## 2016-09-05 DIAGNOSIS — E039 Hypothyroidism, unspecified: Secondary | ICD-10-CM | POA: Diagnosis not present

## 2016-09-05 DIAGNOSIS — X58XXXD Exposure to other specified factors, subsequent encounter: Secondary | ICD-10-CM | POA: Insufficient documentation

## 2016-09-05 DIAGNOSIS — F411 Generalized anxiety disorder: Secondary | ICD-10-CM | POA: Diagnosis not present

## 2016-09-05 LAB — CBC
HCT: 35.4 % — ABNORMAL LOW (ref 36.0–46.0)
Hemoglobin: 11.5 g/dL — ABNORMAL LOW (ref 12.0–15.0)
MCH: 31.4 pg (ref 26.0–34.0)
MCHC: 32.5 g/dL (ref 30.0–36.0)
MCV: 96.7 fL (ref 78.0–100.0)
Platelets: 187 K/uL (ref 150–400)
RBC: 3.66 MIL/uL — ABNORMAL LOW (ref 3.87–5.11)
RDW: 13.1 % (ref 11.5–15.5)
WBC: 4.2 K/uL (ref 4.0–10.5)

## 2016-09-05 LAB — BASIC METABOLIC PANEL
Anion gap: 6 (ref 5–15)
BUN: 20 mg/dL (ref 6–20)
CO2: 27 mmol/L (ref 22–32)
Calcium: 8.8 mg/dL — ABNORMAL LOW (ref 8.9–10.3)
Chloride: 105 mmol/L (ref 101–111)
Creatinine, Ser: 0.68 mg/dL (ref 0.44–1.00)
GFR calc Af Amer: >60 mL/min (ref >60)
GLUCOSE: 93 mg/dL (ref 65–99)
POTASSIUM: 4 mmol/L (ref 3.5–5.1)
Sodium: 138 mmol/L (ref 135–145)

## 2016-09-05 LAB — TSH: TSH: 3.955 u[IU]/mL (ref 0.350–4.500)

## 2016-10-01 ENCOUNTER — Other Ambulatory Visit: Payer: Self-pay | Admitting: *Deleted

## 2016-10-01 MED ORDER — FENTANYL 12 MCG/HR TD PT72: MEDICATED_PATCH | TRANSDERMAL | 0 refills | Status: DC

## 2016-10-01 NOTE — Telephone Encounter (Signed)
[facility] #[phone] Fax: [phone]   

## 2016-10-23 ENCOUNTER — Encounter: Payer: Self-pay | Admitting: Internal Medicine

## 2016-10-23 ENCOUNTER — Non-Acute Institutional Stay (SKILLED_NURSING_FACILITY): Payer: Medicare Other | Admitting: Internal Medicine

## 2016-10-23 DIAGNOSIS — F0391 Unspecified dementia with behavioral disturbance: Secondary | ICD-10-CM | POA: Diagnosis not present

## 2016-10-23 DIAGNOSIS — E039 Hypothyroidism, unspecified: Secondary | ICD-10-CM

## 2016-10-23 DIAGNOSIS — M810 Age-related osteoporosis without current pathological fracture: Secondary | ICD-10-CM | POA: Diagnosis not present

## 2016-10-23 NOTE — Progress Notes (Signed)
Location:   [place] Room Number: 141/W Place of Service:  SNF [phone]) Provider:  Clydene Fake, MD  Patient Care Team: Virgie Dad, MD as PCP - General (Internal Medicine)  Extended Emergency Contact Information Primary Emergency Contact: Noreene Larsson of [place] Phone: [phone] Mobile Phone: [phone] Relation: Niece Secondary Emergency Contact: Harvie Bridge States of [place] Phone: [phone] Mobile Phone: [phone] Relation: Sister  Code Status:  DNR Goals of care: Advanced Directive information Advanced Directives 10/23/2016  Does Patient Have a Medical Advance Directive? Yes  Type of Advance Directive Out of facility DNR (pink MOST or yellow form)  Does patient want to make changes to medical advance directive? No - Patient declined  Copy of [place] in Chart? -  Would patient like information on creating a medical advance directive? No - Patient declined  Pre-existing out of facility DNR order (yellow form or pink MOST form) -     Chief Complaint  Patient presents with  . Medical Management of Chronic Issues    Routine Visit    HPI:  Pt is a 83 y.o. female seen today for medical management of chronic diseases.   Patient has h/o Advanced dementia and Osteoporosis . She Had Sustained Right hip fracture which was followed by Left Hip fracture due to Fall in 07/16. Family decided not to get surgical repair. She has been Pain management in Facility.  Patient is unable to give me any history. Most of the history was obtained by her sitter. She says patient has been comfortable with no pain. She mostly sleeps well at night. Sometimes need ativan for anxiety. Her weight this month is stable at 128 lbs.   Past Medical History:  Diagnosis Date  . Dementia   . Osteoarthritis    Past Surgical History:  Procedure Laterality Date  . BREAST SURGERY    . EYE SURGERY      . HIP ARTHROPLASTY Right 01/01/2014   Procedure: BIPOLAR ARTHROPLASTY RIGHT HIP;  Surgeon: Carole Civil, MD;  Location: [facility];  Service: Orthopedics;  Laterality: Right;    Allergies  Allergen Reactions  . Morphine And Related     Increased confusion    Current Outpatient Prescriptions on File Prior to Visit  Medication Sig Dispense Refill  . acetaminophen (TYLENOL) 325 MG suppository Place 325 mg rectally every 4 (four) hours as needed.    Marland Kitchen acetaminophen (TYLENOL) 500 MG tablet Take one to two tablets by mouth every 6 hours as needed for mild to moderate pain. DNE 3000mg  Tylenol in 24 hours from all sources.    . AMBULATORY NON FORMULARY MEDICATION Magic Cup By mouth twice daily with Lunch and Dinner    . calcium-vitamin D (OSCAL WITH D) 500-200 MG-UNIT tablet Take 1 tablet by mouth 2 (two) times daily.    . citalopram (CELEXA) 10 MG tablet Take 20 mg by mouth daily.     Marland Kitchen Dextromethorphan-Guaifenesin 5-100 MG/5ML LIQD Give 15 ml by mouth every 6 hours prn    . fentaNYL (DURAGESIC - DOSED MCG/HR) 12 MCG/HR Apply one patch topically as directed every 72 hours (every 3 days) note site. Remove old patch before applying new patch 10 patch 0  . ferrous sulfate (KP FERROUS SULFATE) 325 (65 FE) MG tablet Take 325 mg by mouth 2 (two) times daily with a meal.    . HYDROcodone-acetaminophen (HYCET) 7.5-325 mg/15 ml solution Give 25ml by mouth every 6  hours as needed for pain. Max APAP 3gm/24 hours from all sources 240 mL 0  . levothyroxine (SYNTHROID, LEVOTHROID) 25 MCG tablet Take 25 mcg by mouth daily before breakfast.    . LORazepam (ATIVAN) 0.5 MG tablet Take 1/2 (0.25 mg) by mouth every 4 hours as needed for anxiety 90 tablet 5  . Nutritional Supplements (ENSURE ENLIVE PO) One by mouth daily    . senna-docusate (SENOKOT-S) 8.6-50 MG per tablet Take 1 tablet by mouth at bedtime.    Marland Kitchen zinc oxide 20 % ointment Apply 1 application topically as needed for irritation.     No current  facility-administered medications on file prior to visit.     Review of Systems  Unable to perform ROS: Dementia    Immunization History  Administered Date(s) Administered  . Influenza-Unspecified 05/10/2014, 05/02/2016  . Pneumococcal Polysaccharide-23 01/02/2014  . Tdap 01/01/2014   Pertinent  Health Maintenance Due  Topic Date Due  . PNA vac Low Risk Adult (2 of 2 - PCV13) 07/04/2017 (Originally 01/03/2015)  . INFLUENZA VACCINE  Completed  . DEXA SCAN  Completed   No flowsheet data found. Functional Status Survey:    Vitals:   10/23/16 1445  BP: 118/69  Pulse: 83  Resp: 18  Temp: 97.9 F (36.6 C)  TempSrc: Oral   There is no height or weight on file to calculate BMI. Physical Exam  Constitutional: She appears well-developed and well-nourished.  HENT:  Head: Normocephalic.  Eyes: Pupils are equal, round, and reactive to light.  Neck: Neck supple.  Cardiovascular: Normal rate, regular rhythm and normal heart sounds.   No murmur heard. Pulmonary/Chest: Effort normal and breath sounds normal.  Abdominal: Soft. Bowel sounds are normal. She exhibits no distension. There is no tenderness. There is no rebound.  Musculoskeletal:  Patient has new Pressure u  Neurological: She is alert.  Patient does not follow any commands.  Skin:  Patient has DTI on her Left ankle   Psychiatric: Her behavior is normal.    Labs reviewed:  Recent Labs  04/15/16 0500 06/09/16 1623 09/05/16 0700  NA 140 137 138  K 4.0 4.8 4.0  CL 108 105 105  CO2 26 25 27   GLUCOSE 102* 105* 93  BUN 26* 27* 20  CREATININE 0.67 0.90 0.68  CALCIUM 9.1 8.5* 8.8*  MG 2.1  --   --     Recent Labs  04/15/16 0500  AST 18  ALT 14  ALKPHOS 55  BILITOT 0.4  PROT 6.6  ALBUMIN 3.6    Recent Labs  04/15/16 0500 06/09/16 1623 09/05/16 0700  WBC 4.7 8.5 4.2  NEUTROABS  --  5.2  --   HGB 12.0 11.3* 11.5*  HCT 37.1 35.3* 35.4*  MCV 95.1 95.9 96.7  PLT 193 226 187   Lab Results    Component Value Date   TSH 3.955 09/05/2016   No results found for: HGBA1C No results found for: CHOL, HDL, LDLCALC, LDLDIRECT, TRIG, CHOLHDL  Significant Diagnostic Results in last 30 days:  No results found.  Assessment/Plan Senile osteoporosis S/P B/L hip Fractures Pain controlled . Patient not candidate for aggressive therapy.  Dementia with behavioral disturbance,  Sometimes need Ativan for anxiety but stable otherwise. Continue supportive care  Hypothyroidism TSH normal Continue same dose.  Pressure wound on left foot. D/W Nurse wound is healing well. She has Boots now to keep pressure off  All labs were reviewed and they are stable.     Family/ staff Communication:  Labs/tests ordered:

## 2016-10-29 DIAGNOSIS — F419 Anxiety disorder, unspecified: Secondary | ICD-10-CM | POA: Diagnosis not present

## 2016-10-29 DIAGNOSIS — F0391 Unspecified dementia with behavioral disturbance: Secondary | ICD-10-CM | POA: Diagnosis not present

## 2016-11-12 DIAGNOSIS — F329 Major depressive disorder, single episode, unspecified: Secondary | ICD-10-CM | POA: Diagnosis not present

## 2016-11-12 DIAGNOSIS — F419 Anxiety disorder, unspecified: Secondary | ICD-10-CM | POA: Diagnosis not present

## 2016-11-12 DIAGNOSIS — F0391 Unspecified dementia with behavioral disturbance: Secondary | ICD-10-CM | POA: Diagnosis not present

## 2016-11-13 ENCOUNTER — Non-Acute Institutional Stay (SKILLED_NURSING_FACILITY): Payer: Medicare Other | Admitting: Internal Medicine

## 2016-11-13 DIAGNOSIS — E039 Hypothyroidism, unspecified: Secondary | ICD-10-CM

## 2016-11-13 DIAGNOSIS — F0391 Unspecified dementia with behavioral disturbance: Secondary | ICD-10-CM | POA: Diagnosis not present

## 2016-11-13 DIAGNOSIS — S72002A Fracture of unspecified part of neck of left femur, initial encounter for closed fracture: Secondary | ICD-10-CM

## 2016-11-13 DIAGNOSIS — F329 Major depressive disorder, single episode, unspecified: Secondary | ICD-10-CM | POA: Diagnosis not present

## 2016-11-13 DIAGNOSIS — F32A Depression, unspecified: Secondary | ICD-10-CM

## 2016-11-13 NOTE — Progress Notes (Signed)
Location:   [facility] Room Number: 141/W Place of Service:  SNF [phone]) Provider:  Freddi Starr, MD  Patient Care Team: Virgie Dad, MD as PCP - General (Internal Medicine)  Extended Emergency Contact Information Primary Emergency Contact: Noreene Larsson of [place] Phone: [phone] Mobile Phone: [phone] Relation: Niece Secondary Emergency Contact: Harvie Bridge States of [place] Phone: [phone] Mobile Phone: [phone] Relation: Sister  Code Status:  DNR Goals of care: Advanced Directive information Advanced Directives 09/04/2016  Does Patient Have a Medical Advance Directive? Yes  Type of Advance Directive Out of facility DNR (pink MOST or yellow form)  Does patient want to make changes to medical advance directive? No - Patient declined  Copy of [place] in Chart? -  Would patient like information on creating a medical advance directive? No - Patient declined  Pre-existing out of facility DNR order (yellow form or pink MOST form) -         Chief Complaint  Patient presents with  . Medical Management of Chronic Issues    Routine Visit   Medical management of chronic medical issues including history of bilateral hip fractures-hypertension-chronic pain-hypothyroidism-osteoporosis.   HPI:  Pt is an 83 y.o. female seen today for medical management of chronic diseases. As noted above- She appears to be doing relatively well with supportive care --According to the tech who is feeding her this evening she eats fairly well weight appears relatively stable at around 130 pounds She was seen back in November for what was thought to be a URI she responded well to Zithromax.  . She has a history of bilateral hip fractures the right side was surgically repaired while on the left however is managed conservatively-she does receive Vicodin as needed for pain she  continues on a Duragesic patch as well  .  She has dementia with anxiety at times at this point appears well controlled she is actually eating supper with assistance tonight and appears to be alert makes eye contact does not speak much she appears to do well with supportive care She does have an order for Ativan 0.25 mg every 4 hours when necessary and apparently when needed this is quite effective she is also on Celexa for coexistent depression.    She does have a history hypothyroidism she is on Synthroid 25 g a day--TSH in February 2018 continues to be within normal range at 3.955   She also has a history of anemia Hemoglobin is stable at 11.5 on lab done in February 2018  She is on iron.                Past Medical History:  Diagnosis Date  . Dementia   . Osteoarthritis         Past Surgical History:  Procedure Laterality Date  . BREAST SURGERY    . EYE SURGERY    . HIP ARTHROPLASTY Right 01/01/2014   Procedure: BIPOLAR ARTHROPLASTY RIGHT HIP;  Surgeon: Carole Civil, MD;  Location: [facility];  Service: Orthopedics;  Laterality: Right;         Allergies  Allergen Reactions  . Morphine And Related     Increased confusion          Current Outpatient Prescriptions on File Prior to Visit  Medication Sig Dispense Refill  . acetaminophen (TYLENOL) 500 MG tablet Take one to two tablets by mouth every 6 hours as needed for mild  to moderate pain. DNE 3000mg  Tylenol in 24 hours from all sources.    . AMBULATORY NON FORMULARY MEDICATION Magic Cup By mouth twice daily with Lunch and Dinner    . calcium-vitamin D (OSCAL WITH D) 500-200 MG-UNIT tablet Take 1 tablet by mouth 2 (two) times daily.    . citalopram (CELEXA) 10 MG tablet Take 20 mg by mouth daily.     . fentaNYL (DURAGESIC - DOSED MCG/HR) 12 MCG/HR Apply one patch topically as directed every 72 hours (every 3 days) note site. Remove old patch before applying new patch 10  patch 0  . ferrous sulfate (KP FERROUS SULFATE) 325 (65 FE) MG tablet Take 325 mg by mouth 2 (two) times daily with a meal.    . HYDROcodone-acetaminophen (HYCET) 7.5-325 mg/15 ml solution Give 3ml by mouth every 6 hours as needed for pain. Max APAP 3gm/24 hours from all sources 240 mL 0  . levothyroxine (SYNTHROID, LEVOTHROID) 25 MCG tablet Take 25 mcg by mouth daily before breakfast.    . LORazepam (ATIVAN) 0.5 MG tablet Take 1/2 (0.25 mg) by mouth every 4 hours as needed for anxiety 90 tablet 5  . Nutritional Supplements (ENSURE ENLIVE PO) One by mouth daily    . senna-docusate (SENOKOT-S) 8.6-50 MG per tablet Take 1 tablet by mouth at bedtime.     No current facility-administered medications on file prior to visit.      Review of Systems   This is essentially unattainable secondary to dementia please see history of present illness Per discussion with tech in room she eats pretty well she appears to be comfortable speaks minimally      Immunization History  Administered Date(s) Administered  . Influenza-Unspecified 05/10/2014, 05/02/2016  . Pneumococcal Polysaccharide-23 01/02/2014  . Tdap 01/01/2014       Pertinent  Health Maintenance Due  Topic Date Due  . PNA vac Low Risk Adult (2 of 2 - PCV13) 01/03/2015  . INFLUENZA VACCINE  Completed  . DEXA SCAN  Completed   No flowsheet data found. Functional Status Survey:                                    Temperature is pending pulse is 80 respirations 18 blood pressure taken manually was 130/72 her weight is 129 this appears relatively stable appears to average around 1:30 here recently Physical Exam   In general this is a frail elderly female in no distress she appears to be bright and alert resting comfortably she is being fed her supper -according to her tech is eating pretty well tonight  Her skin is warm and dry there is well-healed surgical scar right hip.  Oropharynx appears clear  mucous membranes moist.  Eyes appear reactive to light visual acuity appears grossly intact. She has prescription lenses  Chest is clear to auscultation there is no labored A breathing effort is somewhat poor.  Heart is regular rate and rhythm without murmur gallop or rub she does not have significant lower extremity edema.  Abdomen soft nontender positive bowel sounds.  .  Muscleskeletal again history of bilateral hip fractures I do not note any acute deformities here she has general frailty with significant weakness of her lower extremities bilaterally there is again is a well-healed surgical scar on the right hip.  She currently has protective boots on bilaterally I cannot really appreciate any wounds on her feet  Neurologic is grossly  intact she is not really speaking much this evening does make eye contact and does talk at times in  fact she said thank you when I left the room this evening  Psych she is oriented to self ---was pleasant continues to have difficulty following verbal commands  Labs reviewed:  09/05/2016.  Sodium 138 potassium 4.0 BUN 20 creatinine 0.68.  TSH-3.955.  WBC 4.2 hemoglobin 11.5 platelets 187  Recent Labs (within last 365 days)   Recent Labs  02/15/16 0819 04/15/16 0500 06/09/16 1623  NA 139 140 137  K 4.1 4.0 4.8  CL 105 108 105  CO2 26 26 25   GLUCOSE 92 102* 105*  BUN 23* 26* 27*  CREATININE 0.61 0.67 0.90  CALCIUM 9.2 9.1 8.5*  MG  --  2.1  --       Recent Labs (within last 365 days)   Recent Labs  04/15/16 0500  AST 18  ALT 14  ALKPHOS 55  BILITOT 0.4  PROT 6.6  ALBUMIN 3.6      Recent Labs (within last 365 days)   Recent Labs  02/15/16 0819 04/15/16 0500 06/09/16 1623  WBC 4.7 4.7 8.5  NEUTROABS  --   --  5.2  HGB 12.7 12.0 11.3*  HCT 39.5 37.1 35.3*  MCV 94.3 95.1 95.9  PLT 203 193 226     Recent Labs       Lab Results  Component Value Date   TSH 3.281 04/08/2016     Recent  Labs  No results found for: HGBA1C   Recent Labs  No results found for: CHOL, HDL, LDLCALC, LDLDIRECT, TRIG, CHOLHDL    Significant Diagnostic Results in last 30 days:  Imaging Results  No results found.    Assessment/Plan  #1 history of bilateral hip fractures-she is a poor candidate for any aggressive treatment and left hip fracture has been treated nonoperatively-pain appears to be controlled with Duragesic patch as well as Vicodin when needed. She continues on calcium supplementation for history of osteoporosis  #2 dementia this continues to be quite severe however she appears to be doing well with supportive care appetite appears to be fairly decent weight appears to be fairly stable.  #3 thrombocytopenia this appears stable with platelets Of 187,000 on lab done in early February 2018 .  #4 history of anemia this appears stable with a hemoglobin of 11.5 on the February lab she is on iron  #5 failure to thrive again she continues to gain weight which is encouraging she is doing well with supportive care.  #6 history of hypertension this appears stable on no medications currently most recently blood pressure 130/72  #7 depression this appears relatively stable on Celexa.  #8 history of hypothyroidism she continues on Synthroid TSH remains within normal limits most  recent lab in early February   .       [id number]

## 2016-12-03 ENCOUNTER — Other Ambulatory Visit: Payer: Self-pay | Admitting: *Deleted

## 2016-12-03 MED ORDER — FENTANYL 12 MCG/HR TD PT72: MEDICATED_PATCH | TRANSDERMAL | 0 refills | Status: DC

## 2016-12-03 NOTE — Telephone Encounter (Signed)
[facility] #[phone] Fax: [phone]   

## 2016-12-09 DIAGNOSIS — I70203 Unspecified atherosclerosis of native arteries of extremities, bilateral legs: Secondary | ICD-10-CM | POA: Diagnosis not present

## 2016-12-09 DIAGNOSIS — B351 Tinea unguium: Secondary | ICD-10-CM | POA: Diagnosis not present

## 2016-12-09 DIAGNOSIS — M79674 Pain in right toe(s): Secondary | ICD-10-CM | POA: Diagnosis not present

## 2016-12-09 DIAGNOSIS — M79675 Pain in left toe(s): Secondary | ICD-10-CM | POA: Diagnosis not present

## 2016-12-25 ENCOUNTER — Non-Acute Institutional Stay (SKILLED_NURSING_FACILITY): Payer: Medicare Other | Admitting: Internal Medicine

## 2016-12-25 ENCOUNTER — Encounter: Payer: Self-pay | Admitting: Internal Medicine

## 2016-12-25 DIAGNOSIS — F0391 Unspecified dementia with behavioral disturbance: Secondary | ICD-10-CM | POA: Diagnosis not present

## 2016-12-25 DIAGNOSIS — E039 Hypothyroidism, unspecified: Secondary | ICD-10-CM | POA: Diagnosis not present

## 2016-12-25 DIAGNOSIS — M810 Age-related osteoporosis without current pathological fracture: Secondary | ICD-10-CM | POA: Diagnosis not present

## 2016-12-25 NOTE — Progress Notes (Signed)
Location:   [facility] Room Number: 141/W Place of Service:  SNF [phone]) Provider:  Kyung Rudd, Rene Kocher, MD  Patient Care Team: Virgie Dad, MD as PCP - General (Internal Medicine)  Extended Emergency Contact Information Primary Emergency Contact: Noreene Larsson of [place] Phone: [phone] Mobile Phone: [phone] Relation: Niece Secondary Emergency Contact: Harvie Bridge States of [place] Phone: [phone] Mobile Phone: [phone] Relation: Sister  Code Status:  DNR Goals of care: Advanced Directive information Advanced Directives 12/25/2016  Does Patient Have a Medical Advance Directive? Yes  Type of Advance Directive Out of facility DNR (pink MOST or yellow form)  Does patient want to make changes to medical advance directive? No - Patient declined  Copy of [place] in Chart? -  Would patient like information on creating a medical advance directive? No - Patient declined  Pre-existing out of facility DNR order (yellow form or pink MOST form) -     Chief Complaint  Patient presents with  . Medical Management of Chronic Issues    Routine Visit    HPI:  Pt is a 83 y.o. female seen today for medical management of chronic diseases.   Patient has h/o Advanced dementia and Osteoporosis . She Had Sustained Right hip fracture which was followed by Left Hip fracture due to Fall in 07/16. Family decided not to get surgical repair. She has been Pain management in Facility.  Patient was recently started on Namenda by psych services to see if it helps with her behavior issues due to her dementia. Patient does not have any nursing issues. Her weight is stable at 129 lbs. She was very interactive today and was following some of my simple commands.But otherwise was mumbling. Past Medical History:  Diagnosis Date  . Dementia   . Osteoarthritis    Past Surgical History:  Procedure  Laterality Date  . BREAST SURGERY    . EYE SURGERY    . HIP ARTHROPLASTY Right 01/01/2014   Procedure: BIPOLAR ARTHROPLASTY RIGHT HIP;  Surgeon: Carole Civil, MD;  Location: [facility];  Service: Orthopedics;  Laterality: Right;    Allergies  Allergen Reactions  . Morphine And Related     Increased confusion    Outpatient Encounter Prescriptions as of 12/25/2016  Medication Sig  . acetaminophen (TYLENOL) 325 MG suppository Place 325 mg rectally every 4 (four) hours as needed.  Marland Kitchen acetaminophen (TYLENOL) 500 MG tablet Take one to two tablets by mouth every 6 hours as needed for mild to moderate pain. DNE 3000mg  Tylenol in 24 hours from all sources.  . calcium-vitamin D (OSCAL WITH D) 500-200 MG-UNIT tablet Take 1 tablet by mouth 2 (two) times daily.  . citalopram (CELEXA) 10 MG tablet Take 20 mg by mouth daily.   Marland Kitchen Dextromethorphan-Guaifenesin 5-100 MG/5ML LIQD Give 15 ml by mouth every 6 hours prn  . fentaNYL (DURAGESIC - DOSED MCG/HR) 12 MCG/HR Apply one patch topically as directed every 72 hours (every 3 days) note site. Remove old patch before applying new patch  . ferrous sulfate (KP FERROUS SULFATE) 325 (65 FE) MG tablet Take 325 mg by mouth 2 (two) times daily with a meal.  . HYDROcodone-acetaminophen (HYCET) 7.5-325 mg/15 ml solution Give 85ml by mouth every 6 hours as needed for pain. Max APAP 3gm/24 hours from all sources  . levothyroxine (SYNTHROID, LEVOTHROID) 25 MCG tablet Take 25 mcg by mouth daily before breakfast.  . loratadine (CLARITIN) 10 MG  tablet Take 10 mg by mouth daily.  Marland Kitchen LORazepam (ATIVAN) 0.5 MG tablet Take 1/2 (0.25 mg) by mouth every 4 hours as needed for anxiety  . memantine (NAMENDA) 5 MG tablet Take 5 mg by mouth daily.  . NON FORMULARY Magic cup give once a  day  . Nutritional Supplements (ENSURE ENLIVE PO) One by mouth daily  . senna-docusate (SENOKOT-S) 8.6-50 MG per tablet Take 1 tablet by mouth at bedtime.  Marland Kitchen zinc oxide 20 % ointment Apply 1  application topically as needed for irritation.  . [DISCONTINUED] AMBULATORY NON FORMULARY MEDICATION Magic Cup By mouth twice daily with Lunch and Dinner   No facility-administered encounter medications on file as of 12/25/2016.      Review of Systems  Unable to perform ROS: Dementia    Immunization History  Administered Date(s) Administered  . Influenza-Unspecified 05/10/2014, 05/02/2016  . Pneumococcal Polysaccharide-23 01/02/2014  . Tdap 01/01/2014   Pertinent  Health Maintenance Due  Topic Date Due  . PNA vac Low Risk Adult (2 of 2 - PCV13) 07/04/2017 (Originally 01/03/2015)  . INFLUENZA VACCINE  03/04/2017  . DEXA SCAN  Completed   No flowsheet data found. Functional Status Survey:    Vitals:   12/25/16 1005  BP: 130/70  Pulse: 75  Resp: 20  Temp: 97.4 F (36.3 C)  TempSrc: Oral  Weight: 128 lb 4.8 oz (58.2 kg)  Height: 5\' 3"  (1.6 m)   Body mass index is 22.73 kg/m. Physical Exam Constitutional: She appears well-developed and well-nourished.  HENT:  Head: Normocephalic.  Eyes: Pupils are equal, round, and reactive to light.  Neck: Neck supple.  Cardiovascular: Normal rate, regular rhythm and normal heart sounds.   No murmur heard. Pulmonary/Chest: Effort normal and breath sounds normal.  Abdominal: Soft. Bowel sounds are normal. She exhibits no distension. There is no tenderness. There is no rebound.  Neurological: She is alert.  Patient did follow some commands.  Psychiatric: Her behavior is normal.    Labs reviewed:  Recent Labs  04/15/16 0500 06/09/16 1623 09/05/16 0700  NA 140 137 138  K 4.0 4.8 4.0  CL 108 105 105  CO2 26 25 27   GLUCOSE 102* 105* 93  BUN 26* 27* 20  CREATININE 0.67 0.90 0.68  CALCIUM 9.1 8.5* 8.8*  MG 2.1  --   --     Recent Labs  04/15/16 0500  AST 18  ALT 14  ALKPHOS 55  BILITOT 0.4  PROT 6.6  ALBUMIN 3.6    Recent Labs  04/15/16 0500 06/09/16 1623 09/05/16 0700  WBC 4.7 8.5 4.2  NEUTROABS  --  5.2   --   HGB 12.0 11.3* 11.5*  HCT 37.1 35.3* 35.4*  MCV 95.1 95.9 96.7  PLT 193 226 187   Lab Results  Component Value Date   TSH 3.955 09/05/2016   No results found for: HGBA1C No results found for: CHOL, HDL, LDLCALC, LDLDIRECT, TRIG, CHOLHDL  Significant Diagnostic Results in last 30 days:  No results found.  Assessment/Plan  Senile osteoporosis S/P B/L hip Fractures Pain controlled . Patient not candidate for aggressive therapy. On calcium and Vit D. On fentanyl patch for pain control.  Dementia with behavioral disturbance,  Sometimes need Ativan for anxiety but stable otherwise. Continue supportive care Now also On Namenda  Hypothyroidism TSH normal in 02/18 Continue same dose. Labs were reviewed from 02/18 . Will repeat labs in next visit. Family/ staff Communication:   Labs/tests ordered:

## 2016-12-31 ENCOUNTER — Other Ambulatory Visit: Payer: Self-pay | Admitting: *Deleted

## 2016-12-31 MED ORDER — LORAZEPAM 0.5 MG PO TABS: ORAL_TABLET | ORAL | 0 refills | Status: DC

## 2016-12-31 NOTE — Telephone Encounter (Signed)
[facility] #[phone] Fax: [phone]   

## 2017-01-05 ENCOUNTER — Other Ambulatory Visit: Payer: Self-pay | Admitting: *Deleted

## 2017-01-05 MED ORDER — LORAZEPAM 0.5 MG PO TABS: ORAL_TABLET | ORAL | 0 refills | Status: DC

## 2017-01-05 NOTE — Telephone Encounter (Signed)
[facility] #[phone] Fax: [phone]   

## 2017-01-16 ENCOUNTER — Encounter: Payer: Self-pay | Admitting: Internal Medicine

## 2017-01-16 ENCOUNTER — Non-Acute Institutional Stay (SKILLED_NURSING_FACILITY): Payer: Medicare Other | Admitting: Internal Medicine

## 2017-01-16 DIAGNOSIS — F0391 Unspecified dementia with behavioral disturbance: Secondary | ICD-10-CM | POA: Diagnosis not present

## 2017-01-16 DIAGNOSIS — M810 Age-related osteoporosis without current pathological fracture: Secondary | ICD-10-CM | POA: Diagnosis not present

## 2017-01-16 DIAGNOSIS — S72001G Fracture of unspecified part of neck of right femur, subsequent encounter for closed fracture with delayed healing: Secondary | ICD-10-CM

## 2017-01-16 DIAGNOSIS — E039 Hypothyroidism, unspecified: Secondary | ICD-10-CM | POA: Diagnosis not present

## 2017-01-16 DIAGNOSIS — I10 Essential (primary) hypertension: Secondary | ICD-10-CM | POA: Diagnosis not present

## 2017-01-16 NOTE — Progress Notes (Signed)
Location:   [facility] Room Number: 141/W Place of Service:  SNF [phone]) Provider:  Freddi Starr, MD  Patient Care Team: Virgie Dad, MD as PCP - General (Internal Medicine)  Extended Emergency Contact Information Primary Emergency Contact: Noreene Larsson of [place] Phone: [phone] Mobile Phone: [phone] Relation: Niece Secondary Emergency Contact: Harvie Bridge States of [place] Phone: [phone] Mobile Phone: [phone] Relation: Sister  Code Status:  DNR Goals of care: Advanced Directive information Advanced Directives 01/16/2017  Does Patient Have a Medical Advance Directive? Yes  Type of Advance Directive Out of facility DNR (pink MOST or yellow form)  Does patient want to make changes to medical advance directive? No - Patient declined  Copy of [place] in Chart? -  Would patient like information on creating a medical advance directive? No - Patient declined  Pre-existing out of facility DNR order (yellow form or pink MOST form) -     Chief Complaint  Patient presents with  . Medical Management of Chronic Issues    Routine Visit   Her medical management of chronic medical conditions including history of dementia-bilateral hip fractures-hypothyroidism-anemia-hypertension-  HPI:  Pt is a 83 y.o. female seen today for medical management of chronic diseases.  As noted above.  Patient has h/o Advanced dementia and Osteoporosis . She Had Sustained Right hip fracture which was followed by Left Hip fracture due to Fall in 07/16. Family decided not to get surgical repair. She has had Pain management in Facility. Including a Duragesic patch as well as Vicodin when necessary -  She has been started on Namenda by psych for a history of dementia.  She also has Ativan as necessary.  She is somewhat agitated tonight however she is getting changed which may be attributing to  this.  Nursing staff does not report any acute behaviors issues.  She does have a history of anemia she is on iron last hemoglobin show some stability and 11.5.  She also has a history of hypertension but is not currently on any medication recent blood pressures at been 130/66-110/48.  In regards hypothyroidism she is on Synthroid we will need to update her TSH.  It appears her weight is relatively stable 128 pounds  -   Past Medical History:  Diagnosis Date  . Dementia   . Osteoarthritis    Past Surgical History:  Procedure Laterality Date  . BREAST SURGERY    . EYE SURGERY    . HIP ARTHROPLASTY Right 01/01/2014   Procedure: BIPOLAR ARTHROPLASTY RIGHT HIP;  Surgeon: Carole Civil, MD;  Location: [facility];  Service: Orthopedics;  Laterality: Right;    Allergies  Allergen Reactions  . Morphine And Related     Increased confusion    Outpatient Encounter Prescriptions as of 01/16/2017  Medication Sig  . acetaminophen (TYLENOL) 325 MG suppository Place 325 mg rectally every 4 (four) hours as needed.  Marland Kitchen acetaminophen (TYLENOL) 500 MG tablet Take one to two tablets by mouth every 6 hours as needed for mild to moderate pain. DNE 3000mg  Tylenol in 24 hours from all sources.  . calcium-vitamin D (OSCAL WITH D) 500-200 MG-UNIT tablet Take 1 tablet by mouth 2 (two) times daily.  . citalopram (CELEXA) 10 MG tablet Take 20 mg by mouth daily.   Marland Kitchen Dextromethorphan-Guaifenesin 5-100 MG/5ML LIQD Give 15 ml by mouth every 6 hours prn  . fentaNYL (DURAGESIC - DOSED MCG/HR) 12 MCG/HR Apply one  patch topically as directed every 72 hours (every 3 days) note site. Remove old patch before applying new patch  . ferrous sulfate (KP FERROUS SULFATE) 325 (65 FE) MG tablet Take 325 mg by mouth 2 (two) times daily with a meal.  . HYDROcodone-acetaminophen (HYCET) 7.5-325 mg/15 ml solution Give 73ml by mouth every 6 hours as needed for pain. Max APAP 3gm/24 hours from all sources  . levothyroxine  (SYNTHROID, LEVOTHROID) 25 MCG tablet Take 25 mcg by mouth daily before breakfast.  . loratadine (CLARITIN) 10 MG tablet Take 10 mg by mouth daily.  Marland Kitchen LORazepam (ATIVAN) 0.5 MG tablet Take 1/2 tablet by mouth every 4 hours as needed for increased agitation, anxiety, behaviors x 30 days  . memantine (NAMENDA) 5 MG tablet Take 5 mg by mouth daily.  . NON FORMULARY Magic cup give once a  day  . Nutritional Supplements (ENSURE ENLIVE PO) One by mouth daily  . senna-docusate (SENOKOT-S) 8.6-50 MG per tablet Take 1 tablet by mouth at bedtime.  Marland Kitchen zinc oxide 20 % ointment Apply 1 application topically as needed for irritation.   No facility-administered encounter medications on file as of 01/16/2017.      Review of Systems   Essentially unattainable secondary to dementia please see history of present illness  Immunization History  Administered Date(s) Administered  . Influenza-Unspecified 05/10/2014, 05/02/2016  . Pneumococcal Polysaccharide-23 01/02/2014  . Tdap 01/01/2014   Pertinent  Health Maintenance Due  Topic Date Due  . PNA vac Low Risk Adult (2 of 2 - PCV13) 07/04/2017 (Originally 01/03/2015)  . INFLUENZA VACCINE  03/04/2017  . DEXA SCAN  Completed   No flowsheet data found. Functional Status Survey:    Vitals:   01/11/17 1425  BP: 130/66  Pulse: 67  Resp: 20  Temp: (!) 96.8 F (36 C)  TempSrc: Oral  SpO2: 95%  Weight: 128 lb (58.1 kg)  Height: 5\' 3"  (1.6 m)   Body mass index is 22.67 kg/m. Physical Exam In general this is a frail elderly female in no distress  But slightly agitated since she is receiving some personal-care this I suspect is not really new  Her skin is warm and dry there is well-healed surgical scar right hip.  Oropharynx appears clear mucous membranes moist.  Eyes appear reactive to light visual acuity appears grossly intact. She has prescription lenses  Chest is clear to auscultation there is no labored A breathing effort is somewhat  poor.  Heart is regular rate and rhythm without murmur gallop or rub she does not have significant lower extremity edema.  Abdomen soft nontender positive bowel sounds.  .  Muscleskeletal again history of bilateral hip fractures I do not note any acute deformities here she has general frailty with significant weakness of her lower extremities bilaterally there is again is a well-healed surgical scar on the right hip.  She currently has protective boots on bilaterally   Neurologic is grossly intact  Could not really appreciate any true lateralizing findings cranial nerves appear to be grossly intact  Psych she is oriented to self -findings consistent with severe dementia  Labs reviewed:  Recent Labs  04/15/16 0500 06/09/16 1623 09/05/16 0700  NA 140 137 138  K 4.0 4.8 4.0  CL 108 105 105  CO2 26 25 27   GLUCOSE 102* 105* 93  BUN 26* 27* 20  CREATININE 0.67 0.90 0.68  CALCIUM 9.1 8.5* 8.8*  MG 2.1  --   --     Recent Labs  04/15/16 0500  AST 18  ALT 14  ALKPHOS 55  BILITOT 0.4  PROT 6.6  ALBUMIN 3.6    Recent Labs  04/15/16 0500 06/09/16 1623 09/05/16 0700  WBC 4.7 8.5 4.2  NEUTROABS  --  5.2  --   HGB 12.0 11.3* 11.5*  HCT 37.1 35.3* 35.4*  MCV 95.1 95.9 96.7  PLT 193 226 187   Lab Results  Component Value Date   TSH 3.955 09/05/2016   No results found for: HGBA1C No results found for: CHOL, HDL, LDLCALC, LDLDIRECT, TRIG, CHOLHDL  Significant Diagnostic Results in last 30 days:  No results found.  Assessment/Plan    History of dementia-this continues to be quite significant however her weight appears to be stable and she appears to be doing well with supportive care.--Psych services has started her on Namenda  #2-history of bilateral hip fractures a poor candidate for any aggressive treatment left hip has been treated nonoperatively-pain appears controlled with the Duragesic patch as well as Vicodin when necessary she is on calcium  supplementation with history of osteoporosis  #3 history of hypothyroidism she is on Synthroid TSH has been in normal limits in the past but will recheck this.  #4-history of anemia she is on iron CBC is been relatively stable at 11.5 will update this.  #5-hypertension as noted above this appears stable she is not on any medication currently.  #6-depression she continues on Celexa-she does have Ativan when necessary for situational anxiety agitation.   .  #7-history of thrombocytopenia in the past again will update this last platelet count was within normal range 187,000 back in February 2018  Again will update a CBC to keep an eye on her anemia as well as a metabolic panel to keep an eye on her renal function-also will update a TSH with her history of hypothyroidism.  [id number]

## 2017-01-20 ENCOUNTER — Encounter (HOSPITAL_COMMUNITY)
Admission: RE | Admit: 2017-01-20 | Discharge: 2017-01-20 | Disposition: A | Discharge status: Home / Self Care | Payer: Medicare Other | Source: Skilled Nursing Facility | Attending: Internal Medicine | Admitting: Internal Medicine

## 2017-01-20 DIAGNOSIS — D649 Anemia, unspecified: Secondary | ICD-10-CM | POA: Diagnosis not present

## 2017-01-20 DIAGNOSIS — E039 Hypothyroidism, unspecified: Secondary | ICD-10-CM | POA: Insufficient documentation

## 2017-01-20 LAB — CBC WITH DIFFERENTIAL/PLATELET
BASOS PCT: 1 %
Basophils Absolute: 0 10*3/uL (ref 0.0–0.1)
EOS ABS: 0.1 10*3/uL (ref 0.0–0.7)
Eosinophils Relative: 1 %
HEMATOCRIT: 38.2 % (ref 36.0–46.0)
HEMOGLOBIN: 12.3 g/dL (ref 12.0–15.0)
LYMPHS ABS: 1.5 10*3/uL (ref 0.7–4.0)
Lymphocytes Relative: 35 %
MCH: 30.7 pg (ref 26.0–34.0)
MCHC: 32.2 g/dL (ref 30.0–36.0)
MCV: 95.3 fL (ref 78.0–100.0)
MONOS PCT: 13 %
Monocytes Absolute: 0.6 10*3/uL (ref 0.1–1.0)
NEUTROS ABS: 2.1 10*3/uL (ref 1.7–7.7)
NEUTROS PCT: 50 %
Platelets: 174 10*3/uL (ref 150–400)
RBC: 4.01 MIL/uL (ref 3.87–5.11)
RDW: 12.7 % (ref 11.5–15.5)
WBC: 4.2 10*3/uL (ref 4.0–10.5)

## 2017-01-20 LAB — BASIC METABOLIC PANEL
ANION GAP: 7 (ref 5–15)
BUN: 20 mg/dL (ref 6–20)
CHLORIDE: 105 mmol/L (ref 101–111)
CO2: 26 mmol/L (ref 22–32)
Calcium: 9.2 mg/dL (ref 8.9–10.3)
Creatinine, Ser: 0.63 mg/dL (ref 0.44–1.00)
Glucose, Bld: 96 mg/dL (ref 65–99)
POTASSIUM: 4 mmol/L (ref 3.5–5.1)
SODIUM: 138 mmol/L (ref 135–145)

## 2017-01-20 LAB — TSH: TSH: 4.207 u[IU]/mL (ref 0.350–4.500)

## 2017-01-21 ENCOUNTER — Encounter: Payer: Self-pay | Admitting: Internal Medicine

## 2017-01-21 ENCOUNTER — Non-Acute Institutional Stay (SKILLED_NURSING_FACILITY): Payer: Medicare Other | Admitting: Internal Medicine

## 2017-01-21 DIAGNOSIS — I10 Essential (primary) hypertension: Secondary | ICD-10-CM | POA: Diagnosis not present

## 2017-01-21 DIAGNOSIS — E039 Hypothyroidism, unspecified: Secondary | ICD-10-CM

## 2017-01-21 DIAGNOSIS — D649 Anemia, unspecified: Secondary | ICD-10-CM | POA: Diagnosis not present

## 2017-01-21 NOTE — Progress Notes (Signed)
Location:   [facility] Room Number: 141/W Place of Service:  SNF [id number]) Provider:  Freddi Starr, MD  Patient Care Team: Virgie Dad, MD as PCP - General (Internal Medicine)  Extended Emergency Contact Information Primary Emergency Contact: Noreene Larsson of [place] Phone: [phone] Mobile Phone: [phone] Relation: Niece Secondary Emergency Contact: Harvie Bridge States of [place] Phone: [phone] Mobile Phone: [phone] Relation: Sister  Code Status:  DNR Goals of care: Advanced Directive information Advanced Directives 01/21/2017  Does Patient Have a Medical Advance Directive? Yes  Type of Advance Directive Out of facility DNR (pink MOST or yellow form)  Does patient want to make changes to medical advance directive? No - Patient declined  Copy of [place] in Chart? -  Would patient like information on creating a medical advance directive? No - Patient declined  Pre-existing out of facility DNR order (yellow form or pink MOST form) -    Chief complaint-left great toe issues   HPI:  Pt is a 83 y.o. female seen today for an acute visit for concerns of possible cellulitis left great toe.  Apparently earlier today was noted to be some bloody drainage and erythema on the base of the toe.  I am following up on this.  Patient has significant dementia and cannot really give any review of systems apparently there's been no trauma to the area  She does complain of tenderness when the area is palpated  Nursing staff does not report some fever or sepsis.   Her other medical issues include history of bilateral hip fractureshypo thyroidism anemia and hypertension.Marland Kitchen  Updated labs show Hgb stable at 12.3--she is on iron-renal function appears stable with creatinine 0.63 BUN of 20 on lab done on June 19 as well.  TSH also was unremarkable at 11/03/2005 is relatively stable with  finding 4 months ago-she continues on Synthroid         Past Medical History:  Diagnosis Date  . Dementia   . Osteoarthritis    Past Surgical History:  Procedure Laterality Date  . BREAST SURGERY    . EYE SURGERY    . HIP ARTHROPLASTY Right 01/01/2014   Procedure: BIPOLAR ARTHROPLASTY RIGHT HIP;  Surgeon: Carole Civil, MD;  Location: [facility];  Service: Orthopedics;  Laterality: Right;    Allergies  Allergen Reactions  . Morphine And Related     Increased confusion    Outpatient Encounter Prescriptions as of 01/21/2017  Medication Sig  . acetaminophen (TYLENOL) 325 MG suppository Place 325 mg rectally every 4 (four) hours as needed.  Marland Kitchen acetaminophen (TYLENOL) 500 MG tablet Take one to two tablets by mouth every 6 hours as needed for mild to moderate pain. DNE 3000mg  Tylenol in 24 hours from all sources.  . calcium-vitamin D (OSCAL WITH D) 500-200 MG-UNIT tablet Take 1 tablet by mouth 2 (two) times daily.  . citalopram (CELEXA) 10 MG tablet Take 20 mg by mouth daily.   Marland Kitchen Dextromethorphan-Guaifenesin 5-100 MG/5ML LIQD Give 15 ml by mouth every 6 hours prn  . fentaNYL (DURAGESIC - DOSED MCG/HR) 12 MCG/HR Apply one patch topically as directed every 72 hours (every 3 days) note site. Remove old patch before applying new patch  . ferrous sulfate (KP FERROUS SULFATE) 325 (65 FE) MG tablet Take 325 mg by mouth 2 (two) times daily with a meal.  . HYDROcodone-acetaminophen (HYCET) 7.5-325 mg/15 ml solution Give 92ml by mouth every  6 hours as needed for pain. Max APAP 3gm/24 hours from all sources  . levothyroxine (SYNTHROID, LEVOTHROID) 25 MCG tablet Take 25 mcg by mouth daily before breakfast.  . loratadine (CLARITIN) 10 MG tablet Take 10 mg by mouth daily.  Marland Kitchen LORazepam (ATIVAN) 0.5 MG tablet Take 1/2 tablet by mouth every 4 hours as needed for increased agitation, anxiety, behaviors x 30 days  . memantine (NAMENDA) 5 MG tablet Take 5 mg by mouth daily.  . NON FORMULARY Magic cup  give once a  day  . Nutritional Supplements (ENSURE ENLIVE PO) One by mouth daily  . senna-docusate (SENOKOT-S) 8.6-50 MG per tablet Take 1 tablet by mouth at bedtime.  Marland Kitchen zinc oxide 20 % ointment Apply 1 application topically as needed for irritation.   No facility-administered encounter medications on file as of 01/21/2017.     Review of Systems   This is essentially unattainable secondary to dementia please see history of present illness  Immunization History  Administered Date(s) Administered  . Influenza-Unspecified 05/10/2014, 05/02/2016  . Pneumococcal Polysaccharide-23 01/02/2014  . Tdap 01/01/2014   Pertinent  Health Maintenance Due  Topic Date Due  . PNA vac Low Risk Adult (2 of 2 - PCV13) 07/04/2017 (Originally 01/03/2015)  . INFLUENZA VACCINE  03/04/2017  . DEXA SCAN  Completed   No flowsheet data found. Functional Status Survey:    She is afebrile pulse of 80 respirations 18 blood pressure  noted 100/60  Physical Exam  In general this is a frail elderly female who is in no distress resting in bed.  Her skin is warm and dry at the base of the left great toe there is just a  small raised erythematous area somewhat tender to palpation there is no surrounding erythema or drainage or active bleeding this does not really appear to be infected at this point--nail does appear to have some fungal changes.  Chest is clear to auscultation with poor effort there is no labored breathing.  Heart is regular rate and rhythm without murmur gallop or rub she does not have significant lower extremity edema.  Psych continues to be somewhat agitated with exam is at baseline  Labs reviewed:  Recent Labs  04/15/16 0500 06/09/16 1623 09/05/16 0700 01/20/17 0727  NA 140 137 138 138  K 4.0 4.8 4.0 4.0  CL 108 105 105 105  CO2 26 25 27 26   GLUCOSE 102* 105* 93 96  BUN 26* 27* 20 20  CREATININE 0.67 0.90 0.68 0.63  CALCIUM 9.1 8.5* 8.8* 9.2  MG 2.1  --   --   --     Recent  Labs  04/15/16 0500  AST 18  ALT 14  ALKPHOS 55  BILITOT 0.4  PROT 6.6  ALBUMIN 3.6    Recent Labs  06/09/16 1623 09/05/16 0700 01/20/17 0727  WBC 8.5 4.2 4.2  NEUTROABS 5.2  --  2.1  HGB 11.3* 11.5* 12.3  HCT 35.3* 35.4* 38.2  MCV 95.9 96.7 95.3  PLT 226 187 174   Lab Results  Component Value Date   TSH 4.207 01/20/2017   No results found for: HGBA1C No results found for: CHOL, HDL, LDLCALC, LDLDIRECT, TRIG, CHOLHDL  Significant Diagnostic Results in last 30 days:  No results found.  Assessment/Plan  #1-left great toe dermatitis-this does not appear to be cellulitic at this time this was assessed with the wound care nurse who is applying Silvadene at this point will monitor for any changes-per wound care this looks  much better than iearlier today --    2 anemia this appears stable to improved hemoglobin 12.3 on lab done yesterday-she continues on iron will monitor periodically.  #3 hypothyroidism continues on Synthroid TSH appears to be relatively baseline 4.207 will monitor periodically.  [id number]  [id number] t

## 2017-02-23 ENCOUNTER — Encounter: Payer: Self-pay | Admitting: Internal Medicine

## 2017-02-23 ENCOUNTER — Non-Acute Institutional Stay (SKILLED_NURSING_FACILITY): Payer: Medicare Other | Admitting: Internal Medicine

## 2017-02-23 DIAGNOSIS — M810 Age-related osteoporosis without current pathological fracture: Secondary | ICD-10-CM | POA: Diagnosis not present

## 2017-02-23 DIAGNOSIS — F329 Major depressive disorder, single episode, unspecified: Secondary | ICD-10-CM

## 2017-02-23 DIAGNOSIS — H44003 Unspecified purulent endophthalmitis, bilateral: Secondary | ICD-10-CM | POA: Diagnosis not present

## 2017-02-23 DIAGNOSIS — F0391 Unspecified dementia with behavioral disturbance: Secondary | ICD-10-CM | POA: Diagnosis not present

## 2017-02-23 DIAGNOSIS — E039 Hypothyroidism, unspecified: Secondary | ICD-10-CM | POA: Diagnosis not present

## 2017-02-23 DIAGNOSIS — F32A Depression, unspecified: Secondary | ICD-10-CM

## 2017-02-23 NOTE — Progress Notes (Signed)
Location:    [facility] Room Number: 141/W Place of Service:  SNF [phone]) Provider:  Virgie Dad, MD  Virgie Dad, MD  Patient Care Team: Virgie Dad, MD as PCP - General (Internal Medicine)  Extended Emergency Contact Information Primary Emergency Contact: Noreene Larsson of [place] Phone: [phone] Mobile Phone: [phone] Relation: Niece Secondary Emergency Contact: Harvie Bridge States of [place] Phone: [phone] Mobile Phone: [phone] Relation: Sister  Code Status:  Full Code Goals of care: Advanced Directive information Advanced Directives 02/23/2017  Does Patient Have a Medical Advance Directive? Yes  Type of Advance Directive Out of facility DNR (pink MOST or yellow form)  Does patient want to make changes to medical advance directive? No - Patient declined  Copy of [place] in Chart? -  Would patient like information on creating a medical advance directive? No - Patient declined  Pre-existing out of facility DNR order (yellow form or pink MOST form) -     Chief Complaint  Patient presents with  . Medical Management of Chronic Issues    Routine Visit  . Acute Visit    Eye Drainge    HPI:  Pt is a 83 y.o. female seen today for medical management of chronic diseases. And discharge from her eyes   Patient has h/o Advanced dementia and Osteoporosis . She Had Sustained Right hip fracture which was followed by Left Hip fracture due to Fall in 07/16. Family decided not to get surgical repair. She has been Pain management in Facility.  Patient was noticed to have some discharge from her eyes per Nurses she was having green discharge. No Itching noticed no pain. Patient unable to give any history. No other Nursing issues. Her pain is controlled per Nurses. Her weight is stable at 130 lbs.  Past Medical History:  Diagnosis Date  . Dementia   . Osteoarthritis    Past  Surgical History:  Procedure Laterality Date  . BREAST SURGERY    . EYE SURGERY    . HIP ARTHROPLASTY Right 01/01/2014   Procedure: BIPOLAR ARTHROPLASTY RIGHT HIP;  Surgeon: Carole Civil, MD;  Location: [facility];  Service: Orthopedics;  Laterality: Right;    Allergies  Allergen Reactions  . Morphine And Related     Increased confusion    Outpatient Encounter Prescriptions as of 02/23/2017  Medication Sig  . acetaminophen (TYLENOL) 325 MG suppository Place 325 mg rectally every 4 (four) hours as needed.  Marland Kitchen acetaminophen (TYLENOL) 500 MG tablet Take one to two tablets by mouth every 6 hours as needed for mild to moderate pain. DNE 3000mg  Tylenol in 24 hours from all sources.  . calcium-vitamin D (OSCAL WITH D) 500-200 MG-UNIT tablet Take 1 tablet by mouth 2 (two) times daily.  . citalopram (CELEXA) 10 MG tablet Take 20 mg by mouth daily.   Marland Kitchen Dextromethorphan-Guaifenesin 5-100 MG/5ML LIQD Give 15 ml by mouth every 6 hours prn  . fentaNYL (DURAGESIC - DOSED MCG/HR) 12 MCG/HR Apply one patch topically as directed every 72 hours (every 3 days) note site. Remove old patch before applying new patch  . ferrous sulfate (KP FERROUS SULFATE) 325 (65 FE) MG tablet Take 325 mg by mouth 2 (two) times daily with a meal.  . HYDROcodone-acetaminophen (HYCET) 7.5-325 mg/15 ml solution Give 38ml by mouth every 6 hours as needed for pain. Max APAP 3gm/24 hours from all sources  . levothyroxine (SYNTHROID, LEVOTHROID) 25 MCG tablet Take  25 mcg by mouth daily before breakfast.  . loratadine (CLARITIN) 10 MG tablet Take 10 mg by mouth daily.  Marland Kitchen LORazepam (ATIVAN) 0.5 MG tablet Take 1/2 tablet by mouth every 4 hours as needed for increased agitation, anxiety, behaviors x 30 days  . memantine (NAMENDA) 5 MG tablet Take 5 mg by mouth daily.  . NON FORMULARY Magic cup give once a  day  . Nutritional Supplements (ENSURE ENLIVE PO) One by mouth daily  . senna-docusate (SENOKOT-S) 8.6-50 MG per tablet Take 1 tablet  by mouth at bedtime.  . silver sulfADIAZINE (SILVADENE) 1 % cream Apply 1 application topically daily.  Marland Kitchen ZINC OXIDE, TOPICAL, 10 % CREA Apply to buttocks q shift prn for prevention of rash/skin breakdown every shift  . [DISCONTINUED] acetaminophen (TYLENOL) 325 MG suppository Place 325 mg rectally every 4 (four) hours as needed.  . [DISCONTINUED] zinc oxide 20 % ointment Apply 1 application topically as needed for irritation.   No facility-administered encounter medications on file as of 02/23/2017.     Review of Systems   Unable to perform ROS: Dementia   Immunization History  Administered Date(s) Administered  . Influenza-Unspecified 05/10/2014, 05/02/2016  . Pneumococcal Polysaccharide-23 01/02/2014  . Tdap 01/01/2014   Pertinent  Health Maintenance Due  Topic Date Due  . PNA vac Low Risk Adult (2 of 2 - PCV13) 07/04/2017 (Originally 01/03/2015)  . INFLUENZA VACCINE  03/04/2017  . DEXA SCAN  Completed   No flowsheet data found. Functional Status Survey:    Vitals:   02/23/17 1700  BP: 130/83  Pulse: 84  Resp: 20  Temp: 98.7 F (37.1 C)   There is no height or weight on file to calculate BMI. Physical Exam   She appears well-developedand well-nourished.  HENT:  Head: Normocephalic.  Eyes: Pupils are equal, round, and reactive to light. Some mild Conjunctivae redness Neck: Neck supple.  Cardiovascular: Normal rate, regular rhythmand normal heart sounds.  No murmurheard. Pulmonary/Chest: Effort normaland breath sounds normal.  Abdominal: Soft. Bowel sounds are normal. She exhibits no distension. There is no tenderness. There is no rebound.  Neurological: She is alert.  Patient did follow some commands. Psychiatric: Her behavior is normal.    Labs reviewed:  Recent Labs  04/15/16 0500 06/09/16 1623 09/05/16 0700 01/20/17 0727  NA 140 137 138 138  K 4.0 4.8 4.0 4.0  CL 108 105 105 105  CO2 26 25 27 26   GLUCOSE 102* 105* 93 96  BUN 26* 27* 20 20    CREATININE 0.67 0.90 0.68 0.63  CALCIUM 9.1 8.5* 8.8* 9.2  MG 2.1  --   --   --     Recent Labs  04/15/16 0500  AST 18  ALT 14  ALKPHOS 55  BILITOT 0.4  PROT 6.6  ALBUMIN 3.6    Recent Labs  06/09/16 1623 09/05/16 0700 01/20/17 0727  WBC 8.5 4.2 4.2  NEUTROABS 5.2  --  2.1  HGB 11.3* 11.5* 12.3  HCT 35.3* 35.4* 38.2  MCV 95.9 96.7 95.3  PLT 226 187 174   Lab Results  Component Value Date   TSH 4.207 01/20/2017   No results found for: HGBA1C No results found for: CHOL, HDL, LDLCALC, LDLDIRECT, TRIG, CHOLHDL  Significant Diagnostic Results in last 30 days:  No results found.  Assessment/Plan Eye Infection Bilateral Start her on Ocuflox Q ^ hours for 7 days.  Senile osteoporosis S/P B/L hip Fractures Pain controlled . Patient not candidate for aggressive therapy. On  calcium and Vit D. On fentanyl patch for pain control.  Dementia with behavioral disturbance,  Sometimes need Ativan for anxiety but stable otherwise. Continue supportive care Now also On Namenda  Hypothyroidism TSH normal in 06/18 Continue same dose.  Labs were reviewed  Family/ staff Communication:   Labs/tests ordered:     Total time spent in this patient care encounter was 25_ minutes; greater than 50% of the visit reviewing records for  patient and coordinating care for problems addressed at this encounter.

## 2017-03-05 ENCOUNTER — Other Ambulatory Visit: Payer: Self-pay

## 2017-03-05 MED ORDER — FENTANYL 12 MCG/HR TD PT72: MEDICATED_PATCH | TRANSDERMAL | 0 refills | Status: DC

## 2017-03-05 NOTE — Telephone Encounter (Signed)
[facility]

## 2017-03-13 ENCOUNTER — Other Ambulatory Visit: Payer: Self-pay

## 2017-03-13 MED ORDER — FENTANYL 12 MCG/HR TD PT72: MEDICATED_PATCH | TRANSDERMAL | 0 refills | Status: DC

## 2017-03-13 NOTE — Telephone Encounter (Signed)
RX Fax for [facility]@ [phone]  

## 2017-04-01 ENCOUNTER — Encounter: Payer: Self-pay | Admitting: Internal Medicine

## 2017-04-01 ENCOUNTER — Non-Acute Institutional Stay (SKILLED_NURSING_FACILITY): Payer: Medicare Other | Admitting: Internal Medicine

## 2017-04-01 DIAGNOSIS — M810 Age-related osteoporosis without current pathological fracture: Secondary | ICD-10-CM

## 2017-04-01 DIAGNOSIS — S72002A Fracture of unspecified part of neck of left femur, initial encounter for closed fracture: Secondary | ICD-10-CM

## 2017-04-01 DIAGNOSIS — E039 Hypothyroidism, unspecified: Secondary | ICD-10-CM | POA: Diagnosis not present

## 2017-04-01 DIAGNOSIS — F0391 Unspecified dementia with behavioral disturbance: Secondary | ICD-10-CM

## 2017-04-01 DIAGNOSIS — I10 Essential (primary) hypertension: Secondary | ICD-10-CM | POA: Diagnosis not present

## 2017-04-01 NOTE — Progress Notes (Signed)
Location:    [facility] Room Number: 141/W Place of Service:  SNF [phone]) Provider:  Freddi Starr, MD  Patient Care Team: Virgie Dad, MD as PCP - General (Internal Medicine)  Extended Emergency Contact Information Primary Emergency Contact: Noreene Larsson of [place] Phone: [phone] Mobile Phone: [phone] Relation: Niece Secondary Emergency Contact: Harvie Bridge States of [place] Phone: [phone] Mobile Phone: [phone] Relation: Sister  Code Status:  DNR Goals of care: Advanced Directive information Advanced Directives 04/01/2017  Does Patient Have a Medical Advance Directive? Yes  Type of Advance Directive Out of facility DNR (pink MOST or yellow form)  Does patient want to make changes to medical advance directive? No - Patient declined  Copy of [place] in Chart? -  Would patient like information on creating a medical advance directive? No - Patient declined  Pre-existing out of facility DNR order (yellow form or pink MOST form) -     Chief Complaint  Patient presents with  . Medical Management of Chronic Issues    Routine Visit  For medical management of chronic medical issues including dementia history of bilateral hip fractures hypothyroidism anemia allergic rhinitis hypertension osteoporosis   HPI:  Pt is a 83 y.o. female seen today for medical management of chronic diseases as noted above.  She appears to be having a period of relative stability weight appears to be relatively stable at 131 pounds apparently her intake is stable.  She has a osteoporosis with bilateral hip fractures initially right hip fracture that was followed by left hip fracture with a left hip fracture family opted for no surgery and conservative care with pain management she is currently on a Duragesic patch 12 g as well as Vicodin and Tylenol when necessary in this appears to be  effective she is resting in bed comfortably today.  Which is usually the case when I see her.  She does continue on calcium supplementation with a history of osteoporosis.  Regards to dementia she is on Namenda as well as Ativan on a when necessary basis occasionally there is agitation but appears this does not happen frequently and again appears to be doing well with supportive care she is on Celexa for coexistent depression.  He also has a history of hypothyroidism TSH was 4.2 on lab done in June 2018.  In regards to anemia with an element of iron deficiency her hemoglobin was 12.3 on June lab which is shown stability as well.  Most recent acute issue was some conjunctivitis which appears to have responded well to a topical eyedrops.  She also has a listed history of hypertension with blood pressures appear to be stable on no medications recent blood pressures 115/67-108/62-136/70.     Past Medical History:  Diagnosis Date  . Dementia   . Osteoarthritis    Past Surgical History:  Procedure Laterality Date  . BREAST SURGERY    . EYE SURGERY    . HIP ARTHROPLASTY Right 01/01/2014   Procedure: BIPOLAR ARTHROPLASTY RIGHT HIP;  Surgeon: Carole Civil, MD;  Location: [facility];  Service: Orthopedics;  Laterality: Right;    Allergies  Allergen Reactions  . Morphine And Related     Increased confusion    Outpatient Encounter Prescriptions as of 04/01/2017  Medication Sig  . acetaminophen (TYLENOL) 325 MG suppository Place 325 mg rectally every 4 (four) hours as needed.  Marland Kitchen acetaminophen (TYLENOL) 500 MG tablet Take one to  two tablets by mouth every 6 hours as needed for mild to moderate pain. DNE 3000mg  Tylenol in 24 hours from all sources.  . calcium-vitamin D (OSCAL WITH D) 500-200 MG-UNIT tablet Take 1 tablet by mouth 2 (two) times daily.  . citalopram (CELEXA) 10 MG tablet Take 20 mg by mouth daily.   Marland Kitchen Dextromethorphan-Guaifenesin 5-100 MG/5ML LIQD Give 15 ml by mouth every 6  hours prn  . fentaNYL (DURAGESIC - DOSED MCG/HR) 12 MCG/HR Apply one patch topically as directed every 72 hours (every 3 days) note site. Remove old patch before applying new patch  . ferrous sulfate (KP FERROUS SULFATE) 325 (65 FE) MG tablet Take 325 mg by mouth 2 (two) times daily with a meal.  . HYDROcodone-acetaminophen (HYCET) 7.5-325 mg/15 ml solution Give 32ml by mouth every 6 hours as needed for pain. Max APAP 3gm/24 hours from all sources  . levothyroxine (SYNTHROID, LEVOTHROID) 25 MCG tablet Take 25 mcg by mouth daily before breakfast.  . loratadine (CLARITIN) 10 MG tablet Take 10 mg by mouth daily.  Marland Kitchen LORazepam (ATIVAN) 0.5 MG tablet Take 1/2 tablet by mouth every 4 hours as needed for increased agitation, anxiety, behaviors x 30 days  . memantine (NAMENDA) 5 MG tablet Take 5 mg by mouth daily.  . NON FORMULARY Magic cup give once a  day  . Nutritional Supplements (ENSURE ENLIVE PO) One by mouth daily  . senna-docusate (SENOKOT-S) 8.6-50 MG per tablet Take 1 tablet by mouth at bedtime.  Marland Kitchen ZINC OXIDE, TOPICAL, 10 % CREA Apply to buttocks q shift prn for prevention of rash/skin breakdown every shift  . [DISCONTINUED] silver sulfADIAZINE (SILVADENE) 1 % cream Apply 1 application topically daily.   No facility-administered encounter medications on file as of 04/01/2017.      Review of Systems   This is unattainable secondary to dementia  Immunization History  Administered Date(s) Administered  . Influenza-Unspecified 05/10/2014, 05/02/2016  . Pneumococcal Polysaccharide-23 01/02/2014  . Tdap 01/01/2014   Pertinent  Health Maintenance Due  Topic Date Due  . INFLUENZA VACCINE  07/04/2017 (Originally 03/04/2017)  . PNA vac Low Risk Adult (2 of 2 - PCV13) 07/04/2017 (Originally 01/03/2015)  . DEXA SCAN  Completed   No flowsheet data found. Functional Status Survey:    Vitals:   04/01/17 1341  BP: 115/67  Pulse: 70  Resp: 20  Temp: (!) 96.9 F (36.1 C)  TempSrc: Oral  SpO2:  95%  Weight: 131 lb 3.2 oz (59.5 kg)  Height: 5\' 3"  (1.6 m)   Body mass index is 23.24 kg/m. Physical Exam   In general this is a somewhat frail elderly female in no distress lying comfortably in bed she does not really speak much today.  Her skin is warm and dry I do not see evidence of increased bruising or bleeding.  Eyes sclera and conjunctiva appear clear she has prescription lenses.  Visual acuity appears grossly intact she does make eye contact.  Oropharynx is clear mucous membranes moist.  Chest is clear to auscultation there is no labored breathing she has poor respiratory effort does not really follow verbal commands.  Heart is regular rate and rhythm without murmur gallop or rub she does not have significant lower extremity edema  Abdomen is soft nontender with active bowel sounds.  Muscles skeletal history of bilateral hip fractures in the left hip fracture was not surgically repaired do not note any overt deformities bilaterally of her hips she has protective boots on her lower extremities  with no edema has significant lower extremity weakness with a history of the fractures.  Neurologic is grossly intact she did not follow verbal commands which makes this a limited exam cranial nerves appear grossly intact again she does speak minimally did not really speak this afternoon.  Psych she has findings consistent with significant dementia which is baseline    Labs reviewed:  Recent Labs  04/15/16 0500 06/09/16 1623 09/05/16 0700 01/20/17 0727  NA 140 137 138 138  K 4.0 4.8 4.0 4.0  CL 108 105 105 105  CO2 26 25 27 26   GLUCOSE 102* 105* 93 96  BUN 26* 27* 20 20  CREATININE 0.67 0.90 0.68 0.63  CALCIUM 9.1 8.5* 8.8* 9.2  MG 2.1  --   --   --     Recent Labs  04/15/16 0500  AST 18  ALT 14  ALKPHOS 55  BILITOT 0.4  PROT 6.6  ALBUMIN 3.6    Recent Labs  06/09/16 1623 09/05/16 0700 01/20/17 0727  WBC 8.5 4.2 4.2  NEUTROABS 5.2  --  2.1  HGB 11.3*  11.5* 12.3  HCT 35.3* 35.4* 38.2  MCV 95.9 96.7 95.3  PLT 226 187 174   Lab Results  Component Value Date   TSH 4.207 01/20/2017   No results found for: HGBA1C No results found for: CHOL, HDL, LDLCALC, LDLDIRECT, TRIG, CHOLHDL  Significant Diagnostic Results in last 30 days:  No results found.  Assessment/Plan  #1-history of dementia this appears stable supportive care her weight has been stable she is on Namenda has Ativan as needed for situational anxiety.  She continues on Celexa for coexistent depression.  #2 history of bilateral hip fractures with surgical repair of the right again conservative care on the left at this point appears to be stable she is largely bedbound with emphasis on comfort she has protective boots on she is on calcium supplementation with a history of osteoporosis pain appears to be controlled with the Duragesic patch and when necessary Vicodin-Tylenol.  #3 hypothyroidism TSH was within normal limits back in June she is on Synthroid we will monitor at periodic intervals.  #4-history of anemia continues on iron hemoglobin has shown stability at 12.3 on lab done in June will monitor this periodically as well. His  #5 history of allergic rhinitis this is been relatively asymptomatic she is on Claritin.  #6 history hypertension no longer on meds nonetheless blood pressures as noted above appear to be stable.  [id number]

## 2017-04-14 ENCOUNTER — Other Ambulatory Visit: Payer: Self-pay

## 2017-04-14 MED ORDER — FENTANYL 12 MCG/HR TD PT72: MEDICATED_PATCH | TRANSDERMAL | 0 refills | Status: DC

## 2017-04-14 NOTE — Telephone Encounter (Signed)
RX Fax for [facility]@ [phone]  

## 2017-04-23 ENCOUNTER — Non-Acute Institutional Stay (SKILLED_NURSING_FACILITY): Payer: Medicare Other

## 2017-04-23 DIAGNOSIS — Z0000 Encounter for general adult medical examination without abnormal findings: Secondary | ICD-10-CM

## 2017-04-23 NOTE — Patient Instructions (Signed)
[name] , Thank you for taking time to come for your Medicare Wellness Visit. I appreciate your ongoing commitment to your health goals. Please review the following plan we discussed and let me know if I can assist you in the future.   Screening recommendations/referrals: Colonoscopy excluded, pt over age 81 Mammogram excluded, pt over age 58 Bone Density up to date Recommended yearly ophthalmology/optometry visit for glaucoma screening and checkup Recommended yearly dental visit for hygiene and checkup  Vaccinations: Influenza vaccine due Pneumococcal vaccine up to date Tdap vaccine up to date. Due 01/02/1924 Shingles vaccine not in records    Advanced directives: DNR in chart, copies of health care power of attorney and living will are needed   Conditions/risks identified: None  Next appointment: Dr. Lyndel Safe makes rounds   Preventive Care 65 Years and Older, Female Preventive care refers to lifestyle choices and visits with your health care provider that can promote health and wellness. What does preventive care include?  A yearly physical exam. This is also called an annual well check.  Dental exams once or twice a year.  Routine eye exams. Ask your health care provider how often you should have your eyes checked.  Personal lifestyle choices, including:  Daily care of your teeth and gums.  Regular physical activity.  Eating a healthy diet.  Avoiding tobacco and drug use.  Limiting alcohol use.  Practicing safe sex.  Taking low-dose aspirin every day.  Taking vitamin and mineral supplements as recommended by your health care provider. What happens during an annual well check? The services and screenings done by your health care provider during your annual well check will depend on your age, overall health, lifestyle risk factors, and family history of disease. Counseling  Your health care provider may ask you questions about your:  Alcohol use.  Tobacco  use.  Drug use.  Emotional well-being.  Home and relationship well-being.  Sexual activity.  Eating habits.  History of falls.  Memory and ability to understand (cognition).  Work and work Statistician.  Reproductive health. Screening  You may have the following tests or measurements:  Height, weight, and BMI.  Blood pressure.  Lipid and cholesterol levels. These may be checked every 5 years, or more frequently if you are over 37 years old.  Skin check.  Lung cancer screening. You may have this screening every year starting at age 30 if you have a 30-pack-year history of smoking and currently smoke or have quit within the past 15 years.  Fecal occult blood test (FOBT) of the stool. You may have this test every year starting at age 35.  Flexible sigmoidoscopy or colonoscopy. You may have a sigmoidoscopy every 5 years or a colonoscopy every 10 years starting at age 23.  Hepatitis C blood test.  Hepatitis B blood test.  Sexually transmitted disease (STD) testing.  Diabetes screening. This is done by checking your blood sugar (glucose) after you have not eaten for a while (fasting). You may have this done every 1-3 years.  Bone density scan. This is done to screen for osteoporosis. You may have this done starting at age 50.  Mammogram. This may be done every 1-2 years. Talk to your health care provider about how often you should have regular mammograms. Talk with your health care provider about your test results, treatment options, and if necessary, the need for more tests. Vaccines  Your health care provider may recommend certain vaccines, such as:  Influenza vaccine. This is recommended every  year.  Tetanus, diphtheria, and acellular pertussis (Tdap, Td) vaccine. You may need a Td booster every 10 years.  Zoster vaccine. You may need this after age 14.  Pneumococcal 13-valent conjugate (PCV13) vaccine. One dose is recommended after age 26.  Pneumococcal  polysaccharide (PPSV23) vaccine. One dose is recommended after age 82. Talk to your health care provider about which screenings and vaccines you need and how often you need them. This information is not intended to replace advice given to you by your health care provider. Make sure you discuss any questions you have with your health care provider. Document Released: 08/17/2015 Document Revised: 04/09/2016 Document Reviewed: 05/22/2015 Elsevier Interactive Patient Education  2017 [place] Prevention in the Home Falls can cause injuries. They can happen to people of all ages. There are many things you can do to make your home safe and to help prevent falls. What can I do on the outside of my home?  Regularly fix the edges of walkways and driveways and fix any cracks.  Remove anything that might make you trip as you walk through a door, such as a raised step or threshold.  Trim any bushes or trees on the path to your home.  Use bright outdoor lighting.  Clear any walking paths of anything that might make someone trip, such as rocks or tools.  Regularly check to see if handrails are loose or broken. Make sure that both sides of any steps have handrails.  Any raised decks and porches should have guardrails on the edges.  Have any leaves, snow, or ice cleared regularly.  Use sand or salt on walking paths during winter.  Clean up any spills in your garage right away. This includes oil or grease spills. What can I do in the bathroom?  Use night lights.  Install grab bars by the toilet and in the tub and shower. Do not use towel bars as grab bars.  Use non-skid mats or decals in the tub or shower.  If you need to sit down in the shower, use a plastic, non-slip stool.  Keep the floor dry. Clean up any water that spills on the floor as soon as it happens.  Remove soap buildup in the tub or shower regularly.  Attach bath mats securely with double-sided non-slip rug  tape.  Do not have throw rugs and other things on the floor that can make you trip. What can I do in the bedroom?  Use night lights.  Make sure that you have a light by your bed that is easy to reach.  Do not use any sheets or blankets that are too big for your bed. They should not hang down onto the floor.  Have a firm chair that has side arms. You can use this for support while you get dressed.  Do not have throw rugs and other things on the floor that can make you trip. What can I do in the kitchen?  Clean up any spills right away.  Avoid walking on wet floors.  Keep items that you use a lot in easy-to-reach places.  If you need to reach something above you, use a strong step stool that has a grab bar.  Keep electrical cords out of the way.  Do not use floor polish or wax that makes floors slippery. If you must use wax, use non-skid floor wax.  Do not have throw rugs and other things on the floor that can make you trip. What can  I do with my stairs?  Do not leave any items on the stairs.  Make sure that there are handrails on both sides of the stairs and use them. Fix handrails that are broken or loose. Make sure that handrails are as long as the stairways.  Check any carpeting to make sure that it is firmly attached to the stairs. Fix any carpet that is loose or worn.  Avoid having throw rugs at the top or bottom of the stairs. If you do have throw rugs, attach them to the floor with carpet tape.  Make sure that you have a light switch at the top of the stairs and the bottom of the stairs. If you do not have them, ask someone to add them for you. What else can I do to help prevent falls?  Wear shoes that:  Do not have high heels.  Have rubber bottoms.  Are comfortable and fit you well.  Are closed at the toe. Do not wear sandals.  If you use a stepladder:  Make sure that it is fully opened. Do not climb a closed stepladder.  Make sure that both sides of the  stepladder are locked into place.  Ask someone to hold it for you, if possible.  Clearly mark and make sure that you can see:  Any grab bars or handrails.  First and last steps.  Where the edge of each step is.  Use tools that help you move around (mobility aids) if they are needed. These include:  Canes.  Walkers.  Scooters.  Crutches.  Turn on the lights when you go into a dark area. Replace any light bulbs as soon as they burn out.  Set up your furniture so you have a clear path. Avoid moving your furniture around.  If any of your floors are uneven, fix them.  If there are any pets around you, be aware of where they are.  Review your medicines with your doctor. Some medicines can make you feel dizzy. This can increase your chance of falling. Ask your doctor what other things that you can do to help prevent falls. This information is not intended to replace advice given to you by your health care provider. Make sure you discuss any questions you have with your health care provider. Document Released: 05/17/2009 Document Revised: 12/27/2015 Document Reviewed: 08/25/2014 Elsevier Interactive Patient Education  2017 [employer].

## 2017-04-23 NOTE — Progress Notes (Signed)
Subjective:   [name] is a 83 y.o. female who presents for an Initial Medicare Annual Wellness Visit at [facility];incapacitated patient unable to answer questions appropriately        Objective:    Today's Vitals   04/23/17 1526  BP: (!) 118/52  Pulse: 70  Temp: 97.7 F (36.5 C)  TempSrc: Oral  SpO2: 97%  Weight: 131 lb (59.4 kg)  Height: 5\' 3"  (1.6 m)   Body mass index is 23.21 kg/m.   Current Medications (verified) Outpatient Encounter Prescriptions as of 04/23/2017  Medication Sig  . acetaminophen (TYLENOL) 325 MG suppository Place 325 mg rectally every 4 (four) hours as needed.  Marland Kitchen acetaminophen (TYLENOL) 500 MG tablet Take one to two tablets by mouth every 6 hours as needed for mild to moderate pain. DNE 3000mg  Tylenol in 24 hours from all sources.  . calcium-vitamin D (OSCAL WITH D) 500-200 MG-UNIT tablet Take 1 tablet by mouth 2 (two) times daily.  . citalopram (CELEXA) 10 MG tablet Take 20 mg by mouth daily.   Marland Kitchen Dextromethorphan-Guaifenesin 5-100 MG/5ML LIQD Give 15 ml by mouth every 6 hours prn  . fentaNYL (DURAGESIC - DOSED MCG/HR) 12 MCG/HR Apply one patch topically as directed every 72 hours (every 3 days) note site. Remove old patch before applying new patch  . ferrous sulfate (KP FERROUS SULFATE) 325 (65 FE) MG tablet Take 325 mg by mouth 2 (two) times daily with a meal.  . HYDROcodone-acetaminophen (HYCET) 7.5-325 mg/15 ml solution Give 34ml by mouth every 6 hours as needed for pain. Max APAP 3gm/24 hours from all sources  . levothyroxine (SYNTHROID, LEVOTHROID) 25 MCG tablet Take 25 mcg by mouth daily before breakfast.  . loratadine (CLARITIN) 10 MG tablet Take 10 mg by mouth daily.  . memantine (NAMENDA) 5 MG tablet Take 5 mg by mouth daily.  . NON FORMULARY Magic cup give once a  day  . Nutritional Supplements (ENSURE ENLIVE PO) One by mouth daily  . senna-docusate (SENOKOT-S) 8.6-50 MG per tablet Take 1 tablet by mouth at  bedtime.  Marland Kitchen ZINC OXIDE, TOPICAL, 10 % CREA Apply to buttocks q shift prn for prevention of rash/skin breakdown every shift  . [DISCONTINUED] LORazepam (ATIVAN) 0.5 MG tablet Take 1/2 tablet by mouth every 4 hours as needed for increased agitation, anxiety, behaviors x 30 days   No facility-administered encounter medications on file as of 04/23/2017.     Allergies (verified) Morphine and related   History: Past Medical History:  Diagnosis Date  . Dementia   . Osteoarthritis    Past Surgical History:  Procedure Laterality Date  . BREAST SURGERY    . EYE SURGERY    . HIP ARTHROPLASTY Right 01/01/2014   Procedure: BIPOLAR ARTHROPLASTY RIGHT HIP;  Surgeon: Carole Civil, MD;  Location: [facility];  Service: Orthopedics;  Laterality: Right;   History reviewed. No pertinent family history. Social History   Occupational History  .  Retired   Social History Main Topics  . Smoking status: Never Smoker  . Smokeless tobacco: Never Used  . Alcohol use No  . Drug use: No  . Sexual activity: No    Tobacco Counseling Counseling given: Not Answered   Activities of Daily Living In your present state of health, do you have any difficulty performing the following activities: 04/23/2017  Hearing? N  Vision? N  Difficulty concentrating or making decisions? Y  Walking or climbing stairs? Y  Dressing or  bathing? Y  Doing errands, shopping? Y  Preparing Food and eating ? Y  Using the Toilet? Y  In the past six months, have you accidently leaked urine? Y  Do you have problems with loss of bowel control? Y  Managing your Medications? Y  Managing your Finances? Y  Housekeeping or managing your Housekeeping? Y  Some recent data might be hidden    Immunizations and Health Maintenance Immunization History  Administered Date(s) Administered  . Influenza-Unspecified 05/10/2014, 05/02/2016  . Pneumococcal Polysaccharide-23 01/02/2014  . Tdap 01/01/2014   There are no preventive care  reminders to display for this patient.  Patient Care Team: Virgie Dad, MD as PCP - General (Internal Medicine)  Indicate any recent Medical Services you may have received from other than [facility] providers in the past year (date may be approximate).     Assessment:   This is a routine wellness examination for [name].   Hearing/Vision screen No exam data present  Dietary issues and exercise activities discussed: Current Exercise Habits: The patient does not participate in regular exercise at present, Exercise limited by: neurologic condition(s)  Goals    None     Depression Screen PHQ 2/9 Scores 04/23/2017  PHQ - 2 Score 0    Fall Risk Fall Risk  04/23/2017  Falls in the past year? No    Cognitive Function: MMSE - Mini Mental State Exam 04/23/2017 09/28/2014  Not completed: Unable to complete -  Orientation to time - 0  Orientation to Place - 1  Registration - 0  Attention/ Calculation - 0  Recall - 0  Language- name 2 objects - 2  Language- repeat - 0  Language- follow 3 step command - 0  Language- read & follow direction - 0  Write a sentence - 0  Copy design - 0  Total score - 3        Screening Tests Health Maintenance  Topic Date Due  . INFLUENZA VACCINE  07/04/2017 (Originally 03/04/2017)  . PNA vac Low Risk Adult (2 of 2 - PCV13) 07/04/2017 (Originally 01/03/2015)  . TETANUS/TDAP  01/02/2024  . DEXA SCAN  Completed      Plan:    I have personally reviewed and addressed the Medicare Annual Wellness questionnaire and have noted the following in the patient's chart:  A. Medical and social history B. Use of alcohol, tobacco or illicit drugs  C. Current medications and supplements D. Functional ability and status E.  Nutritional status F.  Physical activity G. Advance directives H. List of other physicians I.  Hospitalizations, surgeries, and ER visits in previous 12 months J.  [place] to include hearing, vision, cognitive,  depression L. Referrals and appointments - none  In addition, I am unable to review and discuss with incapacitated patient certain preventive protocols, quality metrics, and best practice recommendations. A written personalized care plan for preventive services as well as general preventive health recommendations were provided to patient.   See attached scanned questionnaire for additional information.   Signed,   Rich Reining, RN Nurse Health Advisor   Quick Notes   Health Maintenance: Flu vaccine needed.     Abnormal Screen: Unable to complete mental exam     Patient Concerns: None     Nurse Concerns: None

## 2017-04-27 DIAGNOSIS — F060 Psychotic disorder with hallucinations due to known physiological condition: Secondary | ICD-10-CM | POA: Diagnosis not present

## 2017-04-29 DIAGNOSIS — M2041 Other hammer toe(s) (acquired), right foot: Secondary | ICD-10-CM | POA: Diagnosis not present

## 2017-04-29 DIAGNOSIS — B351 Tinea unguium: Secondary | ICD-10-CM | POA: Diagnosis not present

## 2017-04-29 DIAGNOSIS — M79675 Pain in left toe(s): Secondary | ICD-10-CM | POA: Diagnosis not present

## 2017-04-29 DIAGNOSIS — M79674 Pain in right toe(s): Secondary | ICD-10-CM | POA: Diagnosis not present

## 2017-05-12 ENCOUNTER — Encounter: Payer: Self-pay | Admitting: Internal Medicine

## 2017-05-12 ENCOUNTER — Non-Acute Institutional Stay (SKILLED_NURSING_FACILITY): Payer: Medicare Other | Admitting: Internal Medicine

## 2017-05-12 DIAGNOSIS — M810 Age-related osteoporosis without current pathological fracture: Secondary | ICD-10-CM | POA: Diagnosis not present

## 2017-05-12 DIAGNOSIS — F0391 Unspecified dementia with behavioral disturbance: Secondary | ICD-10-CM

## 2017-05-12 DIAGNOSIS — E039 Hypothyroidism, unspecified: Secondary | ICD-10-CM

## 2017-05-12 DIAGNOSIS — I10 Essential (primary) hypertension: Secondary | ICD-10-CM | POA: Diagnosis not present

## 2017-05-12 DIAGNOSIS — F32A Depression, unspecified: Secondary | ICD-10-CM

## 2017-05-12 DIAGNOSIS — F329 Major depressive disorder, single episode, unspecified: Secondary | ICD-10-CM | POA: Diagnosis not present

## 2017-05-12 NOTE — Progress Notes (Signed)
Location:   [facility] Room Number: 141/W Place of Service:  SNF [phone]) Provider:  Kyung Rudd, Rene Kocher, MD  Patient Care Team: Virgie Dad, MD as PCP - General (Internal Medicine)  Extended Emergency Contact Information Primary Emergency Contact: Noreene Larsson of [place] Phone: [phone] Mobile Phone: [phone] Relation: Niece Secondary Emergency Contact: Harvie Bridge [place] of [place] Phone: [phone] Mobile Phone: [phone] Relation: Sister  Code Status:  DNR Goals of care: Advanced Directive information Advanced Directives 05/12/2017  Does Patient Have a Medical Advance Directive? Yes  Type of Advance Directive Out of facility DNR (pink MOST or yellow form)  Does patient want to make changes to medical advance directive? No - Patient declined  Copy of [place] in Chart? -  Would patient like information on creating a medical advance directive? No - Patient declined  Pre-existing out of facility DNR order (yellow form or pink MOST form) -     Chief Complaint  Patient presents with  . Medical Management of Chronic Issues    Routine Visit    HPI:  Pt is a 83 y.o. female seen today for medical management of chronic diseases.    Patient has h/o Advanced dementia and Osteoporosis . She Had Sustained Right hip fracture which was followed by Left Hip fracture due to Fall in 07/16. Family decided not to get surgical repair. She has been Pain management in Facility.  Patient is doing well in facility. Her sitter was in the room. Per sitter patient has one episode of anxiety few days ago and responded well to Pain meds. But otherwise she is doing well. Her weight is stable at 132 lbs. Her pain is controlled and her appetite is fair. Per Nurses no new Nursing issues. Past Medical History:  Diagnosis Date  . Dementia   . Osteoarthritis    Past Surgical History:  Procedure  Laterality Date  . BREAST SURGERY    . EYE SURGERY    . HIP ARTHROPLASTY Right 01/01/2014   Procedure: BIPOLAR ARTHROPLASTY RIGHT HIP;  Surgeon: Carole Civil, MD;  Location: [facility];  Service: Orthopedics;  Laterality: Right;    Allergies  Allergen Reactions  . Morphine And Related     Increased confusion    Outpatient Encounter Prescriptions as of 05/12/2017  Medication Sig  . acetaminophen (TYLENOL) 325 MG suppository Place 325 mg rectally every 4 (four) hours as needed.  Marland Kitchen acetaminophen (TYLENOL) 500 MG tablet Take one to two tablets by mouth every 6 hours as needed for mild to moderate pain. DNE 3000mg  Tylenol in 24 hours from all sources.  . calcium-vitamin D (OSCAL WITH D) 500-200 MG-UNIT tablet Take 1 tablet by mouth 2 (two) times daily.  . citalopram (CELEXA) 10 MG tablet Take 20 mg by mouth daily.   Marland Kitchen Dextromethorphan-Guaifenesin 5-100 MG/5ML LIQD Give 15 ml by mouth every 6 hours prn  . fentaNYL (DURAGESIC - DOSED MCG/HR) 12 MCG/HR Apply one patch topically as directed every 72 hours (every 3 days) note site. Remove old patch before applying new patch  . ferrous sulfate (KP FERROUS SULFATE) 325 (65 FE) MG tablet Take 325 mg by mouth 2 (two) times daily with a meal.  . HYDROcodone-acetaminophen (HYCET) 7.5-325 mg/15 ml solution Give 70ml by mouth every 6 hours as needed for pain. Max APAP 3gm/24 hours from all sources  . levothyroxine (SYNTHROID, LEVOTHROID) 25 MCG tablet Take 25 mcg by mouth daily before breakfast.  .  loratadine (CLARITIN) 10 MG tablet Take 10 mg by mouth daily.  . memantine (NAMENDA) 5 MG tablet Take 5 mg by mouth daily.  . NON FORMULARY Magic cup give once a  day  . Nutritional Supplements (ENSURE ENLIVE PO) One by mouth daily  . senna-docusate (SENOKOT-S) 8.6-50 MG per tablet Take 1 tablet by mouth at bedtime.  Marland Kitchen ZINC OXIDE, TOPICAL, 10 % CREA Apply to buttocks q shift prn for prevention of rash/skin breakdown every shift   No facility-administered  encounter medications on file as of 05/12/2017.      Review of Systems  Unable to perform ROS: Dementia    C  Immunization History  Administered Date(s) Administered  . Influenza-Unspecified 05/10/2014, 05/02/2016  . Pneumococcal Polysaccharide-23 01/02/2014  . Tdap 01/01/2014   Pertinent  Health Maintenance Due  Topic Date Due  . INFLUENZA VACCINE  07/04/2017 (Originally 03/04/2017)  . DEXA SCAN  Completed  . PNA vac Low Risk Adult  Completed   Fall Risk  04/23/2017  Falls in the past year? No   Functional Status Survey:    Vitals:   05/12/17 0929  BP: (!) 141/71  Pulse: 81  Resp: 16  Temp: 98.5 F (36.9 C)  TempSrc: Oral  SpO2: 95%  Weight: 132 lb 1.6 oz (59.9 kg)  Height: 5\' 3"  (1.6 m)   Body mass index is 23.4 kg/m. Physical Exam  Constitutional: She appears well-developed and well-nourished.  HENT:  Mouth/Throat: Oropharynx is clear and moist.  Poor teeth hygeine  Eyes: Pupils are equal, round, and reactive to light.  Neck: Neck supple.  Cardiovascular: Normal rate and normal heart sounds.   No murmur heard. Pulmonary/Chest: Effort normal and breath sounds normal. No respiratory distress. She has no wheezes. She has no rales.  Abdominal: Soft. Bowel sounds are normal. She exhibits no distension. There is no tenderness. There is no rebound.  Musculoskeletal:  Trace Edema Bilateral  Neurological: She is alert.  Doe snot follow any commands  Skin: Skin is warm and dry.  Psychiatric: She has a normal mood and affect.    Labs reviewed:  Recent Labs  06/09/16 1623 09/05/16 0700 01/20/17 0727  NA 137 138 138  K 4.8 4.0 4.0  CL 105 105 105  CO2 25 27 26   GLUCOSE 105* 93 96  BUN 27* 20 20  CREATININE 0.90 0.68 0.63  CALCIUM 8.5* 8.8* 9.2   No results for input(s): AST, ALT, ALKPHOS, BILITOT, PROT, ALBUMIN in the last 8760 hours.  Recent Labs  06/09/16 1623 09/05/16 0700 01/20/17 0727  WBC 8.5 4.2 4.2  NEUTROABS 5.2  --  2.1  HGB 11.3* 11.5*  12.3  HCT 35.3* 35.4* 38.2  MCV 95.9 96.7 95.3  PLT 226 187 174   Lab Results  Component Value Date   TSH 4.207 01/20/2017   No results found for: HGBA1C No results found for: CHOL, HDL, LDLCALC, LDLDIRECT, TRIG, CHOLHDL  Significant Diagnostic Results in last 30 days:  No results found.  Assessment/Plan  Hypothyroidism TSH was WNL in 06/18 Repeat TSH.  continue same dose of Synthroid  Dementia with behavioral disturbance On [place] Does not need any Antipsychotics right now.  Essential hypertension Not on any meds. Stable  Senile osteoporosis No Aggressive treatment Pain Control for Femoral fractures.  Depression Continue Celexa     Family/ staff Communication:   Labs/tests ordered:  BMP, CBC, TSH  Total time spent in this patient care encounter was 25_ minutes; greater than 50% of the visit  spent reviewing records , Labs and coordinating care for problems addressed at this encounter.

## 2017-05-13 ENCOUNTER — Encounter (HOSPITAL_COMMUNITY)
Admission: RE | Admit: 2017-05-13 | Discharge: 2017-05-13 | Disposition: A | Discharge status: Home / Self Care | Payer: Medicare Other | Source: Skilled Nursing Facility | Attending: Internal Medicine | Admitting: Internal Medicine

## 2017-05-13 DIAGNOSIS — Z8781 Personal history of (healed) traumatic fracture: Secondary | ICD-10-CM | POA: Insufficient documentation

## 2017-05-13 DIAGNOSIS — F0390 Unspecified dementia without behavioral disturbance: Secondary | ICD-10-CM | POA: Insufficient documentation

## 2017-05-13 DIAGNOSIS — I10 Essential (primary) hypertension: Secondary | ICD-10-CM | POA: Diagnosis not present

## 2017-05-13 DIAGNOSIS — F411 Generalized anxiety disorder: Secondary | ICD-10-CM | POA: Diagnosis not present

## 2017-05-13 DIAGNOSIS — E039 Hypothyroidism, unspecified: Secondary | ICD-10-CM | POA: Insufficient documentation

## 2017-05-13 LAB — COMPREHENSIVE METABOLIC PANEL
ALK PHOS: 56 U/L (ref 38–126)
ALT: 12 U/L — AB (ref 14–54)
AST: 19 U/L (ref 15–41)
Albumin: 3.6 g/dL (ref 3.5–5.0)
Anion gap: 10 (ref 5–15)
BILIRUBIN TOTAL: 0.5 mg/dL (ref 0.3–1.2)
BUN: 19 mg/dL (ref 6–20)
CALCIUM: 9 mg/dL (ref 8.9–10.3)
CO2: 23 mmol/L (ref 22–32)
CREATININE: 0.66 mg/dL (ref 0.44–1.00)
Chloride: 107 mmol/L (ref 101–111)
GFR calc non Af Amer: >60 mL/min (ref >60)
Glucose, Bld: 98 mg/dL (ref 65–99)
Potassium: 4.1 mmol/L (ref 3.5–5.1)
SODIUM: 140 mmol/L (ref 135–145)
TOTAL PROTEIN: 6.8 g/dL (ref 6.5–8.1)

## 2017-05-13 LAB — CBC
HCT: 40.4 % (ref 36.0–46.0)
Hemoglobin: 12.9 g/dL (ref 12.0–15.0)
MCH: 31.4 pg (ref 26.0–34.0)
MCHC: 31.9 g/dL (ref 30.0–36.0)
MCV: 98.3 fL (ref 78.0–100.0)
PLATELETS: 173 10*3/uL (ref 150–400)
RBC: 4.11 MIL/uL (ref 3.87–5.11)
RDW: 12.7 % (ref 11.5–15.5)
WBC: 5.1 10*3/uL (ref 4.0–10.5)

## 2017-05-14 ENCOUNTER — Encounter (HOSPITAL_COMMUNITY)
Admission: RE | Admit: 2017-05-14 | Discharge: 2017-05-14 | Disposition: A | Discharge status: Home / Self Care | Payer: Medicare Other | Source: Skilled Nursing Facility | Attending: *Deleted | Admitting: *Deleted

## 2017-05-14 DIAGNOSIS — F411 Generalized anxiety disorder: Secondary | ICD-10-CM | POA: Diagnosis not present

## 2017-05-14 DIAGNOSIS — E039 Hypothyroidism, unspecified: Secondary | ICD-10-CM | POA: Diagnosis not present

## 2017-05-14 DIAGNOSIS — F0390 Unspecified dementia without behavioral disturbance: Secondary | ICD-10-CM | POA: Diagnosis not present

## 2017-05-14 DIAGNOSIS — Z8781 Personal history of (healed) traumatic fracture: Secondary | ICD-10-CM | POA: Diagnosis not present

## 2017-05-14 DIAGNOSIS — I10 Essential (primary) hypertension: Secondary | ICD-10-CM | POA: Diagnosis not present

## 2017-05-14 LAB — TSH: TSH: 6.61 u[IU]/mL — ABNORMAL HIGH (ref 0.350–4.500)

## 2017-06-24 ENCOUNTER — Other Ambulatory Visit: Payer: Self-pay

## 2017-06-24 MED ORDER — FENTANYL 12 MCG/HR TD PT72: MEDICATED_PATCH | TRANSDERMAL | 0 refills | Status: DC

## 2017-06-24 NOTE — Telephone Encounter (Signed)
RX Fax for [facility]@ [phone]  

## 2017-06-26 ENCOUNTER — Encounter (HOSPITAL_COMMUNITY)
Admission: RE | Admit: 2017-06-26 | Discharge: 2017-06-26 | Disposition: A | Discharge status: Home / Self Care | Payer: Medicare Other | Source: Skilled Nursing Facility | Attending: Internal Medicine | Admitting: Internal Medicine

## 2017-06-26 DIAGNOSIS — E039 Hypothyroidism, unspecified: Secondary | ICD-10-CM | POA: Diagnosis not present

## 2017-06-26 DIAGNOSIS — F0390 Unspecified dementia without behavioral disturbance: Secondary | ICD-10-CM | POA: Diagnosis not present

## 2017-06-26 DIAGNOSIS — I10 Essential (primary) hypertension: Secondary | ICD-10-CM | POA: Insufficient documentation

## 2017-06-26 DIAGNOSIS — Z8781 Personal history of (healed) traumatic fracture: Secondary | ICD-10-CM | POA: Diagnosis not present

## 2017-06-26 DIAGNOSIS — F411 Generalized anxiety disorder: Secondary | ICD-10-CM | POA: Diagnosis not present

## 2017-06-26 LAB — TSH: TSH: 4.047 u[IU]/mL (ref 0.350–4.500)

## 2017-07-02 DIAGNOSIS — F0391 Unspecified dementia with behavioral disturbance: Secondary | ICD-10-CM | POA: Diagnosis not present

## 2017-07-02 DIAGNOSIS — F4323 Adjustment disorder with mixed anxiety and depressed mood: Secondary | ICD-10-CM | POA: Diagnosis not present

## 2017-07-06 ENCOUNTER — Non-Acute Institutional Stay (SKILLED_NURSING_FACILITY): Payer: Medicare Other | Admitting: Internal Medicine

## 2017-07-06 ENCOUNTER — Encounter: Payer: Self-pay | Admitting: Internal Medicine

## 2017-07-06 DIAGNOSIS — F0391 Unspecified dementia with behavioral disturbance: Secondary | ICD-10-CM

## 2017-07-06 DIAGNOSIS — M810 Age-related osteoporosis without current pathological fracture: Secondary | ICD-10-CM | POA: Diagnosis not present

## 2017-07-06 DIAGNOSIS — I10 Essential (primary) hypertension: Secondary | ICD-10-CM | POA: Diagnosis not present

## 2017-07-06 DIAGNOSIS — S72002A Fracture of unspecified part of neck of left femur, initial encounter for closed fracture: Secondary | ICD-10-CM

## 2017-07-06 DIAGNOSIS — E039 Hypothyroidism, unspecified: Secondary | ICD-10-CM

## 2017-07-06 NOTE — Progress Notes (Signed)
Location:   [facility] Room Number: 141/W Place of Service:  SNF [phone]) Provider:  Freddi Starr, MD  Patient Care Team: Virgie Dad, MD as PCP - General (Internal Medicine)  Extended Emergency Contact Information Primary Emergency Contact: Noreene Larsson of [place] Phone: [phone] Mobile Phone: [phone] Relation: Niece Secondary Emergency Contact: Harvie Bridge States of [place] Phone: [phone] Mobile Phone: [phone] Relation: Sister  Code Status:  DNR Goals of care: Advanced Directive information Advanced Directives 07/06/2017  Does Patient Have a Medical Advance Directive? Yes  Type of Advance Directive Out of facility DNR (pink MOST or yellow form)  Does patient want to make changes to medical advance directive? No - Patient declined  Copy of [place] in Chart? No - copy requested  Would patient like information on creating a medical advance directive? No - Patient declined  Pre-existing out of facility DNR order (yellow form or pink MOST form) -     Chief Complaint  Patient presents with  . Medical Management of Chronic Issues    Routine Visit  Medical management of chronic medical conditions including history of right as well as left hip fractures-hypothyroidism-dementia-osteoporosis- constipation as noted above.    HPI:  Pt is a 83 y.o. female seen today for medical management of chronic diseases.  As noted above.  She has a history of advanced dementia as well as osteoporosis-initially she had a right hip fracture followed by left hip fracture secondary to a fall in July 2016-family has decided not to do surgical repair is again is management he is on a Duragesic patch as well as Vicodin as needed.  Her weight continues to be stable at about 130 pounds she appears to be doing well with supportive care-today she is resting comfortably in her [place]  chair.  Nursing staff does not report any acute issues.  She does have a history of hypothyroidism she is on Synthroid TSH was within normal limits at   4.047 back in November.  .  Regards osteoporosis she is on with vitamin D.  She essentially unobtainable secondary to dementia please see HPI is also on Celexa for coexistent depression    Past Medical History:  Diagnosis Date  . Dementia   . Osteoarthritis    Past Surgical History:  Procedure Laterality Date  . BREAST SURGERY    . EYE SURGERY    . HIP ARTHROPLASTY Right 01/01/2014   Procedure: BIPOLAR ARTHROPLASTY RIGHT HIP;  Surgeon: Carole Civil, MD;  Location: [facility];  Service: Orthopedics;  Laterality: Right;    Allergies  Allergen Reactions  . Morphine And Related     Increased confusion    Outpatient Encounter Medications as of 07/06/2017  Medication Sig  . acetaminophen (TYLENOL) 325 MG suppository Place 325 mg rectally every 4 (four) hours as needed.  Marland Kitchen acetaminophen (TYLENOL) 500 MG tablet Take one to two tablets by mouth every 6 hours as needed for mild to moderate pain. DNE 3000mg  Tylenol in 24 hours from all sources.  . calcium-vitamin D (OSCAL WITH D) 500-200 MG-UNIT tablet Take 1 tablet by mouth 2 (two) times daily.  . citalopram (CELEXA) 10 MG tablet Take 20 mg by mouth daily.   Marland Kitchen Dextromethorphan-Guaifenesin 5-100 MG/5ML LIQD Give 15 ml by mouth every 6 hours prn  . fentaNYL (DURAGESIC - DOSED MCG/HR) 12 MCG/HR Apply one patch topically as directed every 72 hours (every 3 days) note site. Remove  old patch before applying new patch  . HYDROcodone-acetaminophen (HYCET) 7.5-325 mg/15 ml solution Give 57ml by mouth every 6 hours as needed for pain. Max APAP 3gm/24 hours from all sources  . levothyroxine (SYNTHROID, LEVOTHROID) 25 MCG tablet Take 37.5 mcg by mouth daily before breakfast.   . loratadine (CLARITIN) 10 MG tablet Take 10 mg by mouth daily.  . memantine (NAMENDA) 5 MG tablet Take 5 mg by mouth  daily.  . NON FORMULARY Magic cup give once a  day  . Nutritional Supplements (ENSURE ENLIVE PO) One by mouth daily  . senna-docusate (SENOKOT-S) 8.6-50 MG per tablet Take 1 tablet by mouth at bedtime.  Marland Kitchen ZINC OXIDE, TOPICAL, 10 % CREA Apply to buttocks q shift prn for prevention of rash/skin breakdown every shift  . [DISCONTINUED] ferrous sulfate (KP FERROUS SULFATE) 325 (65 FE) MG tablet Take 325 mg by mouth 2 (two) times daily with a meal.   No facility-administered encounter medications on file as of 07/06/2017.       Review of Systems   Essentially unobtainable secondary to dementia please see HPI nursing staff does not report any acute issues apparently she eats fairly well with supportive care is afebrile pulse of 76 respirations 20 blood pressure 104 weight is stable  Immunization History  Administered Date(s) Administered  . Influenza-Unspecified 05/10/2014, 05/02/2016, 05/08/2017  . Pneumococcal Polysaccharide-23 01/02/2014  . Tdap 01/01/2014, 04/16/2017   Pertinent  Health Maintenance Due  Topic Date Due  . INFLUENZA VACCINE  Completed  . DEXA SCAN  Completed  . PNA vac Low Risk Adult  Completed   Fall Risk  04/23/2017  Falls in the past year? No   Functional Status Survey:    She is afebrile pulse is 76 respirations 20 blood pressure 100/64 weight is stable at 130pounds in general this is a  Physical Exam   Frail elderly female in no distress resting comfortably in her [place] chair.  Her skin is warm and dry she is not diaphoretic.  Eyes visual acuity appears grossly intact she has prescription lenses sclera and conjunctive are clear.  Oropharynx appeared to be clear she did not open her mouth very wide for full assessment.  Chest is clear to auscultation with poor respiratory effort there is no labored breathing.  Heart is regular rate and rhythm without murmur gallop or rub she does not really have significant lower extremity edema.  Musculoskeletal upper  extremities strength appears to be preserved-she has significant lower extremity weakness which is not new-assessment was somewhat complicated by patient not following verbal commands.  Neurologic appears to be at baseline could not really appreciate lateralizing findings cranial nerves appear to be intact she is not really speaking or following commands.  Psych as noted above findings consistent with advanced dementia  Labs reviewed: Recent Labs    09/05/16 0700 01/20/17 0727 05/13/17 0700  NA 138 138 140  K 4.0 4.0 4.1  CL 105 105 107  CO2 27 26 23   GLUCOSE 93 96 98  BUN 20 20 19   CREATININE 0.68 0.63 0.66  CALCIUM 8.8* 9.2 9.0   Recent Labs    05/13/17 0700  AST 19  ALT 12*  ALKPHOS 56  BILITOT 0.5  PROT 6.8  ALBUMIN 3.6   Recent Labs    09/05/16 0700 01/20/17 0727 05/13/17 0700  WBC 4.2 4.2 5.1  NEUTROABS  --  2.1  --   HGB 11.5* 12.3 12.9  HCT 35.4* 38.2 40.4  MCV 96.7 95.3 98.3  PLT 187 174 173   Lab Results  Component Value Date   TSH 4.047 06/26/2017   No results found for: HGBA1C No results found for: CHOL, HDL, LDLCALC, LDLDIRECT, TRIG, CHOLHDL  Significant Diagnostic Results in last 30 days:  No results found.  Assessment/Plan   #1-history of right hip fracture followed by left hip fracture as noted above family is opted for no surgical intervention emphasis on comfort pain management she continues on Duragesic patch as well as Vicodin this appears to be effective she appears to be comfortable today.  2.  History of dementia this appears stable she is on Namenda-nursing has not noted any recent behaviors-she appears to be doing well with supportive care weight appears to be relatively stable.  3.  Depression she continues on Celexa.  This appears stable and at baseline.  4.  History of hypothyroidism recent TSH was within normal limits as noted above she is on Synthroid.  Weight has been relatively stable.  5.  History of osteoporosis she  is on calcium with vitamin D.  6.  History of hypertension she is not on any antihypertensives at this point will monitor it does not appear she really needs anything for blood pressure.  7.  Constipation she is on Senokot but this has not apparently been an issue recently.  [id number] note greater than 25 minutes spent assessing patient- discussing her status with nursing staff-reviewing her chart and labs- and coordinating and formulating a plan of care for numerous diagnoses- of note greater than 50% of time spent coordinating plan of care

## 2017-07-29 ENCOUNTER — Other Ambulatory Visit: Payer: Self-pay

## 2017-07-29 MED ORDER — FENTANYL 12 MCG/HR TD PT72: MEDICATED_PATCH | TRANSDERMAL | 0 refills | Status: DC

## 2017-07-29 NOTE — Telephone Encounter (Signed)
RX Fax for [facility]@ [phone]  

## 2017-08-06 DIAGNOSIS — I739 Peripheral vascular disease, unspecified: Secondary | ICD-10-CM | POA: Diagnosis not present

## 2017-08-06 DIAGNOSIS — R262 Difficulty in walking, not elsewhere classified: Secondary | ICD-10-CM | POA: Diagnosis not present

## 2017-08-06 DIAGNOSIS — B351 Tinea unguium: Secondary | ICD-10-CM | POA: Diagnosis not present

## 2017-08-19 ENCOUNTER — Non-Acute Institutional Stay (SKILLED_NURSING_FACILITY): Payer: Medicare Other | Admitting: Internal Medicine

## 2017-08-19 ENCOUNTER — Encounter: Payer: Self-pay | Admitting: Internal Medicine

## 2017-08-19 DIAGNOSIS — F0391 Unspecified dementia with behavioral disturbance: Secondary | ICD-10-CM | POA: Diagnosis not present

## 2017-08-19 DIAGNOSIS — E039 Hypothyroidism, unspecified: Secondary | ICD-10-CM

## 2017-08-19 DIAGNOSIS — R059 Cough, unspecified: Secondary | ICD-10-CM

## 2017-08-19 DIAGNOSIS — R05 Cough: Secondary | ICD-10-CM

## 2017-08-19 DIAGNOSIS — S72002A Fracture of unspecified part of neck of left femur, initial encounter for closed fracture: Secondary | ICD-10-CM | POA: Diagnosis not present

## 2017-08-19 NOTE — Progress Notes (Signed)
Location:   [facility] Room Number: 141/W Place of Service:  SNF [phone]) Provider:  Freddi Starr, MD  Patient Care Team: Virgie Dad, MD as PCP - General (Internal Medicine)  Extended Emergency Contact Information Primary Emergency Contact: Noreene Larsson of [place] Phone: [phone] Mobile Phone: [phone] Relation: Niece Secondary Emergency Contact: Harvie Bridge States of [place] Phone: [phone] Mobile Phone: [phone] Relation: Sister  Code Status:  DNR Goals of care: Advanced Directive information Advanced Directives 08/19/2017  Does Patient Have a Medical Advance Directive? Yes  Type of Advance Directive Out of facility DNR (pink MOST or yellow form)  Does patient want to make changes to medical advance directive? No - Patient declined  Copy of [place] in Chart? No - copy requested  Would patient like information on creating a medical advance directive? No - Patient declined  Pre-existing out of facility DNR order (yellow form or pink MOST form) -     Chief complaint-acute visit secondary to cough  HPI:  Pt is a 84 y.o. female seen today for an acute visit for follow-up of cough.  According to nursing and her sitter she has an occasional cough that is nonproductive.  Patient cannot really give any review of systems since she has advanced dementia other diagnoses include osteoporosis history of bilateral hip fractures that are medically managed for comfort no surgical repair on most recent fracture-pain appears to be controlled on Duragesic patch as well as as needed Vicodin.  Regards to the cough her sitter states this is intermittent nonproductive-patient does not appear to have any respiratory discomfort--sitter does state couple days ago.  while swallowing a piece of food she appear to have possible some transitory difficulty breathing but this was quite  transitory  vital signs are stable she is afebrile O2 saturation is In the 90s on room air       Past Medical History:  Diagnosis Date  . Dementia   . Osteoarthritis    Past Surgical History:  Procedure Laterality Date  . BREAST SURGERY    . EYE SURGERY    . HIP ARTHROPLASTY Right 01/01/2014   Procedure: BIPOLAR ARTHROPLASTY RIGHT HIP;  Surgeon: Carole Civil, MD;  Location: [facility];  Service: Orthopedics;  Laterality: Right;    Allergies  Allergen Reactions  . Morphine And Related     Increased confusion    Outpatient Encounter Medications as of 08/19/2017  Medication Sig  . acetaminophen (TYLENOL) 325 MG suppository Place 325 mg rectally every 4 (four) hours as needed.  Marland Kitchen acetaminophen (TYLENOL) 500 MG tablet Take one to two tablets by mouth every 6 hours as needed for mild to moderate pain. DNE 3000mg  Tylenol in 24 hours from all sources.  . calcium-vitamin D (OSCAL WITH D) 500-200 MG-UNIT tablet Take 1 tablet by mouth 2 (two) times daily.  . citalopram (CELEXA) 10 MG tablet Take 20 mg by mouth daily.   Marland Kitchen Dextromethorphan-Guaifenesin 5-100 MG/5ML LIQD Give 15 ml by mouth every 6 hours prn  . fentaNYL (DURAGESIC - DOSED MCG/HR) 12 MCG/HR Apply one patch topically as directed every 72 hours (every 3 days) note site. Remove old patch before applying new patch  . HYDROcodone-acetaminophen (HYCET) 7.5-325 mg/15 ml solution Give 27ml by mouth every 6 hours as needed for pain. Max APAP 3gm/24 hours from all sources  . levothyroxine (SYNTHROID, LEVOTHROID) 25 MCG tablet Take 37.5 mcg by mouth daily before breakfast.   .  loratadine (CLARITIN) 10 MG tablet Take 10 mg by mouth daily.  . memantine (NAMENDA) 5 MG tablet Take 5 mg by mouth daily.  . NON FORMULARY Magic cup give once a  day  . Nutritional Supplements (ENSURE ENLIVE PO) One by mouth daily  . senna-docusate (SENOKOT-S) 8.6-50 MG per tablet Take 1 tablet by mouth at bedtime.  Marland Kitchen ZINC OXIDE, TOPICAL, 10 % CREA Apply to  buttocks q shift prn for prevention of rash/skin breakdown every shift   No facility-administered encounter medications on file as of 08/19/2017.     Review of Systems   Unobtainable secondary to dementia per nursing and her sitter no signs of distress occasional nonproductive cough patient has been at baseline  Immunization History  Administered Date(s) Administered  . Influenza-Unspecified 05/10/2014, 05/02/2016, 05/08/2017  . Pneumococcal Polysaccharide-23 01/02/2014  . Tdap 01/01/2014, 04/16/2017   Pertinent  Health Maintenance Due  Topic Date Due  . INFLUENZA VACCINE  Completed  . DEXA SCAN  Completed  . PNA vac Low Risk Adult  Completed   Fall Risk  04/23/2017  Falls in the past year? No   Functional Status Survey:     temperature is 98.1 pulse 68 respirations 18 blood pressure is 126/60 O2 saturation 98% on room air  Physical Exam  In general this is a somewhat frail elderly female in no distress resting comfortably in bed she is largely nonverbal does make eye contact.  Her skin is warm and dry.  Eyes has some slight watery drainage with a history of allergic rhinitis is on chronic Claritin- sclera and conjunctive are clear I do not see any exudate.  Oropharynx is clear mucous membranes appear fairly moist.  Chest is clear to auscultation with poor respiratory effort could not really appreciate overt congestion she does not follow verbal commands and does not do deep inspiration.  Heart is regular rate and rhythm without murmur gallop or rub she does not really appear to have significant lower extremity edema.  Musculoskeletal Limited exam since she is in bed but upper extremity strength appears to be intact she has lower extremity weakness she does have boots on her feet bilaterally for protection.  Neurologic again appears to be grossly intact could not really sees lateralizing findings cranial nerves appear to be intact she is not really verbal today for following  verbal commands.  Psych she does make eye contact--is not really speaking today however does not follow simple verbal commands--this appears to be baseline   Labs reviewed: Recent Labs    09/05/16 0700 01/20/17 0727 05/13/17 0700  NA 138 138 140  K 4.0 4.0 4.1  CL 105 105 107  CO2 27 26 23   GLUCOSE 93 96 98  BUN 20 20 19   CREATININE 0.68 0.63 0.66  CALCIUM 8.8* 9.2 9.0   Recent Labs    05/13/17 0700  AST 19  ALT 12*  ALKPHOS 56  BILITOT 0.5  PROT 6.8  ALBUMIN 3.6   Recent Labs    09/05/16 0700 01/20/17 0727 05/13/17 0700  WBC 4.2 4.2 5.1  NEUTROABS  --  2.1  --   HGB 11.5* 12.3 12.9  HCT 35.4* 38.2 40.4  MCV 96.7 95.3 98.3  PLT 187 174 173   Lab Results  Component Value Date   TSH 4.047 06/26/2017   No results found for: HGBA1C No results found for: CHOL, HDL, LDLCALC, LDLDIRECT, TRIG, CHOLHDL  Significant Diagnostic Results in last 30 days:  No results found.  Assessment/Plan  #  1 cough-will add Mucinex 600 mg twice daily for 5 days-also with a history of the sitter gave me will obtain an x-ray just to rule out any possibility of aspiration.  Also monitor vital signs pulse ox every shift for 48 hours.  2.  Allergic rhinitis at this point continue Claritin- monitor-per nursing she has been on this long-term and tolerated it well.  1 #3-history of advanced dementia-per sitter continues to have somewhat of a poor appetite weight appears to be stable however will update a metabolic panel and CBC for updated values--she is on Namenda  #4 hypothyroidism-she is on Synthroid TSH was high normal on lab done in November will update this  #5- history of bilateral hip fractures again conservative care per family wishes and advanced dementia she is on Duragesic patch routinely and Vicodin as needed this appears to be stable and at baseline   [id number]

## 2017-08-19 NOTE — Progress Notes (Deleted)
Location:   [facility] Room Number: 141/W Place of Service:  SNF [phone]) Provider:  Freddi Starr, MD  Patient Care Team: Virgie Dad, MD as PCP - General (Internal Medicine)  Extended Emergency Contact Information Primary Emergency Contact: Noreene Larsson of [place] Phone: [phone] Mobile Phone: [phone] Relation: Niece Secondary Emergency Contact: Harvie Bridge States of [place] Phone: [phone] Mobile Phone: [phone] Relation: Sister  Code Status:  DNR Goals of care: Advanced Directive information Advanced Directives 08/19/2017  Does Patient Have a Medical Advance Directive? Yes  Type of Advance Directive Out of facility DNR (pink MOST or yellow form)  Does patient want to make changes to medical advance directive? No - Patient declined  Copy of [place] in Chart? No - copy requested  Would patient like information on creating a medical advance directive? No - Patient declined  Pre-existing out of facility DNR order (yellow form or pink MOST form) -     Chief Complaint  Patient presents with  . Medical Management of Chronic Issues    Routine Visit  . Acute Visit    Patients c/o Cough    HPI:  Pt is a 84 y.o. female seen today for medical management of chronic diseases.     Past Medical History:  Diagnosis Date  . Dementia   . Osteoarthritis    Past Surgical History:  Procedure Laterality Date  . BREAST SURGERY    . EYE SURGERY    . HIP ARTHROPLASTY Right 01/01/2014   Procedure: BIPOLAR ARTHROPLASTY RIGHT HIP;  Surgeon: Carole Civil, MD;  Location: [facility];  Service: Orthopedics;  Laterality: Right;    Allergies  Allergen Reactions  . Morphine And Related     Increased confusion    Outpatient Encounter Medications as of 08/19/2017  Medication Sig  . acetaminophen (TYLENOL) 325 MG suppository Place 325 mg rectally every 4 (four) hours as  needed.  Marland Kitchen acetaminophen (TYLENOL) 500 MG tablet Take one to two tablets by mouth every 6 hours as needed for mild to moderate pain. DNE 3000mg  Tylenol in 24 hours from all sources.  . calcium-vitamin D (OSCAL WITH D) 500-200 MG-UNIT tablet Take 1 tablet by mouth 2 (two) times daily.  . citalopram (CELEXA) 10 MG tablet Take 20 mg by mouth daily.   Marland Kitchen Dextromethorphan-Guaifenesin 5-100 MG/5ML LIQD Give 15 ml by mouth every 6 hours prn  . fentaNYL (DURAGESIC - DOSED MCG/HR) 12 MCG/HR Apply one patch topically as directed every 72 hours (every 3 days) note site. Remove old patch before applying new patch  . HYDROcodone-acetaminophen (HYCET) 7.5-325 mg/15 ml solution Give 80ml by mouth every 6 hours as needed for pain. Max APAP 3gm/24 hours from all sources  . levothyroxine (SYNTHROID, LEVOTHROID) 25 MCG tablet Take 37.5 mcg by mouth daily before breakfast.   . loratadine (CLARITIN) 10 MG tablet Take 10 mg by mouth daily.  . memantine (NAMENDA) 5 MG tablet Take 5 mg by mouth daily.  . NON FORMULARY Magic cup give once a  day  . Nutritional Supplements (ENSURE ENLIVE PO) One by mouth daily  . senna-docusate (SENOKOT-S) 8.6-50 MG per tablet Take 1 tablet by mouth at bedtime.  Marland Kitchen ZINC OXIDE, TOPICAL, 10 % CREA Apply to buttocks q shift prn for prevention of rash/skin breakdown every shift   No facility-administered encounter medications on file as of 08/19/2017.      Review of Systems  Immunization History  Administered Date(s) Administered  . Influenza-Unspecified 05/10/2014, 05/02/2016, 05/08/2017  . Pneumococcal Polysaccharide-23 01/02/2014  . Tdap 01/01/2014, 04/16/2017   Pertinent  Health Maintenance Due  Topic Date Due  . INFLUENZA VACCINE  Completed  . DEXA SCAN  Completed  . PNA vac Low Risk Adult  Completed   Fall Risk  04/23/2017  Falls in the past year? No   Functional Status Survey:    There were no vitals filed for this visit. There is no height or weight on file to  calculate BMI. Physical Exam  Labs reviewed: Recent Labs    09/05/16 0700 01/20/17 0727 05/13/17 0700  NA 138 138 140  K 4.0 4.0 4.1  CL 105 105 107  CO2 27 26 23   GLUCOSE 93 96 98  BUN 20 20 19   CREATININE 0.68 0.63 0.66  CALCIUM 8.8* 9.2 9.0   Recent Labs    05/13/17 0700  AST 19  ALT 12*  ALKPHOS 56  BILITOT 0.5  PROT 6.8  ALBUMIN 3.6   Recent Labs    09/05/16 0700 01/20/17 0727 05/13/17 0700  WBC 4.2 4.2 5.1  NEUTROABS  --  2.1  --   HGB 11.5* 12.3 12.9  HCT 35.4* 38.2 40.4  MCV 96.7 95.3 98.3  PLT 187 174 173   Lab Results  Component Value Date   TSH 4.047 06/26/2017   No results found for: HGBA1C No results found for: CHOL, HDL, LDLCALC, LDLDIRECT, TRIG, CHOLHDL  Significant Diagnostic Results in last 30 days:  No results found.  Assessment/Plan There are no diagnoses linked to this encounter.   Family/ staff Communication:   Labs/tests ordered:

## 2017-08-19 NOTE — Progress Notes (Deleted)
Location:   [facility] Room Number: 141/W Place of Service:  SNF [phone]) Provider:  Freddi Starr, MD  Patient Care Team: Virgie Dad, MD as PCP - General (Internal Medicine)  Extended Emergency Contact Information Primary Emergency Contact: Noreene Larsson of [place] Phone: [phone] Mobile Phone: [phone] Relation: Niece Secondary Emergency Contact: Harvie Bridge States of [place] Phone: [phone] Mobile Phone: [phone] Relation: Sister  Code Status:  DNR Goals of care: Advanced Directive information Advanced Directives 08/19/2017  Does Patient Have a Medical Advance Directive? Yes  Type of Advance Directive Out of facility DNR (pink MOST or yellow form)  Does patient want to make changes to medical advance directive? No - Patient declined  Copy of [place] in Chart? No - copy requested  Would patient like information on creating a medical advance directive? No - Patient declined  Pre-existing out of facility DNR order (yellow form or pink MOST form) -     Chief Complaint  Patient presents with  . Acute Visit    Patients c/o Cough    HPI:  Pt is a 84 y.o. female seen today for an acute visit for    Past Medical History:  Diagnosis Date  . Dementia   . Osteoarthritis    Past Surgical History:  Procedure Laterality Date  . BREAST SURGERY    . EYE SURGERY    . HIP ARTHROPLASTY Right 01/01/2014   Procedure: BIPOLAR ARTHROPLASTY RIGHT HIP;  Surgeon: Carole Civil, MD;  Location: [facility];  Service: Orthopedics;  Laterality: Right;    Allergies  Allergen Reactions  . Morphine And Related     Increased confusion    Outpatient Encounter Medications as of 08/19/2017  Medication Sig  . acetaminophen (TYLENOL) 325 MG suppository Place 325 mg rectally every 4 (four) hours as needed.  Marland Kitchen acetaminophen (TYLENOL) 500 MG tablet Take one to two tablets by mouth every  6 hours as needed for mild to moderate pain. DNE 3000mg  Tylenol in 24 hours from all sources.  . calcium-vitamin D (OSCAL WITH D) 500-200 MG-UNIT tablet Take 1 tablet by mouth 2 (two) times daily.  . citalopram (CELEXA) 10 MG tablet Take 20 mg by mouth daily.   Marland Kitchen Dextromethorphan-Guaifenesin 5-100 MG/5ML LIQD Give 15 ml by mouth every 6 hours prn  . fentaNYL (DURAGESIC - DOSED MCG/HR) 12 MCG/HR Apply one patch topically as directed every 72 hours (every 3 days) note site. Remove old patch before applying new patch  . HYDROcodone-acetaminophen (HYCET) 7.5-325 mg/15 ml solution Give 59ml by mouth every 6 hours as needed for pain. Max APAP 3gm/24 hours from all sources  . levothyroxine (SYNTHROID, LEVOTHROID) 25 MCG tablet Take 37.5 mcg by mouth daily before breakfast.   . loratadine (CLARITIN) 10 MG tablet Take 10 mg by mouth daily.  . memantine (NAMENDA) 5 MG tablet Take 5 mg by mouth daily.  . NON FORMULARY Magic cup give once a  day  . Nutritional Supplements (ENSURE ENLIVE PO) One by mouth daily  . senna-docusate (SENOKOT-S) 8.6-50 MG per tablet Take 1 tablet by mouth at bedtime.  Marland Kitchen ZINC OXIDE, TOPICAL, 10 % CREA Apply to buttocks q shift prn for prevention of rash/skin breakdown every shift   No facility-administered encounter medications on file as of 08/19/2017.     Review of Systems  Immunization History  Administered Date(s) Administered  . Influenza-Unspecified 05/10/2014, 05/02/2016, 05/08/2017  . Pneumococcal Polysaccharide-23 01/02/2014  .  Tdap 01/01/2014, 04/16/2017   Pertinent  Health Maintenance Due  Topic Date Due  . INFLUENZA VACCINE  Completed  . DEXA SCAN  Completed  . PNA vac Low Risk Adult  Completed   Fall Risk  04/23/2017  Falls in the past year? No   Functional Status Survey:    There were no vitals filed for this visit. There is no height or weight on file to calculate BMI. Physical Exam  Labs reviewed: Recent Labs    09/05/16 0700 01/20/17 0727  05/13/17 0700  NA 138 138 140  K 4.0 4.0 4.1  CL 105 105 107  CO2 27 26 23   GLUCOSE 93 96 98  BUN 20 20 19   CREATININE 0.68 0.63 0.66  CALCIUM 8.8* 9.2 9.0   Recent Labs    05/13/17 0700  AST 19  ALT 12*  ALKPHOS 56  BILITOT 0.5  PROT 6.8  ALBUMIN 3.6   Recent Labs    09/05/16 0700 01/20/17 0727 05/13/17 0700  WBC 4.2 4.2 5.1  NEUTROABS  --  2.1  --   HGB 11.5* 12.3 12.9  HCT 35.4* 38.2 40.4  MCV 96.7 95.3 98.3  PLT 187 174 173   Lab Results  Component Value Date   TSH 4.047 06/26/2017   No results found for: HGBA1C No results found for: CHOL, HDL, LDLCALC, LDLDIRECT, TRIG, CHOLHDL  Significant Diagnostic Results in last 30 days:  No results found.  Assessment/Plan There are no diagnoses linked to this encounter.      Oralia Manis, [place]

## 2017-08-25 ENCOUNTER — Non-Acute Institutional Stay (SKILLED_NURSING_FACILITY): Payer: Medicare Other | Admitting: Internal Medicine

## 2017-08-25 ENCOUNTER — Encounter: Payer: Self-pay | Admitting: Internal Medicine

## 2017-08-25 DIAGNOSIS — S72002A Fracture of unspecified part of neck of left femur, initial encounter for closed fracture: Secondary | ICD-10-CM

## 2017-08-25 DIAGNOSIS — M810 Age-related osteoporosis without current pathological fracture: Secondary | ICD-10-CM

## 2017-08-25 DIAGNOSIS — F0391 Unspecified dementia with behavioral disturbance: Secondary | ICD-10-CM

## 2017-08-25 DIAGNOSIS — E039 Hypothyroidism, unspecified: Secondary | ICD-10-CM

## 2017-08-25 DIAGNOSIS — F329 Major depressive disorder, single episode, unspecified: Secondary | ICD-10-CM

## 2017-08-25 DIAGNOSIS — F32A Depression, unspecified: Secondary | ICD-10-CM

## 2017-08-25 NOTE — Progress Notes (Signed)
Location:   [facility] Room Number: 141/W Place of Service:  SNF [phone]) Provider:  Freddi Starr, MD  Patient Care Team: Virgie Dad, MD as PCP - General (Internal Medicine)  Extended Emergency Contact Information Primary Emergency Contact: Noreene Larsson of [place] Phone: [phone] Mobile Phone: [phone] Relation: Niece Secondary Emergency Contact: Harvie Bridge States of [place] Phone: [phone] Mobile Phone: [phone] Relation: Sister  Code Status:  DNR Goals of care: Advanced Directive information Advanced Directives 08/25/2017  Does Patient Have a Medical Advance Directive? Yes  Type of Advance Directive Out of facility DNR (pink MOST or yellow form)  Does patient want to make changes to medical advance directive? No - Patient declined  Copy of [place] in Chart? No - copy requested  Would patient like information on creating a medical advance directive? No - Patient declined  Pre-existing out of facility DNR order (yellow form or pink MOST form) -     Chief Complaint  Patient presents with  . Medical Management of Chronic Issues    Routine Visit   For medical management of chronic medical conditions including bilateral hip fractures-dementia-osteoporosis-hypothyroidism- also recent history of cough With HPI:  Pt is a 84 y.o. female seen today for medical management of chronic diseases as noted above. She appears to be stable and at baseline with a history of significant dementia.  She was seen recently for a cough chest x-ray was ordered which did not show any acute process she has completed a course of Mucinex and apparently this has improved.  She does have a significant history again of advanced dementia but her weights been stable with somewhat of a spotty appetite she does like her supplements apparently.  She does have a history of a right hip fracture  followed by left hip fracture secondary to a fall in July 2016-family has opted not to have surgical repair with emphasis on management-she is on a Duragesic patch she does have Vicodin as needed and this appears to be helping she is largely bedbound.  She does have a history of hypothyroidism she is on Synthroid TSH was 4.047 back in November we will update this.  She also has a history of osteoporosis and is on calcium with vitamin D  Vital signs appear to be stable nursing does not report any significant issues.       Past Medical History:  Diagnosis Date  . Dementia   . Osteoarthritis    Past Surgical History:  Procedure Laterality Date  . BREAST SURGERY    . EYE SURGERY    . HIP ARTHROPLASTY Right 01/01/2014   Procedure: BIPOLAR ARTHROPLASTY RIGHT HIP;  Surgeon: Carole Civil, MD;  Location: [facility];  Service: Orthopedics;  Laterality: Right;    Allergies  Allergen Reactions  . Morphine And Related     Increased confusion    Outpatient Encounter Medications as of 08/25/2017  Medication Sig  . acetaminophen (TYLENOL) 325 MG suppository Place 325 mg rectally every 4 (four) hours as needed.  Marland Kitchen acetaminophen (TYLENOL) 500 MG tablet Take one to two tablets by mouth every 6 hours as needed for mild to moderate pain. DNE 3000mg  Tylenol in 24 hours from all sources.  Marland Kitchen acetaminophen (TYLENOL) 500 MG tablet Take 500 mg by mouth every 6 (six) hours as needed.  . calcium-vitamin D (OSCAL WITH D) 500-200 MG-UNIT tablet Take 1 tablet by mouth 2 (two) times daily.  Marland Kitchen  citalopram (CELEXA) 10 MG tablet Take 20 mg by mouth daily.   Marland Kitchen Dextromethorphan-Guaifenesin 5-100 MG/5ML LIQD Give 15 ml by mouth every 6 hours prn  . fentaNYL (DURAGESIC - DOSED MCG/HR) 12 MCG/HR Apply one patch topically as directed every 72 hours (every 3 days) note site. Remove old patch before applying new patch  . guaiFENesin (MUCINEX) 600 MG 12 hr tablet Take by mouth 2 (two) times daily.  Marland Kitchen  HYDROcodone-acetaminophen (HYCET) 7.5-325 mg/15 ml solution Give 31ml by mouth every 6 hours as needed for pain. Max APAP 3gm/24 hours from all sources  . levothyroxine (SYNTHROID, LEVOTHROID) 25 MCG tablet Take 37.5 mcg by mouth daily before breakfast.   . loratadine (CLARITIN) 10 MG tablet Take 10 mg by mouth daily.  . memantine (NAMENDA) 5 MG tablet Take 5 mg by mouth daily.  . NON FORMULARY Magic cup give once a  day  . Nutritional Supplements (ENSURE ENLIVE PO) One by mouth daily  . senna-docusate (SENOKOT-S) 8.6-50 MG per tablet Take 1 tablet by mouth at bedtime.  Marland Kitchen zinc oxide 20 % ointment Apply to buttocks q shift prn for prevention of rash/skin breakdown every shift  . [DISCONTINUED] ZINC OXIDE, TOPICAL, 10 % CREA Apply to buttocks q shift prn for prevention of rash/skin breakdown every shift   No facility-administered encounter medications on file as of 08/25/2017.      Review of Systems  Is unobtainable secondary to dementia  Immunization History  Administered Date(s) Administered  . Influenza-Unspecified 05/10/2014, 05/02/2016, 05/08/2017  . Pneumococcal Polysaccharide-23 01/02/2014  . Tdap 01/01/2014, 04/16/2017   Pertinent  Health Maintenance Due  Topic Date Due  . INFLUENZA VACCINE  Completed  . DEXA SCAN  Completed  . PNA vac Low Risk Adult  Completed   Fall Risk  04/23/2017  Falls in the past year? No   Functional Status Survey:    Vitals:   08/25/17 1244  BP: 110/89  Pulse: 80  Resp: 18  Temp: 98.6 F (37 C)  TempSrc: Oral  SpO2: 95%  Weight: 131 lb 8 oz (59.6 kg)  Height: 5\' 3"  (1.6 m)   Body mass index is 23.29 kg/m. Physical Exam   In general this is a somewhat frail elderly female no distress resting comfortably in bed she is sleeping but easily arousable.  Her skin is warm and dry.  Eyes visual acuity appears grossly intact she does make eye contact she has prescription lenses sclera and conjunctive are remain clear.  Oropharynx is clear  mucous membranes moist.  Chest is clear to auscultation with poor respiratory effort does not really follow verbal commands there is no labored breathing.  Heart is regular rate and rhythm without murmur gallop or rub she does not appear to have significant lower extremity edema.  Abdomen is soft nontender with positive bowel sounds somewhat protuberant.  Musculoskeletal upper extremity strength appears relatively preserved she has lower extremity weakness assessments limited because patient does not follow verbal commands and she is in bed she does have protective boots on her feet bilaterally.  Neurologic appears to be at baseline cranial nerves appear to be intact she is not really speaking this afternoon but she does not follow commands.  Psych again findings consistent with advanced dementia as noted above   Labs reviewed: Recent Labs    09/05/16 0700 01/20/17 0727 05/13/17 0700  NA 138 138 140  K 4.0 4.0 4.1  CL 105 105 107  CO2 27 26 23   GLUCOSE 93 96 98  BUN 20 20 19   CREATININE 0.68 0.63 0.66  CALCIUM 8.8* 9.2 9.0   Recent Labs    05/13/17 0700  AST 19  ALT 12*  ALKPHOS 56  BILITOT 0.5  PROT 6.8  ALBUMIN 3.6   Recent Labs    09/05/16 0700 01/20/17 0727 05/13/17 0700  WBC 4.2 4.2 5.1  NEUTROABS  --  2.1  --   HGB 11.5* 12.3 12.9  HCT 35.4* 38.2 40.4  MCV 96.7 95.3 98.3  PLT 187 174 173   Lab Results  Component Value Date   TSH 4.047 06/26/2017   No results found for: HGBA1C No results found for: CHOL, HDL, LDLCALC, LDLDIRECT, TRIG, CHOLHDL  Significant Diagnostic Results in last 30 days:  No results found.  Assessment/Plan  #1-history of dementia she appears to be doing well with supportive care her weight is stable she does take her supplements apparently well she does have a sitter most of the time- she continues on Namenda at this point will monitor .  Also will update labs including a metabolic panel and CBC for updated values  #2 history  of cough this appears to have improved per nursing chest x-ray was negative she does have Robitussin as needed she has completed a course of routine Mucinex  #3 history of bilateral hip fractures again this is managed conservatively she does have a Duragesic patch and appears to tolerate this well she also has hydrocodone as needed  #4 history of allergic rhinitis this appears stable on Claritin.  5.  History of depression with coexistent dementia again she is on Namenda for the dementia as well as Celexa for depression at this point will monitor.  6.  History of hypothyroidism TSH was 4.047 on lab done in November will update this as well.  7.-  History of osteoporosis continues on calcium with vitamin D.  8.  History of constipation she is on Senokot nursing does not report any issues in this regard  CPT- [id number]

## 2017-08-26 ENCOUNTER — Encounter (HOSPITAL_COMMUNITY)
Admission: RE | Admit: 2017-08-26 | Discharge: 2017-08-26 | Disposition: A | Discharge status: Home / Self Care | Payer: Medicare Other | Source: Skilled Nursing Facility | Attending: Internal Medicine | Admitting: Internal Medicine

## 2017-08-26 ENCOUNTER — Other Ambulatory Visit: Payer: Self-pay

## 2017-08-26 DIAGNOSIS — F0390 Unspecified dementia without behavioral disturbance: Secondary | ICD-10-CM | POA: Diagnosis not present

## 2017-08-26 DIAGNOSIS — Z8781 Personal history of (healed) traumatic fracture: Secondary | ICD-10-CM | POA: Diagnosis not present

## 2017-08-26 DIAGNOSIS — M810 Age-related osteoporosis without current pathological fracture: Secondary | ICD-10-CM | POA: Diagnosis not present

## 2017-08-26 DIAGNOSIS — E039 Hypothyroidism, unspecified: Secondary | ICD-10-CM | POA: Diagnosis not present

## 2017-08-26 DIAGNOSIS — F411 Generalized anxiety disorder: Secondary | ICD-10-CM | POA: Insufficient documentation

## 2017-08-26 LAB — CBC WITH DIFFERENTIAL/PLATELET
BASOS ABS: 0 10*3/uL (ref 0.0–0.1)
Basophils Relative: 0 %
EOS PCT: 2 %
Eosinophils Absolute: 0.1 10*3/uL (ref 0.0–0.7)
HCT: 33.6 % — ABNORMAL LOW (ref 36.0–46.0)
HEMOGLOBIN: 10.5 g/dL — AB (ref 12.0–15.0)
Lymphocytes Relative: 27 %
Lymphs Abs: 1.2 10*3/uL (ref 0.7–4.0)
MCH: 30.7 pg (ref 26.0–34.0)
MCHC: 31.3 g/dL (ref 30.0–36.0)
MCV: 98.2 fL (ref 78.0–100.0)
Monocytes Absolute: 0.8 10*3/uL (ref 0.1–1.0)
Monocytes Relative: 18 %
NEUTROS ABS: 2.4 10*3/uL (ref 1.7–7.7)
NEUTROS PCT: 53 %
PLATELETS: 228 10*3/uL (ref 150–400)
RBC: 3.42 MIL/uL — AB (ref 3.87–5.11)
RDW: 12.8 % (ref 11.5–15.5)
WBC: 4.5 10*3/uL (ref 4.0–10.5)

## 2017-08-26 LAB — TSH: TSH: 4.101 u[IU]/mL (ref 0.350–4.500)

## 2017-08-26 MED ORDER — FENTANYL 12 MCG/HR TD PT72: MEDICATED_PATCH | TRANSDERMAL | 0 refills | Status: DC

## 2017-08-26 NOTE — Telephone Encounter (Signed)
RX Fax for [facility]@ [phone]  

## 2017-09-30 ENCOUNTER — Other Ambulatory Visit: Payer: Self-pay

## 2017-09-30 MED ORDER — FENTANYL 12 MCG/HR TD PT72: MEDICATED_PATCH | TRANSDERMAL | 0 refills | Status: DC

## 2017-09-30 NOTE — Telephone Encounter (Signed)
RX Fax for [facility]@ [phone]  

## 2017-10-05 ENCOUNTER — Encounter (HOSPITAL_COMMUNITY)
Admission: RE | Admit: 2017-10-05 | Discharge: 2017-10-05 | Disposition: A | Discharge status: Home / Self Care | Payer: Medicare Other | Source: Skilled Nursing Facility | Attending: Internal Medicine | Admitting: Internal Medicine

## 2017-10-05 DIAGNOSIS — Z8781 Personal history of (healed) traumatic fracture: Secondary | ICD-10-CM | POA: Diagnosis not present

## 2017-10-05 DIAGNOSIS — M810 Age-related osteoporosis without current pathological fracture: Secondary | ICD-10-CM | POA: Diagnosis not present

## 2017-10-05 DIAGNOSIS — F411 Generalized anxiety disorder: Secondary | ICD-10-CM | POA: Diagnosis not present

## 2017-10-05 DIAGNOSIS — F0390 Unspecified dementia without behavioral disturbance: Secondary | ICD-10-CM | POA: Insufficient documentation

## 2017-10-05 DIAGNOSIS — E039 Hypothyroidism, unspecified: Secondary | ICD-10-CM | POA: Diagnosis not present

## 2017-10-05 LAB — BASIC METABOLIC PANEL
Anion gap: 9 (ref 5–15)
BUN: 27 mg/dL — ABNORMAL HIGH (ref 6–20)
CALCIUM: 9.2 mg/dL (ref 8.9–10.3)
CHLORIDE: 105 mmol/L (ref 101–111)
CO2: 27 mmol/L (ref 22–32)
CREATININE: 0.69 mg/dL (ref 0.44–1.00)
GFR calc Af Amer: >60 mL/min (ref >60)
GFR calc non Af Amer: >60 mL/min (ref >60)
GLUCOSE: 89 mg/dL (ref 65–99)
Potassium: 3.9 mmol/L (ref 3.5–5.1)
Sodium: 141 mmol/L (ref 135–145)

## 2017-10-16 ENCOUNTER — Non-Acute Institutional Stay (SKILLED_NURSING_FACILITY): Payer: Medicare Other | Admitting: Internal Medicine

## 2017-10-16 ENCOUNTER — Encounter: Payer: Self-pay | Admitting: Internal Medicine

## 2017-10-16 DIAGNOSIS — H1132 Conjunctival hemorrhage, left eye: Secondary | ICD-10-CM

## 2017-10-16 DIAGNOSIS — F0391 Unspecified dementia with behavioral disturbance: Secondary | ICD-10-CM | POA: Diagnosis not present

## 2017-10-16 DIAGNOSIS — J309 Allergic rhinitis, unspecified: Secondary | ICD-10-CM

## 2017-10-16 DIAGNOSIS — F4323 Adjustment disorder with mixed anxiety and depressed mood: Secondary | ICD-10-CM | POA: Diagnosis not present

## 2017-10-16 NOTE — Progress Notes (Signed)
Location:   [facility] Room Number: 141/W Place of Service:  SNF [phone]) Provider:  Freddi Starr, MD  Patient Care Team: Virgie Dad, MD as PCP - General (Internal Medicine)  Extended Emergency Contact Information Primary Emergency Contact: Noreene Larsson of [place] Phone: [phone] Mobile Phone: [phone] Relation: Niece Secondary Emergency Contact: Harvie Bridge States of [place] Phone: [phone] Mobile Phone: [phone] Relation: Sister  Code Status:  DNR Goals of care: Advanced Directive information Advanced Directives 10/16/2017  Does Patient Have a Medical Advance Directive? Yes  Type of Advance Directive Out of facility DNR (pink MOST or yellow form)  Does patient want to make changes to medical advance directive? No - Patient declined  Copy of [place] in Chart? No - copy requested  Would patient like information on creating a medical advance directive? No - Patient declined  Pre-existing out of facility DNR order (yellow form or pink MOST form) -      chief complaint-acute visit secondary to erythematous left eye  HPI:  Pt is a 84 y.o. female seen today for an acute visit for an erythematous left eye.  Patient is a long-term resident of facility with a history of severe dementia as well as bilateral hip fractures osteoporosis hypothyroidism.  And allergic rhinitis  Her sitter today noticed that she did have an erythematous conjunctive on the left eye and I am following up on this-patient has severe dementia and cannot really give any review of systems but does not appear to be painful her sitter says she does not really have any increased drainage beyond baseline which she occasionally has in the morning-but this has not acutely changed.  Vital signs appear to be stable she is afebrile  I do note she does have a history of chronic allergic rhinitis and continues on  Claritin   Past Medical History:  Diagnosis Date  . Dementia   . Osteoarthritis    Past Surgical History:  Procedure Laterality Date  . BREAST SURGERY    . EYE SURGERY    . HIP ARTHROPLASTY Right 01/01/2014   Procedure: BIPOLAR ARTHROPLASTY RIGHT HIP;  Surgeon: Carole Civil, MD;  Location: [facility];  Service: Orthopedics;  Laterality: Right;    Allergies  Allergen Reactions  . Morphine And Related     Increased confusion    Outpatient Encounter Medications as of 10/16/2017  Medication Sig  . acetaminophen (TYLENOL) 325 MG suppository Place 325 mg rectally every 4 (four) hours as needed.  Marland Kitchen acetaminophen (TYLENOL) 500 MG tablet Take one to two tablets by mouth every 6 hours as needed for mild to moderate pain. DNE 3000mg  Tylenol in 24 hours from all sources.  Marland Kitchen acetaminophen (TYLENOL) 500 MG tablet Take 500 mg by mouth every 6 (six) hours as needed.  . calcium-vitamin D (OSCAL WITH D) 500-200 MG-UNIT tablet Take 1 tablet by mouth 2 (two) times daily.  . citalopram (CELEXA) 10 MG tablet Take 20 mg by mouth daily.   Marland Kitchen Dextromethorphan-Guaifenesin 5-100 MG/5ML LIQD Give 15 ml by mouth every 6 hours prn  . fentaNYL (DURAGESIC - DOSED MCG/HR) 12 MCG/HR Apply one patch topically as directed every 72 hours (every 3 days) note site. Remove old patch before applying new patch  . HYDROcodone-acetaminophen (HYCET) 7.5-325 mg/15 ml solution Give 12ml by mouth every 6 hours as needed for pain. Max APAP 3gm/24 hours from all sources  . levothyroxine (SYNTHROID, LEVOTHROID) 25  MCG tablet Take 37.5 mcg by mouth daily before breakfast.   . loratadine (CLARITIN) 10 MG tablet Take 10 mg by mouth daily.  . memantine (NAMENDA) 5 MG tablet Take 5 mg by mouth daily.  . NON FORMULARY Magic cup give once a  day  . Nutritional Supplements (ENSURE ENLIVE PO) One by mouth daily  . senna-docusate (SENOKOT-S) 8.6-50 MG per tablet Take 1 tablet by mouth at bedtime.  . [DISCONTINUED] guaiFENesin (MUCINEX) 600  MG 12 hr tablet Take by mouth 2 (two) times daily.  . [DISCONTINUED] zinc oxide 20 % ointment Apply to buttocks q shift prn for prevention of rash/skin breakdown every shift   No facility-administered encounter medications on file as of 10/16/2017.     Review of Systems   Unobtainable secondary to dementia  Immunization History  Administered Date(s) Administered  . Influenza-Unspecified 05/10/2014, 05/02/2016, 05/08/2017  . Pneumococcal Polysaccharide-23 01/02/2014  . Tdap 01/01/2014, 04/16/2017   Pertinent  Health Maintenance Due  Topic Date Due  . INFLUENZA VACCINE  Completed  . DEXA SCAN  Completed  . PNA vac Low Risk Adult  Completed   Fall Risk  04/23/2017  Falls in the past year? No   Functional Status Survey:    Vitals:   10/16/17 1541  BP: 110/60  Pulse: (!) 58  Resp: 14  Temp: (!) 97 F (36.1 C)  TempSrc: Oral    Physical Exam   In general this is a somewhat frail elderly female in no distress lying comfortably in her [place] chair.  Her skin is warm and dry.  Eyes pupils appear to be reactive to light the medial conjunctivia. of her left eye does appear to have erythema --- I do not really note any drainage or crusting.  Right eye appears clear conjunctive and sclera conjunctive are clear-visual acuity appears intact both eyes-there is no periorbital edema or erythema.  Musculoskeletal-again continues but appears with baseline upper extremity strength does have lower extremity weakness with a history of bilateral hip fractures-  Neurologic continues at baseline cranial nerves appear to be intact he does not speak much.  Psych she does have findings consistent with dementia is not really speaking does make eye contact.    Labs reviewed: Recent Labs    01/20/17 0727 05/13/17 0700 10/05/17 0830  NA 138 140 141  K 4.0 4.1 3.9  CL 105 107 105  CO2 26 23 27   GLUCOSE 96 98 89  BUN 20 19 27*  CREATININE 0.63 0.66 0.69  CALCIUM 9.2 9.0 9.2   Recent  Labs    05/13/17 0700  AST 19  ALT 12*  ALKPHOS 56  BILITOT 0.5  PROT 6.8  ALBUMIN 3.6   Recent Labs    01/20/17 0727 05/13/17 0700 08/26/17 0630  WBC 4.2 5.1 4.5  NEUTROABS 2.1  --  2.4  HGB 12.3 12.9 10.5*  HCT 38.2 40.4 33.6*  MCV 95.3 98.3 98.2  PLT 174 173 228   Lab Results  Component Value Date   TSH 4.101 08/26/2017   No results found for: HGBA1C No results found for: CHOL, HDL, LDLCALC, LDLDIRECT, TRIG, CHOLHDL  Significant Diagnostic Results in last 30 days:  No results found.  Assessment/Plan  #1-suspected subconjunctival hemorrhage-at this point will monitor I suspect this will slowly resolve if it does not certainly will need to reassess but otherwise exam was quite benign--without significant--discharge or periorbital changes  Vital signs are stable she is afebrile  #2 history of allergic rhinitis-I do note  she is on Claritin routinely but this presentation does not fit and allergic rhinitis etiology at this point will monitor but I suspect this is more of a subconjunctival hemorrhage causing changes in the eye   #3 history of bilateral hip fractures she appears to be stable pain is controlled she is on a Duragesic patch appears comfortable in her [place] chair today  [phone]

## 2017-10-28 ENCOUNTER — Other Ambulatory Visit: Payer: Self-pay

## 2017-10-28 MED ORDER — FENTANYL 12 MCG/HR TD PT72: MEDICATED_PATCH | TRANSDERMAL | 0 refills | Status: DC

## 2017-10-28 NOTE — Telephone Encounter (Signed)
RX Fax for [facility]@ [phone]  

## 2017-11-05 DIAGNOSIS — R262 Difficulty in walking, not elsewhere classified: Secondary | ICD-10-CM | POA: Diagnosis not present

## 2017-11-05 DIAGNOSIS — B351 Tinea unguium: Secondary | ICD-10-CM | POA: Diagnosis not present

## 2017-11-05 DIAGNOSIS — I739 Peripheral vascular disease, unspecified: Secondary | ICD-10-CM | POA: Diagnosis not present

## 2017-11-09 ENCOUNTER — Encounter: Payer: Self-pay | Admitting: Internal Medicine

## 2017-11-09 ENCOUNTER — Non-Acute Institutional Stay (SKILLED_NURSING_FACILITY): Payer: Medicare Other | Admitting: Internal Medicine

## 2017-11-09 DIAGNOSIS — M810 Age-related osteoporosis without current pathological fracture: Secondary | ICD-10-CM

## 2017-11-09 DIAGNOSIS — I10 Essential (primary) hypertension: Secondary | ICD-10-CM

## 2017-11-09 DIAGNOSIS — E039 Hypothyroidism, unspecified: Secondary | ICD-10-CM

## 2017-11-09 DIAGNOSIS — F0391 Unspecified dementia with behavioral disturbance: Secondary | ICD-10-CM | POA: Diagnosis not present

## 2017-11-09 NOTE — Progress Notes (Signed)
Location:   [place] Room Number: 141/W Place of Service:  SNF (31) Provider:  Clydene Fake, MD  Patient Care Team: Virgie Dad, MD as PCP - General (Internal Medicine)  Extended Emergency Contact Information Primary Emergency Contact: Noreene Larsson of [place] Phone: [phone] Mobile Phone: [phone] Relation: Niece Secondary Emergency Contact: Harvie Bridge States of [place] Phone: [phone] Mobile Phone: [phone] Relation: Sister  Code Status:  DNR Goals of care: Advanced Directive information Advanced Directives 11/09/2017  Does Patient Have a Medical Advance Directive? Yes  Type of Advance Directive Out of facility DNR (pink MOST or yellow form)  Does patient want to make changes to medical advance directive? No - Patient declined  Copy of [place] in Chart? No - copy requested  Would patient like information on creating a medical advance directive? No - Patient declined  Pre-existing out of facility DNR order (yellow form or pink MOST form) -     Chief Complaint  Patient presents with  . Medical Management of Chronic Issues    Routine Visit    HPI:  Pt is a 84 y.o. female seen today for medical management of chronic diseases.   Patient has h/o Advanced dementia and Osteoporosis . She Had Sustained Right hip fracture which was followed by Left Hip fracture due to Fall in 07/16. Family decided not to get surgical repair. She has been Pain management in Facility.  Patient is doing well in facility and has been stable.  Patient unable to give me any history.  Discussed with the nurses and she has no new nursing issues.  Her pain is controlled and her appetite is fair.  She her weight today was 125 pounds which is less than her usual weight of 132.    Past Medical History:  Diagnosis Date  . Dementia   . Osteoarthritis    Past Surgical History:  Procedure  Laterality Date  . BREAST SURGERY    . EYE SURGERY    . HIP ARTHROPLASTY Right 01/01/2014   Procedure: BIPOLAR ARTHROPLASTY RIGHT HIP;  Surgeon: Carole Civil, MD;  Location: [facility];  Service: Orthopedics;  Laterality: Right;    Allergies  Allergen Reactions  . Morphine And Related     Increased confusion    Outpatient Encounter Medications as of 11/09/2017  Medication Sig  . acetaminophen (TYLENOL) 325 MG suppository Place 325 mg rectally every 4 (four) hours as needed.  Marland Kitchen acetaminophen (TYLENOL) 500 MG tablet Take  two tablets by mouth every 6 hours as needed for mild to moderate pain. DNE 3000mg  Tylenol in 24 hours from all sources.  Marland Kitchen acetaminophen (TYLENOL) 500 MG tablet Take 500 mg by mouth every 6 (six) hours as needed.  . calcium-vitamin D (OSCAL WITH D) 500-200 MG-UNIT tablet Take 1 tablet by mouth 2 (two) times daily.  . citalopram (CELEXA) 10 MG tablet Take 20 mg by mouth daily.   Marland Kitchen Dextromethorphan-Guaifenesin 5-100 MG/5ML LIQD Give 15 ml by mouth every 6 hours prn  . fentaNYL (DURAGESIC - DOSED MCG/HR) 12 MCG/HR Apply one patch topically as directed every 72 hours (every 3 days) note site. Remove old patch before applying new patch  . HYDROcodone-acetaminophen (HYCET) 7.5-325 mg/15 ml solution Give 27ml by mouth every 6 hours as needed for pain. Max APAP 3gm/24 hours from all sources  . levothyroxine (SYNTHROID, LEVOTHROID) 25 MCG tablet Take 37.5 mcg by mouth daily before breakfast.   .  loratadine (CLARITIN) 10 MG tablet Take 10 mg by mouth daily.  . memantine (NAMENDA) 5 MG tablet Take 5 mg by mouth daily.  . NON FORMULARY Magic cup give once a  day  . Nutritional Supplements (ENSURE ENLIVE PO) One by mouth daily  . senna-docusate (SENOKOT-S) 8.6-50 MG per tablet Take 1 tablet by mouth at bedtime.   No facility-administered encounter medications on file as of 11/09/2017.      Review of Systems  Unable to perform ROS: Dementia    Immunization History  Administered  Date(s) Administered  . Influenza-Unspecified 05/10/2014, 05/02/2016, 05/08/2017  . Pneumococcal Polysaccharide-23 01/02/2014  . Tdap 01/01/2014, 04/16/2017   Pertinent  Health Maintenance Due  Topic Date Due  . INFLUENZA VACCINE  03/04/2018  . DEXA SCAN  Completed  . PNA vac Low Risk Adult  Completed   Fall Risk  04/23/2017  Falls in the past year? No   Functional Status Survey:    Vitals:   11/09/17 1546  BP: (!) 137/54  Pulse: 67  Resp: 18  Temp: 98.6 F (37 C)  TempSrc: Oral  SpO2: 96%  Weight: 125 lb 4.8 oz (56.8 kg)  Height: 5\' 3"  (1.6 m)   Body mass index is 22.2 kg/m. Physical Exam  Constitutional: She appears well-developed and well-nourished.  HENT:  Head: Normocephalic.  Mouth/Throat: Oropharynx is clear and moist.  Eyes: Pupils are equal, round, and reactive to light.  Neck: Neck supple.  Cardiovascular: Normal rate and regular rhythm.  Pulmonary/Chest: Effort normal and breath sounds normal.  Abdominal: Soft. Bowel sounds are normal.  Musculoskeletal: She exhibits no edema.  Neurological: She is alert.  Doesn't Follow Any commands  Skin: Skin is warm and dry.  Psychiatric: She has a normal mood and affect. Her behavior is normal.    Labs reviewed: Recent Labs    01/20/17 0727 05/13/17 0700 10/05/17 0830  NA 138 140 141  K 4.0 4.1 3.9  CL 105 107 105  CO2 26 23 27   GLUCOSE 96 98 89  BUN 20 19 27*  CREATININE 0.63 0.66 0.69  CALCIUM 9.2 9.0 9.2   Recent Labs    05/13/17 0700  AST 19  ALT 12*  ALKPHOS 56  BILITOT 0.5  PROT 6.8  ALBUMIN 3.6   Recent Labs    01/20/17 0727 05/13/17 0700 08/26/17 0630  WBC 4.2 5.1 4.5  NEUTROABS 2.1  --  2.4  HGB 12.3 12.9 10.5*  HCT 38.2 40.4 33.6*  MCV 95.3 98.3 98.2  PLT 174 173 228   Lab Results  Component Value Date   TSH 4.101 08/26/2017   No results found for: HGBA1C No results found for: CHOL, HDL, LDLCALC, LDLDIRECT, TRIG, CHOLHDL  Significant Diagnostic Results in last 30 days:   No results found.  Assessment/Plan  Hypothyroidism TSH was WNL in 01/19 .continue same dose of Synthroid  Dementia with behavioral disturbance On Namenda Does not need any Antipsychotics right now.  Essential hypertension Not on any meds. Stable  Senile osteoporosis No Aggressive treatment Pain Control for Femoral fractures.  Depression Continue Celexa Mild Weight Loss D/w nurses they have not noticed any change in her Appetite. Will continue to monitor her weight. Anemia Will repeat labs. No aggressive work up.  Family/ staff Communication:   Labs/tests ordered:  BMP, CBC  Total time spent in this patient care encounter was 25_ minutes; greater than 50% of the visit spent counseling patient, reviewing records , Labs and coordinating care for problems addressed at  this encounter.

## 2017-11-17 ENCOUNTER — Encounter (HOSPITAL_COMMUNITY)
Admission: RE | Admit: 2017-11-17 | Discharge: 2017-11-17 | Disposition: A | Discharge status: Home / Self Care | Payer: Medicare Other | Source: Skilled Nursing Facility | Attending: Internal Medicine | Admitting: Internal Medicine

## 2017-11-17 DIAGNOSIS — Z8781 Personal history of (healed) traumatic fracture: Secondary | ICD-10-CM | POA: Diagnosis not present

## 2017-11-17 DIAGNOSIS — E039 Hypothyroidism, unspecified: Secondary | ICD-10-CM | POA: Diagnosis not present

## 2017-11-17 DIAGNOSIS — F411 Generalized anxiety disorder: Secondary | ICD-10-CM | POA: Diagnosis not present

## 2017-11-17 DIAGNOSIS — I10 Essential (primary) hypertension: Secondary | ICD-10-CM | POA: Insufficient documentation

## 2017-11-17 DIAGNOSIS — F0390 Unspecified dementia without behavioral disturbance: Secondary | ICD-10-CM | POA: Insufficient documentation

## 2017-11-17 DIAGNOSIS — M810 Age-related osteoporosis without current pathological fracture: Secondary | ICD-10-CM | POA: Insufficient documentation

## 2017-11-17 LAB — BASIC METABOLIC PANEL
ANION GAP: 9 (ref 5–15)
BUN: 23 mg/dL — ABNORMAL HIGH (ref 6–20)
CO2: 26 mmol/L (ref 22–32)
Calcium: 8.9 mg/dL (ref 8.9–10.3)
Chloride: 104 mmol/L (ref 101–111)
Creatinine, Ser: 0.66 mg/dL (ref 0.44–1.00)
GFR calc Af Amer: >60 mL/min (ref >60)
Glucose, Bld: 94 mg/dL (ref 65–99)
Potassium: 4 mmol/L (ref 3.5–5.1)
SODIUM: 139 mmol/L (ref 135–145)

## 2017-11-17 LAB — CBC WITH DIFFERENTIAL/PLATELET
BASOS ABS: 0 10*3/uL (ref 0.0–0.1)
Basophils Relative: 0 %
EOS ABS: 0.1 10*3/uL (ref 0.0–0.7)
Eosinophils Relative: 2 %
HCT: 36.5 % (ref 36.0–46.0)
Hemoglobin: 11.4 g/dL — ABNORMAL LOW (ref 12.0–15.0)
LYMPHS PCT: 33 %
Lymphs Abs: 1.5 10*3/uL (ref 0.7–4.0)
MCH: 30.6 pg (ref 26.0–34.0)
MCHC: 31.2 g/dL (ref 30.0–36.0)
MCV: 98.1 fL (ref 78.0–100.0)
Monocytes Absolute: 1 10*3/uL (ref 0.1–1.0)
Monocytes Relative: 21 %
Neutro Abs: 2 10*3/uL (ref 1.7–7.7)
Neutrophils Relative %: 44 %
PLATELETS: 163 10*3/uL (ref 150–400)
RBC: 3.72 MIL/uL — AB (ref 3.87–5.11)
RDW: 13 % (ref 11.5–15.5)
WBC: 4.6 10*3/uL (ref 4.0–10.5)

## 2017-11-27 ENCOUNTER — Other Ambulatory Visit: Payer: Self-pay

## 2017-11-27 MED ORDER — FENTANYL 12 MCG/HR TD PT72: MEDICATED_PATCH | TRANSDERMAL | 0 refills | Status: DC

## 2017-11-27 NOTE — Telephone Encounter (Signed)
RX Fax for [employer] [phone]  This encounter was created in error - please disregard.

## 2017-11-27 NOTE — Telephone Encounter (Signed)
RX Fax for [facility]@ [phone]  

## 2017-12-02 ENCOUNTER — Non-Acute Institutional Stay (SKILLED_NURSING_FACILITY): Payer: Medicare Other | Admitting: Internal Medicine

## 2017-12-02 ENCOUNTER — Encounter: Payer: Self-pay | Admitting: Internal Medicine

## 2017-12-02 DIAGNOSIS — E039 Hypothyroidism, unspecified: Secondary | ICD-10-CM | POA: Diagnosis not present

## 2017-12-02 DIAGNOSIS — F0391 Unspecified dementia with behavioral disturbance: Secondary | ICD-10-CM | POA: Diagnosis not present

## 2017-12-02 DIAGNOSIS — I10 Essential (primary) hypertension: Secondary | ICD-10-CM

## 2017-12-02 DIAGNOSIS — S72002A Fracture of unspecified part of neck of left femur, initial encounter for closed fracture: Secondary | ICD-10-CM | POA: Diagnosis not present

## 2017-12-02 DIAGNOSIS — M810 Age-related osteoporosis without current pathological fracture: Secondary | ICD-10-CM | POA: Diagnosis not present

## 2017-12-02 DIAGNOSIS — S72001G Fracture of unspecified part of neck of right femur, subsequent encounter for closed fracture with delayed healing: Secondary | ICD-10-CM

## 2017-12-02 NOTE — Progress Notes (Signed)
Location:   [place] Room Number: 141/W Place of Service:  SNF [phone]) Provider:  Freddi Starr, MD  Patient Care Team: Virgie Dad, MD as PCP - General (Internal Medicine)  Extended Emergency Contact Information Primary Emergency Contact: Noreene Larsson of [place] Phone: [phone] Mobile Phone: [phone] Relation: Niece Secondary Emergency Contact: Harvie Bridge States of [place] Phone: [phone] Mobile Phone: [phone] Relation: Sister  Code Status:  DNR Goals of care: Advanced Directive information Advanced Directives 12/02/2017  Does Patient Have a Medical Advance Directive? Yes  Type of Advance Directive Out of facility DNR (pink MOST or yellow form)  Does patient want to make changes to medical advance directive? No - Patient declined  Copy of [place] in Chart? No - copy requested  Would patient like information on creating a medical advance directive? No - Patient declined  Pre-existing out of facility DNR order (yellow form or pink MOST form) -     Chief Complaint  Patient presents with  . Medical Management of Chronic Issues    Routine Visit  For medical management of chronic medical conditions including bilateral hip fractures-hypo thyroidism-osteoporosis-hypertension- depression  HPI:  Pt is a 84 y.o. female seen today for medical management of chronic diseases.--As noted above.  Has not reported any recent acute issues Dr. Lyndel Safe recently saw her and noted some mild weight loss-this appears to have stabilized she is gained about 3 pounds-she is on supplementation including Magic cup and ensure  She does have a history of advanced dementia and continues on Namenda-she is also had a history of a right hip fracture which was followed by left hip fracture after a fall back in July 2016- family has decided not to get surgical repair she is in pain management with  Duragesic patch and Norco for breakthrough pain which appears to be effective.  She does have calcium with vitamin D with a history of senile osteoporosis.  She also has a history of hypothyroidism TSH has been within normal range it was 4.101 back in January.  She also has a history of hypertension on no medications blood pressure today is stable at 125/70.  Patient has significant dementia and cannot really give any review of systems   Past Medical History:  Diagnosis Date  . Dementia   . Osteoarthritis    Past Surgical History:  Procedure Laterality Date  . BREAST SURGERY    . EYE SURGERY    . HIP ARTHROPLASTY Right 01/01/2014   Procedure: BIPOLAR ARTHROPLASTY RIGHT HIP;  Surgeon: Carole Civil, MD;  Location: [facility];  Service: Orthopedics;  Laterality: Right;    Allergies  Allergen Reactions  . Morphine And Related     Increased confusion    Outpatient Encounter Medications as of 12/02/2017  Medication Sig  . acetaminophen (TYLENOL) 325 MG suppository Place 325 mg rectally every 4 (four) hours as needed.  Marland Kitchen acetaminophen (TYLENOL) 500 MG tablet Take  two tablets by mouth every 6 hours as needed for mild to moderate pain. DNE 3000mg  Tylenol in 24 hours from all sources.  Marland Kitchen acetaminophen (TYLENOL) 500 MG tablet Take 500 mg by mouth every 6 (six) hours as needed.  . calcium-vitamin D (OSCAL WITH D) 500-200 MG-UNIT tablet Take 1 tablet by mouth 2 (two) times daily.  . citalopram (CELEXA) 10 MG tablet Take 20 mg by mouth daily.   Marland Kitchen Dextromethorphan-Guaifenesin 5-100 MG/5ML LIQD Give 15 ml by  mouth every 6 hours prn  . fentaNYL (DURAGESIC - DOSED MCG/HR) 12 MCG/HR Apply one patch topically as directed every 72 hours (every 3 days) note site. Remove old patch before applying new patch  . HYDROcodone-acetaminophen (HYCET) 7.5-325 mg/15 ml solution Give 31ml by mouth every 6 hours as needed for pain. Max APAP 3gm/24 hours from all sources  . levothyroxine (SYNTHROID, LEVOTHROID)  25 MCG tablet Take 37.5 mcg by mouth daily before breakfast.   . loratadine (CLARITIN) 10 MG tablet Take 10 mg by mouth daily.  . memantine (NAMENDA) 5 MG tablet Take 5 mg by mouth daily.  . NON FORMULARY Magic cup give once a  day  . Nutritional Supplements (ENSURE ENLIVE PO) One by mouth daily  . senna-docusate (SENOKOT-S) 8.6-50 MG per tablet Take 1 tablet by mouth at bedtime.   No facility-administered encounter medications on file as of 12/02/2017.      Review of Systems   Unobtainable secondary to dementia please see HPI  Immunization History  Administered Date(s) Administered  . Influenza-Unspecified 05/10/2014, 05/02/2016, 05/08/2017  . Pneumococcal Polysaccharide-23 01/02/2014  . Tdap 01/01/2014, 04/16/2017   Pertinent  Health Maintenance Due  Topic Date Due  . INFLUENZA VACCINE  03/04/2018  . DEXA SCAN  Completed  . PNA vac Low Risk Adult  Completed   Fall Risk  04/23/2017  Falls in the past year? No   Functional Status Survey:    Vitals:   12/02/17 1539  BP: 125/70  Pulse: 68  Resp: 20  Temp: (!) 97.2 F (36.2 C)  TempSrc: Oral  SpO2: 95%  Weight: 128 lb 6.4 oz (58.2 kg)  Height: 5\' 3"  (1.6 m)   Body mass index is 22.75 kg/m. Physical Exam   General this is a frail elderly female in no distress lying comfortably in bed.  Her skin is warm and dry.  Eyes sclera and conjunctive are clear visual acuity appears grossly intact.  Oropharynx appears to be clear limited exam since she did not open her mouth very wide she does not follow verbal commands.  Chest is clear to auscultation again did not take deep breaths because she does not follow verbal commands.  Heart is regular rate and rhythm without murmur gallop or rub she does not appear to have significant lower extremity edema.  Abdomen is soft nontender with positive bowel sounds.  Musculoskeletal she does not have significant edema cannot really appreciate any notable deformities at the hip-she  does have protective boots on.  Neurologic she is alert makes eye contact does not really speak much.  Cranial nerves appear grossly intact.  Psych she does make eye contact but does not follow verbal commands she was not agitated with exam findings consistent with significant dementia  Labs reviewed: Recent Labs    05/13/17 0700 10/05/17 0830 11/17/17 0800  NA 140 141 139  K 4.1 3.9 4.0  CL 107 105 104  CO2 23 27 26   GLUCOSE 98 89 94  BUN 19 27* 23*  CREATININE 0.66 0.69 0.66  CALCIUM 9.0 9.2 8.9   Recent Labs    05/13/17 0700  AST 19  ALT 12*  ALKPHOS 56  BILITOT 0.5  PROT 6.8  ALBUMIN 3.6   Recent Labs    01/20/17 0727 05/13/17 0700 08/26/17 0630 11/17/17 0800  WBC 4.2 5.1 4.5 4.6  NEUTROABS 2.1  --  2.4 2.0  HGB 12.3 12.9 10.5* 11.4*  HCT 38.2 40.4 33.6* 36.5  MCV 95.3 98.3 98.2 98.1  PLT 174 173 228 163   Lab Results  Component Value Date   TSH 4.101 08/26/2017   No results found for: HGBA1C No results found for: CHOL, HDL, LDLCALC, LDLDIRECT, TRIG, CHOLHDL  Significant Diagnostic Results in last 30 days:  No results found.  Assessment/Plan  #1-history  of advanced dementia again she appears actually have gained a small amount of weight she is on supplements at this point continue to monitor encourage p.o. Intake  She continues on Namenda.  2 history of hypertension currently not on any medications but this appears to be stable.  3.  History of senile osteoporosis not on aggressive treatment she is on calcium with vitamin D.  4.  History of bilateral hip fractures again no surgical intervention per family wishes- this is on pain control she is on a Duragesic patch and appears to have tolerated this well she does have Norco for breakthrough pain.  5 history of hypothyroidism TSH was within normal limits.  6.  History of anemia this appears stable with a hemoglobin of 11.4 on lab done last month.  7 history of depression she continues on  Celexa this appears to be stable as well she does not speak much--staff does not report any acute changes   [phone]

## 2017-12-18 DIAGNOSIS — F0391 Unspecified dementia with behavioral disturbance: Secondary | ICD-10-CM | POA: Diagnosis not present

## 2017-12-18 DIAGNOSIS — F4323 Adjustment disorder with mixed anxiety and depressed mood: Secondary | ICD-10-CM | POA: Diagnosis not present

## 2018-01-01 ENCOUNTER — Other Ambulatory Visit: Payer: Self-pay

## 2018-01-01 MED ORDER — FENTANYL 12 MCG/HR TD PT72: MEDICATED_PATCH | TRANSDERMAL | 0 refills | Status: DC

## 2018-01-01 NOTE — Telephone Encounter (Signed)
RX Fax for [facility]@ [phone]  

## 2018-01-29 ENCOUNTER — Other Ambulatory Visit: Payer: Self-pay

## 2018-01-29 MED ORDER — FENTANYL 12 MCG/HR TD PT72: MEDICATED_PATCH | TRANSDERMAL | 0 refills | Status: DC

## 2018-01-29 NOTE — Telephone Encounter (Signed)
RX Fax for [facility]@ [phone]  

## 2018-02-01 DIAGNOSIS — R262 Difficulty in walking, not elsewhere classified: Secondary | ICD-10-CM | POA: Diagnosis not present

## 2018-02-01 DIAGNOSIS — I739 Peripheral vascular disease, unspecified: Secondary | ICD-10-CM | POA: Diagnosis not present

## 2018-02-01 DIAGNOSIS — B351 Tinea unguium: Secondary | ICD-10-CM | POA: Diagnosis not present

## 2018-02-08 ENCOUNTER — Non-Acute Institutional Stay (SKILLED_NURSING_FACILITY): Payer: Medicare Other | Admitting: Internal Medicine

## 2018-02-08 ENCOUNTER — Encounter: Payer: Self-pay | Admitting: Internal Medicine

## 2018-02-08 DIAGNOSIS — H6690 Otitis media, unspecified, unspecified ear: Secondary | ICD-10-CM

## 2018-02-08 DIAGNOSIS — E039 Hypothyroidism, unspecified: Secondary | ICD-10-CM

## 2018-02-08 DIAGNOSIS — I10 Essential (primary) hypertension: Secondary | ICD-10-CM | POA: Diagnosis not present

## 2018-02-08 DIAGNOSIS — F0391 Unspecified dementia with behavioral disturbance: Secondary | ICD-10-CM | POA: Diagnosis not present

## 2018-02-08 DIAGNOSIS — S72002A Fracture of unspecified part of neck of left femur, initial encounter for closed fracture: Secondary | ICD-10-CM | POA: Diagnosis not present

## 2018-02-08 NOTE — Progress Notes (Deleted)
Location:   [place] Room Number: 141/W Place of Service:  SNF [phone]) Provider:  Freddi Starr, MD  Patient Care Team: Virgie Dad, MD as PCP - General (Internal Medicine)  Extended Emergency Contact Information Primary Emergency Contact: Noreene Larsson of [place] Phone: [phone] Mobile Phone: [phone] Relation: Niece Secondary Emergency Contact: Harvie Bridge States of [place] Phone: [phone] Mobile Phone: [phone] Relation: Sister  Code Status:  DNR Goals of care: Advanced Directive information Advanced Directives 02/08/2018  Does Patient Have a Medical Advance Directive? Yes  Type of Advance Directive Out of facility DNR (pink MOST or yellow form)  Does patient want to make changes to medical advance directive? No - Patient declined  Copy of [place] in Chart? No - copy requested  Would patient like information on creating a medical advance directive? No - Patient declined  Pre-existing out of facility DNR order (yellow form or pink MOST form) -     Chief Complaint  Patient presents with  . Acute Visit    Patients c/o Ear Wax    HPI:  Pt is a 84 y.o. female seen today for an acute visit for    Past Medical History:  Diagnosis Date  . Dementia   . Osteoarthritis    Past Surgical History:  Procedure Laterality Date  . BREAST SURGERY    . EYE SURGERY    . HIP ARTHROPLASTY Right 01/01/2014   Procedure: BIPOLAR ARTHROPLASTY RIGHT HIP;  Surgeon: Carole Civil, MD;  Location: [facility];  Service: Orthopedics;  Laterality: Right;    Allergies  Allergen Reactions  . Morphine And Related     Increased confusion    Outpatient Encounter Medications as of 02/08/2018  Medication Sig  . acetaminophen (TYLENOL) 325 MG suppository Place 325 mg rectally every 4 (four) hours as needed.  Marland Kitchen acetaminophen (TYLENOL) 500 MG tablet Take  two tablets by mouth every 6  hours as needed for mild to moderate pain. DNE 3000mg  Tylenol in 24 hours from all sources.  Marland Kitchen acetaminophen (TYLENOL) 500 MG tablet Take 500 mg by mouth every 6 (six) hours as needed.  . calcium-vitamin D (OSCAL WITH D) 500-200 MG-UNIT tablet Take 1 tablet by mouth 2 (two) times daily.  . citalopram (CELEXA) 10 MG tablet Take 20 mg by mouth daily.   Marland Kitchen Dextromethorphan-Guaifenesin 5-100 MG/5ML LIQD Give 15 ml by mouth every 6 hours prn  . fentaNYL (DURAGESIC - DOSED MCG/HR) 12 MCG/HR Apply one patch topically as directed every 72 hours (every 3 days) note site. Remove old patch before applying new patch  . HYDROcodone-acetaminophen (HYCET) 7.5-325 mg/15 ml solution Give 56ml by mouth every 6 hours as needed for pain. Max APAP 3gm/24 hours from all sources  . levothyroxine (SYNTHROID, LEVOTHROID) 25 MCG tablet Take 37.5 mcg by mouth daily before breakfast.   . loratadine (CLARITIN) 10 MG tablet Take 10 mg by mouth daily.  . memantine (NAMENDA) 5 MG tablet Take 5 mg by mouth daily.  . NON FORMULARY Magic cup give once a  day  . Nutritional Supplements (ENSURE ENLIVE PO) One by mouth daily  . senna-docusate (SENOKOT-S) 8.6-50 MG per tablet Take 1 tablet by mouth at bedtime.   No facility-administered encounter medications on file as of 02/08/2018.     Review of Systems  Immunization History  Administered Date(s) Administered  . Influenza-Unspecified 05/10/2014, 05/02/2016, 05/08/2017  . Pneumococcal Polysaccharide-23 01/02/2014  . Tdap  01/01/2014, 04/16/2017   Pertinent  Health Maintenance Due  Topic Date Due  . INFLUENZA VACCINE  03/04/2018  . DEXA SCAN  Completed  . PNA vac Low Risk Adult  Completed   Fall Risk  04/23/2017  Falls in the past year? No   Functional Status Survey:    Vitals:   02/08/18 1409  BP: 100/60   There is no height or weight on file to calculate BMI. Physical Exam  Labs reviewed: Recent Labs    05/13/17 0700 10/05/17 0830 11/17/17 0800  NA 140 141  139  K 4.1 3.9 4.0  CL 107 105 104  CO2 23 27 26   GLUCOSE 98 89 94  BUN 19 27* 23*  CREATININE 0.66 0.69 0.66  CALCIUM 9.0 9.2 8.9   Recent Labs    05/13/17 0700  AST 19  ALT 12*  ALKPHOS 56  BILITOT 0.5  PROT 6.8  ALBUMIN 3.6   Recent Labs    05/13/17 0700 08/26/17 0630 11/17/17 0800  WBC 5.1 4.5 4.6  NEUTROABS  --  2.4 2.0  HGB 12.9 10.5* 11.4*  HCT 40.4 33.6* 36.5  MCV 98.3 98.2 98.1  PLT 173 228 163   Lab Results  Component Value Date   TSH 4.101 08/26/2017   No results found for: HGBA1C No results found for: CHOL, HDL, LDLCALC, LDLDIRECT, TRIG, CHOLHDL  Significant Diagnostic Results in last 30 days:  No results found.  Assessment/Plan There are no diagnoses linked to this encounter.      Oralia Manis, [place]

## 2018-02-08 NOTE — Progress Notes (Signed)
Location:   [facility] Room Number: 141/W Place of Service:  SNF [phone]) Provider:  Granville Lewis PA-C  Virgie Dad, MD  Patient Care Team: Virgie Dad, MD as PCP - General (Internal Medicine)  Extended Emergency Contact Information Primary Emergency Contact: Noreene Larsson of [place] Phone: [phone] Mobile Phone: [phone] Relation: Niece Secondary Emergency Contact: Harvie Bridge States of [place] Phone: [phone] Mobile Phone: [phone] Relation: Sister  Code Status:  DNR Goals of care: Advanced Directive information Advanced Directives 02/08/2018  Does Patient Have a Medical Advance Directive? Yes  Type of Advance Directive Out of facility DNR (pink MOST or yellow form)  Does patient want to make changes to medical advance directive? No - Patient declined  Copy of [place] in Chart? No - copy requested  Would patient like information on creating a medical advance directive? No - Patient declined  Pre-existing out of facility DNR order (yellow form or pink MOST form) -     Chief Complaint  Patient presents with  . Medical Management of Chronic Issues    This is a Routine Visit  . Acute Visit    [place] c/o Ear Wax  Medical management of chronic medical issues including history of right and left hip fractures- hypothyroidism-hypertension-depression- severe dementia-osteoporosis  Acute visit secondary to concerns for possible earwax  HPI:  Pt is a 84 y.o. female seen today for medical management of chronic diseases.  As noted above.  Also an acute visit for concerns that patient may have earwax.  Patient is a long-term resident of facility and does have significant dementia continues on low-dose Namenda- appears she is lost a couple pounds over the past couple months---she is on supplements--nursing does not really report any acute issues but sitter believes that she may have some  increased earwax  She does have a history of a right hip fracture which was followed by left hip fracture after a fall in July 2016  Family has opted for nonsurgical intervention and pain control- no aggressive intervention-  He continues on a Duragesic patch which she has tolerated well and does have Vicodin as needed for breakthrough pain   She is also on calcium with vitamin D with a history of osteoporosis.  She also has a history of hypothyroidism she is on Synthroid TSH was 4.101 on lab done in January will update this  She also has a history of hypertension but this appears to be stable without any medication blood pressure today 110/56- previously 112/62-109/64.  Her sitter as noted above believe she may have some earwax-I did look in both ears appear to have some her left ear- right ear did appear to have possibly a small amount of erythema concerning for a possible ear infection  She does not appear to be having any ear pain     Past Medical History:  Diagnosis Date  . Dementia   . Osteoarthritis    Past Surgical History:  Procedure Laterality Date  . BREAST SURGERY    . EYE SURGERY    . HIP ARTHROPLASTY Right 01/01/2014   Procedure: BIPOLAR ARTHROPLASTY RIGHT HIP;  Surgeon: Carole Civil, MD;  Location: [facility];  Service: Orthopedics;  Laterality: Right;    Allergies  Allergen Reactions  . Morphine And Related     Increased confusion    Outpatient Encounter Medications as of 02/08/2018  Medication Sig  . acetaminophen (TYLENOL) 325 MG suppository Place 325 mg  rectally every 4 (four) hours as needed.  Marland Kitchen acetaminophen (TYLENOL) 500 MG tablet Take  two tablets by mouth every 6 hours as needed for mild to moderate pain. DNE 3000mg  Tylenol in 24 hours from all sources.  Marland Kitchen acetaminophen (TYLENOL) 500 MG tablet Take 500 mg by mouth every 6 (six) hours as needed.  . calcium-vitamin D (OSCAL WITH D) 500-200 MG-UNIT tablet Take 1 tablet by mouth 2 (two) times daily.    . citalopram (CELEXA) 10 MG tablet Take 20 mg by mouth daily.   Marland Kitchen Dextromethorphan-Guaifenesin 5-100 MG/5ML LIQD Give 15 ml by mouth every 6 hours prn  . fentaNYL (DURAGESIC - DOSED MCG/HR) 12 MCG/HR Apply one patch topically as directed every 72 hours (every 3 days) note site. Remove old patch before applying new patch  . HYDROcodone-acetaminophen (HYCET) 7.5-325 mg/15 ml solution Give 84ml by mouth every 6 hours as needed for pain. Max APAP 3gm/24 hours from all sources  . levothyroxine (SYNTHROID, LEVOTHROID) 25 MCG tablet Take 37.5 mcg by mouth daily before breakfast.   . loratadine (CLARITIN) 10 MG tablet Take 10 mg by mouth daily.  . memantine (NAMENDA) 5 MG tablet Take 5 mg by mouth daily.  . NON FORMULARY Magic cup give once a  day  . Nutritional Supplements (ENSURE ENLIVE PO) One by mouth daily  . senna-docusate (SENOKOT-S) 8.6-50 MG per tablet Take 1 tablet by mouth at bedtime.   No facility-administered encounter medications on file as of 02/08/2018.      Review of Systems   Is essentially unobtainable secondary to dementia please see HPI  Immunization History  Administered Date(s) Administered  . Influenza-Unspecified 05/10/2014, 05/02/2016, 05/08/2017  . Pneumococcal Polysaccharide-23 01/02/2014  . Tdap 01/01/2014, 04/16/2017   Pertinent  Health Maintenance Due  Topic Date Due  . INFLUENZA VACCINE  03/04/2018  . DEXA SCAN  Completed  . PNA vac Low Risk Adult  Completed   Fall Risk  04/23/2017  Falls in the past year? No   Functional Status Survey:    Vitals:   02/08/18 1409  SpO2: 95%  Weight: 125 lb 9.6 oz (57 kg)  Height: 5\' 3"  (1.6 m)  Temperature is 97.8 pulse is 78 respirations 17-blood pressure 110/56 O2 saturation is 97% on room air Body mass index is 22.25 kg/m. Physical Exam In general this is a frail elderly female in no distress lying comfortably in bed.  Skin is warm and dry.  Eyes she has prescription lenses sclera and conjunctive are clear  visual acuity appears to be intact she makes eye contact  Oropharynx was difficult to assess since patient did not really open her mouth  Ears she has a moderate amount of wax in the left ear tympanic membrane was visualized--I do not see any erythema or drainage  Right ear does does appear to have a small amount of erythema anterior to the tympanic membrane---  Heart is regular rate and rhythm without murmur gallop or rub she does not appear to have significant lower extremity edema  Abdomen is soft nontender with positive bowel sounds  Muscular low skeletal does not appear to have significant edema- she does have protective boots on bilaterally--continues with significant lower extremity weakness   Neurologic she is alert she does make eye contact is not really speaking today  Psych --findings as noted above does not really follow verbal commands  Labs reviewed: Recent Labs    05/13/17 0700 10/05/17 0830 11/17/17 0800  NA 140 141 139  K 4.1 3.9 4.0  CL 107 105 104  CO2 23 27 26   GLUCOSE 98 89 94  BUN 19 27* 23*  CREATININE 0.66 0.69 0.66  CALCIUM 9.0 9.2 8.9   Recent Labs    05/13/17 0700  AST 19  ALT 12*  ALKPHOS 56  BILITOT 0.5  PROT 6.8  ALBUMIN 3.6   Recent Labs    05/13/17 0700 08/26/17 0630 11/17/17 0800  WBC 5.1 4.5 4.6  NEUTROABS  --  2.4 2.0  HGB 12.9 10.5* 11.4*  HCT 40.4 33.6* 36.5  MCV 98.3 98.2 98.1  PLT 173 228 163   Lab Results  Component Value Date   TSH 4.101 08/26/2017   No results found for: HGBA1C No results found for: CHOL, HDL, LDLCALC, LDLDIRECT, TRIG, CHOLHDL  Significant Diagnostic Results in last 30 days:  No results found.  Assessment/Plan   #1 history of suspected ear infection right ear-will treat with Ocuflox right ear- will treat with Ocuflox 2 drops every 6 hours x5 days-will treat left ear wax with Debrox x3 days and flushed with warm water on day 4   #2- history  of bilateral hip fractures again no surgical intervention per family wishes- pain appears to be controlled with Duragesic patch and as needed Vicodin  #3- history of hypertension this appears stable as noted above not on any medication  #4-  osteoporosis continues on calcium with vitamin D   #5 history of hypothyroidism TSH was within normal limits in January we will update this   #6 history of anemia appears stable hemoglobin was 11.4 in April will update this.  7.-  History of depression-continues on Celexa nursing is not really reported any changes from baseline or behavioral changes  #8- 3 of dementia again this continues to be quite severe but she does well with supportive care she has lost a couple pounds in the past couple months-she is on supplements- will monitor it will check an albumin level as well  She continues on Namenda  [phone]

## 2018-02-09 ENCOUNTER — Encounter (HOSPITAL_COMMUNITY)
Admission: RE | Admit: 2018-02-09 | Discharge: 2018-02-09 | Disposition: A | Discharge status: Home / Self Care | Payer: Medicare Other | Source: Skilled Nursing Facility | Attending: Internal Medicine | Admitting: Internal Medicine

## 2018-02-09 DIAGNOSIS — F0390 Unspecified dementia without behavioral disturbance: Secondary | ICD-10-CM | POA: Diagnosis not present

## 2018-02-09 DIAGNOSIS — E039 Hypothyroidism, unspecified: Secondary | ICD-10-CM | POA: Diagnosis not present

## 2018-02-09 DIAGNOSIS — Z8781 Personal history of (healed) traumatic fracture: Secondary | ICD-10-CM | POA: Diagnosis not present

## 2018-02-09 DIAGNOSIS — M810 Age-related osteoporosis without current pathological fracture: Secondary | ICD-10-CM | POA: Insufficient documentation

## 2018-02-09 DIAGNOSIS — F411 Generalized anxiety disorder: Secondary | ICD-10-CM | POA: Diagnosis not present

## 2018-02-09 LAB — COMPREHENSIVE METABOLIC PANEL
ALBUMIN: 3.1 g/dL — AB (ref 3.5–5.0)
ALT: 13 U/L (ref 0–44)
AST: 16 U/L (ref 15–41)
Alkaline Phosphatase: 49 U/L (ref 38–126)
Anion gap: 6 (ref 5–15)
BILIRUBIN TOTAL: 0.6 mg/dL (ref 0.3–1.2)
BUN: 23 mg/dL (ref 8–23)
CO2: 28 mmol/L (ref 22–32)
CREATININE: 0.7 mg/dL (ref 0.44–1.00)
Calcium: 8.8 mg/dL — ABNORMAL LOW (ref 8.9–10.3)
Chloride: 107 mmol/L (ref 98–111)
GFR calc Af Amer: >60 mL/min (ref >60)
GFR calc non Af Amer: >60 mL/min (ref >60)
GLUCOSE: 95 mg/dL (ref 70–99)
Potassium: 4.2 mmol/L (ref 3.5–5.1)
Sodium: 141 mmol/L (ref 135–145)
TOTAL PROTEIN: 6 g/dL — AB (ref 6.5–8.1)

## 2018-02-09 LAB — CBC WITH DIFFERENTIAL/PLATELET
BASOS ABS: 0 10*3/uL (ref 0.0–0.1)
Basophils Relative: 0 %
Eosinophils Absolute: 0.1 10*3/uL (ref 0.0–0.7)
Eosinophils Relative: 2 %
HEMATOCRIT: 34.7 % — AB (ref 36.0–46.0)
HEMOGLOBIN: 11 g/dL — AB (ref 12.0–15.0)
LYMPHS ABS: 1.5 10*3/uL (ref 0.7–4.0)
LYMPHS PCT: 31 %
MCH: 31.4 pg (ref 26.0–34.0)
MCHC: 31.7 g/dL (ref 30.0–36.0)
MCV: 99.1 fL (ref 78.0–100.0)
Monocytes Absolute: 0.9 10*3/uL (ref 0.1–1.0)
Monocytes Relative: 18 %
Neutro Abs: 2.4 10*3/uL (ref 1.7–7.7)
Neutrophils Relative %: 49 %
Platelets: 182 10*3/uL (ref 150–400)
RBC: 3.5 MIL/uL — AB (ref 3.87–5.11)
RDW: 13.1 % (ref 11.5–15.5)
WBC: 4.9 10*3/uL (ref 4.0–10.5)

## 2018-02-09 LAB — TSH: TSH: 4.388 u[IU]/mL (ref 0.350–4.500)

## 2018-02-24 ENCOUNTER — Other Ambulatory Visit: Payer: Self-pay

## 2018-02-24 MED ORDER — FENTANYL 12 MCG/HR TD PT72: MEDICATED_PATCH | TRANSDERMAL | 0 refills | Status: DC

## 2018-02-24 NOTE — Telephone Encounter (Signed)
RX Fax for [facility]@ [phone]  

## 2018-03-03 DIAGNOSIS — F0391 Unspecified dementia with behavioral disturbance: Secondary | ICD-10-CM | POA: Diagnosis not present

## 2018-03-03 DIAGNOSIS — F4323 Adjustment disorder with mixed anxiety and depressed mood: Secondary | ICD-10-CM | POA: Diagnosis not present

## 2018-03-16 ENCOUNTER — Non-Acute Institutional Stay (SKILLED_NURSING_FACILITY): Payer: Medicare Other | Admitting: Internal Medicine

## 2018-03-16 ENCOUNTER — Encounter: Payer: Self-pay | Admitting: Internal Medicine

## 2018-03-16 DIAGNOSIS — M810 Age-related osteoporosis without current pathological fracture: Secondary | ICD-10-CM

## 2018-03-16 DIAGNOSIS — I10 Essential (primary) hypertension: Secondary | ICD-10-CM | POA: Diagnosis not present

## 2018-03-16 DIAGNOSIS — E039 Hypothyroidism, unspecified: Secondary | ICD-10-CM

## 2018-03-16 DIAGNOSIS — F0391 Unspecified dementia with behavioral disturbance: Secondary | ICD-10-CM | POA: Diagnosis not present

## 2018-03-16 NOTE — Progress Notes (Signed)
Location:    [facility] Room Number: 141/W Place of Service:  SNF [phone]) Provider: Veleta Miners MD  Virgie Dad, MD  Patient Care Team: Virgie Dad, MD as PCP - General (Internal Medicine)  Extended Emergency Contact Information Primary Emergency Contact: Noreene Larsson of [place] Phone: [phone] Mobile Phone: [phone] Relation: Niece Secondary Emergency Contact: Harvie Bridge States of [place] Phone: [phone] Mobile Phone: [phone] Relation: Sister  Code Status:  DNR Goals of care: Advanced Directive information Advanced Directives 03/16/2018  Does Patient Have a Medical Advance Directive? Yes  Type of Advance Directive Out of facility DNR (pink MOST or yellow form)  Does patient want to make changes to medical advance directive? No - Patient declined  Copy of [place] in Chart? No - copy requested  Would patient like information on creating a medical advance directive? No - Patient declined  Pre-existing out of facility DNR order (yellow form or pink MOST form) -     Chief Complaint  Patient presents with  . Medical Management of Chronic Issues    Patient is being sen for routine visit of medical management     HPI:  Pt is a 84 y.o. female seen today for medical management of chronic diseases.    Patient has h/o Advanced dementia and Osteoporosis . She Had Sustained Right hip fracture which was followed by Left Hip fracture due to Fall in 07/16. Family decided not to get surgical repair. She has been Pain management in Facility.  Patient is doing well in the facility and has been stable.  She was recently treated for ear infection when some discharge was noticed from her ear by her caregiver. Discussed with nurses and she has no new nursing issues.  Her pain is controlled and her appetite is good.  Her weight is 128 pounds and stable. Patient unable to give any history due to  her dementia  Past Medical History:  Diagnosis Date  . Dementia   . Osteoarthritis    Past Surgical History:  Procedure Laterality Date  . BREAST SURGERY    . EYE SURGERY    . HIP ARTHROPLASTY Right 01/01/2014   Procedure: BIPOLAR ARTHROPLASTY RIGHT HIP;  Surgeon: Carole Civil, MD;  Location: [facility];  Service: Orthopedics;  Laterality: Right;    Allergies  Allergen Reactions  . Morphine And Related     Increased confusion    Outpatient Encounter Medications as of 03/16/2018  Medication Sig  . acetaminophen (TYLENOL) 325 MG suppository Place 325 mg rectally every 4 (four) hours as needed.  Marland Kitchen acetaminophen (TYLENOL) 500 MG tablet Take  two tablets by mouth every 6 hours as needed for mild to moderate pain. DNE 3000mg  Tylenol in 24 hours from all sources.  Marland Kitchen acetaminophen (TYLENOL) 500 MG tablet Take 500 mg by mouth every 6 (six) hours as needed.  . calcium-vitamin D (OSCAL WITH D) 500-200 MG-UNIT tablet Take 1 tablet by mouth 2 (two) times daily.  . citalopram (CELEXA) 10 MG tablet Take 20 mg by mouth daily.   Marland Kitchen Dextromethorphan-Guaifenesin 5-100 MG/5ML LIQD Give 15 ml by mouth every 6 hours prn  . fentaNYL (DURAGESIC - DOSED MCG/HR) 12 MCG/HR Apply one patch topically as directed every 72 hours (every 3 days) note site. Remove old patch before applying new patch  . HYDROcodone-acetaminophen (HYCET) 7.5-325 mg/15 ml solution Give 75ml by mouth every 6 hours as needed for pain. Max APAP 3gm/24 hours  from all sources  . levothyroxine (SYNTHROID, LEVOTHROID) 25 MCG tablet Take 37.5 mcg by mouth daily before breakfast.   . loratadine (CLARITIN) 10 MG tablet Take 10 mg by mouth daily.  . memantine (NAMENDA) 5 MG tablet Take 5 mg by mouth daily.  . NON FORMULARY Magic cup give once a  day  . Nutritional Supplements (ENSURE ENLIVE PO) One by mouth daily  . senna-docusate (SENOKOT-S) 8.6-50 MG per tablet Take 1 tablet by mouth at bedtime.   No facility-administered encounter  medications on file as of 03/16/2018.      Review of Systems  Unable to perform ROS: Dementia    Immunization History  Administered Date(s) Administered  . Influenza-Unspecified 05/10/2014, 05/02/2016, 05/08/2017  . Pneumococcal Polysaccharide-23 01/02/2014  . Tdap 01/01/2014, 04/16/2017   Pertinent  Health Maintenance Due  Topic Date Due  . INFLUENZA VACCINE  04/16/2018 (Originally 03/04/2018)  . DEXA SCAN  Completed  . PNA vac Low Risk Adult  Completed   Fall Risk  04/23/2017  Falls in the past year? No   Functional Status Survey:    Vitals:   03/16/18 0842  BP: 118/69  Pulse: 79  Resp: 18  Temp: (!) 91 F (32.8 C)  TempSrc: Oral  SpO2: 95%  Weight: 127 lb 14.4 oz (58 kg)  Height: 5\' 3"  (1.6 m)   Body mass index is 22.66 kg/m. Physical Exam  Constitutional: She appears well-developed and well-nourished.  HENT:  Head: Normocephalic.  Mouth/Throat: Oropharynx is clear and moist.  Eyes: Pupils are equal, round, and reactive to light.  Neck: Neck supple.  Cardiovascular: Normal rate and regular rhythm.  Pulmonary/Chest: Effort normal and breath sounds normal.  Abdominal: Soft. Bowel sounds are normal.  Musculoskeletal: She exhibits no edema.  Has Mycotic Nails with ? Dead tissue in Right Big Toe  Neurological: She is alert.  Doesn't Follow Any commands  Skin: Skin is warm and dry.  Psychiatric: She has a normal mood and affect. Her behavior is normal.    Labs reviewed: Recent Labs    10/05/17 0830 11/17/17 0800 02/09/18 0721  NA 141 139 141  K 3.9 4.0 4.2  CL 105 104 107  CO2 27 26 28   GLUCOSE 89 94 95  BUN 27* 23* 23  CREATININE 0.69 0.66 0.70  CALCIUM 9.2 8.9 8.8*   Recent Labs    05/13/17 0700 02/09/18 0721  AST 19 16  ALT 12* 13  ALKPHOS 56 49  BILITOT 0.5 0.6  PROT 6.8 6.0*  ALBUMIN 3.6 3.1*   Recent Labs    08/26/17 0630 11/17/17 0800 02/09/18 0721  WBC 4.5 4.6 4.9  NEUTROABS 2.4 2.0 2.4  HGB 10.5* 11.4* 11.0*  HCT 33.6* 36.5  34.7*  MCV 98.2 98.1 99.1  PLT 228 163 182   Lab Results  Component Value Date   TSH 4.388 02/09/2018   No results found for: HGBA1C No results found for: CHOL, HDL, LDLCALC, LDLDIRECT, TRIG, CHOLHDL  Significant Diagnostic Results in last 30 days:  No results found.  Assessment/Plan Hypothyroidism TSH was WNL in 07/19 Continue same dose of Synthroid  Dementia with behavioral disturbances On Namenda and Celexa Is followed by in patient Psychiatry  Essential hypertension Not on any meds  Senile osteoporosis Comfort care pain control with fentanyl Anemia She just had repeat labs in the all look stable Mycotic toenails Is followed by podiatry Also discussed with the nurse to keep a close eye for any superimposed infection especially her right big toe  Family/ staff Communication:   Labs/tests ordered:    Total time spent in this patient care encounter was 25_ minutes; greater than 50% of the visit spent counseling patient, reviewing records , Labs and coordinating care for problems addressed at this encounter.

## 2018-04-15 ENCOUNTER — Encounter: Payer: Self-pay | Admitting: Internal Medicine

## 2018-04-15 ENCOUNTER — Non-Acute Institutional Stay (SKILLED_NURSING_FACILITY): Payer: Medicare Other | Admitting: Internal Medicine

## 2018-04-15 DIAGNOSIS — M810 Age-related osteoporosis without current pathological fracture: Secondary | ICD-10-CM

## 2018-04-15 DIAGNOSIS — E039 Hypothyroidism, unspecified: Secondary | ICD-10-CM

## 2018-04-15 DIAGNOSIS — S72002A Fracture of unspecified part of neck of left femur, initial encounter for closed fracture: Secondary | ICD-10-CM

## 2018-04-15 DIAGNOSIS — F0391 Unspecified dementia with behavioral disturbance: Secondary | ICD-10-CM

## 2018-04-15 DIAGNOSIS — I10 Essential (primary) hypertension: Secondary | ICD-10-CM

## 2018-04-15 NOTE — Progress Notes (Signed)
This is a routine visit.  Level care skilled.  Facility is [facility].  Chief complaint routine visit for medical management of chronic medical conditions including history of bilateral hip fractures-with osteoporosis- dementia- hypothyroidism-hypertension as well as anemia.  History of present illness.  Patient is a 84 year old female with the above diagnosis- she does have advanced dementia and continues on Namenda.  She also has a history of a right hip fracture which was followed by left hip fracture secondary to a fall.  Family has opted for no surgery with emphasis on pain control-she is on a Duragesic patch and also has Vicodin as needed and appears to have tolerated this well.  She also continues on calcium with a history of osteoporosis  Her appetite it appears to be fairly good her weight is stable at 127.3 pounds.  Nursing staff has not reported any recent acute issues-she does have a sitter quite frequently as well.  Past Medical History:  Diagnosis Date  . Dementia   . Osteoarthritis         Past Surgical History:  Procedure Laterality Date  . BREAST SURGERY    . EYE SURGERY    . HIP ARTHROPLASTY Right 01/01/2014   Procedure: BIPOLAR ARTHROPLASTY RIGHT HIP;  Surgeon: Carole Civil, MD;  Location: [facility];  Service: Orthopedics;  Laterality: Right;         Allergies  Allergen Reactions  . Morphine And Related     Increased confusion      MEDICATIONS      Medication Sig  . acetaminophen (TYLENOL) 325 MG suppository Place 325 mg rectally every 4 (four) hours as needed.  Marland Kitchen acetaminophen (TYLENOL) 500 MG tablet Take  two tablets by mouth every 6 hours as needed for mild to moderate pain. DNE 3000mg  Tylenol in 24 hours from all sources.  Marland Kitchen acetaminophen (TYLENOL) 500 MG tablet Take 500 mg by mouth every 6 (six) hours as needed.  . calcium-vitamin D (OSCAL WITH D) 500-200 MG-UNIT tablet Take 1 tablet by mouth 2 (two) times daily.  .  citalopram (CELEXA) 10 MG tablet Take 20 mg by mouth daily.   Marland Kitchen Dextromethorphan-Guaifenesin 5-100 MG/5ML LIQD Give 15 ml by mouth every 6 hours prn  . fentaNYL (DURAGESIC - DOSED MCG/HR) 12 MCG/HR Apply one patch topically as directed every 72 hours (every 3 days) note site. Remove old patch before applying new patch  . HYDROcodone-acetaminophen (HYCET) 7.5-325 mg/15 ml solution Give 57ml by mouth every 6 hours as needed for pain. Max APAP 3gm/24 hours from all sources  . levothyroxine (SYNTHROID, LEVOTHROID) 25 MCG tablet Take 37.5 mcg by mouth daily before breakfast.   . loratadine (CLARITIN) 10 MG tablet Take 10 mg by mouth daily.  . memantine (NAMENDA) 5 MG tablet Take 5 mg by mouth daily.  . NON FORMULARY Magic cup give once a  day  . Nutritional Supplements (ENSURE ENLIVE PO) One by mouth daily  . senna-docusate (SENOKOT-S) 8.6-50 MG per tablet Take 1 tablet by mouth at bedtime.   No facility-administered encounter medications on file as of 03/16/2018.    Review of systems-this is unobtainable secondary to dementia.  Physical exam. She is afebrile pulse is 68-respirations of 16-blood pressure is 150/80 manually.  In general this is a fairly well-developed elderly female in no distress she is bright and alert actually talking a small bit this evening.  Her skin is warm and dry.  Eyes sclera and conjunctive are clear visual acuity appears to be  intact she does make eye contact.  Oropharynx is clear mucous membranes moist.  Chest is clear to auscultation with poor respiratory effort there is no labored breathing.  Heart is regular rate and rhythm without murmur gallop or rub she does not appear to have significant lower extremity edema.  Abdomen is soft nontender with positive bowel sounds.  Musculoskeletal does have baseline lower extremity weakness and stiffness- has  feet in protective boots bilaterally she does not have significant edema  Neurologic she is alert cranial  nerves appear to be intact she does smile and make eye contact is speaking a small bit in fact when I left she said goodbye.  Psych findings consistent with advanced dementia she is not agitated with exam this evening  Labs.  February 09, 2018.  Sodium 141 potassium 4.2 BUN 23 creatinine 0.7.  Liver function test within normal limits except albumin of 3.1.  WBC 4.9 hemoglobin 11.0 platelets 182.  TSH is 4.388.  .  Assessment and plan.  1.  Dementia with history of depression-this appears to be relatively stable her weight is stable appetite appears to be pretty good.  She does continue on Namenda she is also on Celexa for coexistent depression continue supportive care.  2.  History of osteoporosis with bilateral hip fractures-again emphasis is on pain management- she also continues on calcium.  Currently on Duragesic patch which she has tolerated well now for an extended period time she also has Vicodin as needed for breakthrough pain.  3.  History of hypothyroidism this is stable as well and Synthroid TSH was within normal range in July.  4.  History of anemia and this is been stable with a hemoglobin of 11 on lab done in July will monitor this periodically  #5 history of allergic rhinitis she continues on routine Claritin     #6 history of hypertension currently on no medications systolic is mildly elevated today at this point will monitor she does not appear to have consistent elevations     [id number]

## 2018-04-21 ENCOUNTER — Other Ambulatory Visit: Payer: Self-pay

## 2018-04-21 MED ORDER — FENTANYL 12 MCG/HR TD PT72: MEDICATED_PATCH | TRANSDERMAL | 0 refills | Status: DC

## 2018-04-21 NOTE — Telephone Encounter (Signed)
RX Fax for [facility]@ [phone]  

## 2018-04-26 DIAGNOSIS — B351 Tinea unguium: Secondary | ICD-10-CM | POA: Diagnosis not present

## 2018-04-26 DIAGNOSIS — I739 Peripheral vascular disease, unspecified: Secondary | ICD-10-CM | POA: Diagnosis not present

## 2018-05-01 DIAGNOSIS — F4323 Adjustment disorder with mixed anxiety and depressed mood: Secondary | ICD-10-CM | POA: Diagnosis not present

## 2018-05-01 DIAGNOSIS — F0391 Unspecified dementia with behavioral disturbance: Secondary | ICD-10-CM | POA: Diagnosis not present

## 2018-05-03 ENCOUNTER — Non-Acute Institutional Stay (SKILLED_NURSING_FACILITY): Payer: Medicare Other

## 2018-05-03 DIAGNOSIS — Z0000 Encounter for general adult medical examination without abnormal findings: Secondary | ICD-10-CM

## 2018-05-03 NOTE — Progress Notes (Signed)
Subjective:   [name] is a 84 y.o. female who presents for Medicare Annual (Subsequent) preventive examination at [facility]  Last AWV-04/23/2017    Objective:     Vitals: BP 133/69 (BP Location: Left Arm, Patient Position: Supine)   Pulse 68   Temp 98 F (36.7 C) (Oral)   Ht 5\' 3"  (1.6 m)   Wt 127 lb (57.6 kg)   BMI 22.50 kg/m   Body mass index is 22.5 kg/m.  Advanced Directives 05/03/2018 03/16/2018 02/08/2018 12/02/2017 11/09/2017 10/16/2017 08/25/2017  Does Patient Have a Medical Advance Directive? Yes Yes Yes Yes Yes Yes Yes  Type of Advance Directive Out of facility DNR (pink MOST or yellow form) Out of facility DNR (pink MOST or yellow form) Out of facility DNR (pink MOST or yellow form) Out of facility DNR (pink MOST or yellow form) Out of facility DNR (pink MOST or yellow form) Out of facility DNR (pink MOST or yellow form) Out of facility DNR (pink MOST or yellow form)  Does patient want to make changes to medical advance directive? No - Patient declined No - Patient declined No - Patient declined No - Patient declined No - Patient declined No - Patient declined No - Patient declined  Copy of [place] in Chart? No - copy requested No - copy requested No - copy requested No - copy requested No - copy requested No - copy requested No - copy requested  Would patient like information on creating a medical advance directive? No - Patient declined No - Patient declined No - Patient declined No - Patient declined No - Patient declined No - Patient declined No - Patient declined  Pre-existing out of facility DNR order (yellow form or pink MOST form) Yellow form placed in chart (order not valid for inpatient use);Pink MOST form placed in chart (order not valid for inpatient use) - - - - - -    Tobacco Social History   Tobacco Use  Smoking Status Never Smoker  Smokeless Tobacco Never Used     Counseling given: Not Answered   Clinical  Intake:  Pre-visit preparation completed: No  Pain : No/denies pain     Diabetes: No  How often do you need to have someone help you when you read instructions, pamphlets, or other written materials from your doctor or pharmacy?: 4 - Often  Interpreter Needed?: No  Information entered by :: Tyson Dense, RN  Past Medical History:  Diagnosis Date  . Dementia   . Osteoarthritis    Past Surgical History:  Procedure Laterality Date  . BREAST SURGERY    . EYE SURGERY    . HIP ARTHROPLASTY Right 01/01/2014   Procedure: BIPOLAR ARTHROPLASTY RIGHT HIP;  Surgeon: Carole Civil, MD;  Location: [facility];  Service: Orthopedics;  Laterality: Right;   History reviewed. No pertinent family history. Social History   Socioeconomic History  . Marital status: Widowed    Spouse name: Not on file  . Number of children: 0  . Years of education: 7th  . Highest education level: Not on file  Occupational History    Employer: RETIRED  Social Needs  . Financial resource strain: Not hard at all  . Food insecurity:    Worry: Never true    Inability: Never true  . Transportation needs:    Medical: No    Non-medical: No  Tobacco Use  . Smoking status: Never Smoker  . Smokeless tobacco:  Never Used  Substance and Sexual Activity  . Alcohol use: No  . Drug use: No  . Sexual activity: Never  Lifestyle  . Physical activity:    Days per week: 0 days    Minutes per session: 0 min  . Stress: Not at all  Relationships  . Social connections:    Talks on phone: Never    Gets together: Once a week    Attends religious service: Never    Active member of club or organization: No    Attends meetings of clubs or organizations: Never    Relationship status: Widowed  Other Topics Concern  . Not on file  Social History Narrative  . Not on file    Outpatient Encounter Medications as of 05/03/2018  Medication Sig  . acetaminophen (TYLENOL) 325 MG suppository Place 325 mg rectally every 4  (four) hours as needed.  Marland Kitchen acetaminophen (TYLENOL) 500 MG tablet Take  two tablets by mouth every 6 hours as needed for mild to moderate pain. DNE 3000mg  Tylenol in 24 hours from all sources.  Marland Kitchen acetaminophen (TYLENOL) 500 MG tablet Take 500 mg by mouth every 6 (six) hours as needed.  . calcium-vitamin D (OSCAL WITH D) 500-200 MG-UNIT tablet Take 1 tablet by mouth 2 (two) times daily.  . citalopram (CELEXA) 10 MG tablet Take 20 mg by mouth daily.   Marland Kitchen Dextromethorphan-Guaifenesin 5-100 MG/5ML LIQD Give 15 ml by mouth every 6 hours prn  . fentaNYL (DURAGESIC - DOSED MCG/HR) 12 MCG/HR Apply one patch topically as directed every 72 hours (every 3 days) note site. Remove old patch before applying new patch  . HYDROcodone-acetaminophen (HYCET) 7.5-325 mg/15 ml solution Give 43ml by mouth every 6 hours as needed for pain. Max APAP 3gm/24 hours from all sources  . levothyroxine (SYNTHROID, LEVOTHROID) 25 MCG tablet Take 37.5 mcg by mouth daily before breakfast.   . loratadine (CLARITIN) 10 MG tablet Take 10 mg by mouth daily.  . memantine (NAMENDA) 5 MG tablet Take 5 mg by mouth daily.  . NON FORMULARY Magic cup give once a  day  . Nutritional Supplements (ENSURE ENLIVE PO) One by mouth daily  . senna-docusate (SENOKOT-S) 8.6-50 MG per tablet Take 1 tablet by mouth at bedtime.   No facility-administered encounter medications on file as of 05/03/2018.     Activities of Daily Living In your present state of health, do you have any difficulty performing the following activities: 05/03/2018  Hearing? N  Vision? N  Difficulty concentrating or making decisions? Y  Walking or climbing stairs? Y  Dressing or bathing? Y  Doing errands, shopping? Y  Preparing Food and eating ? Y  Using the Toilet? Y  In the past six months, have you accidently leaked urine? Y  Do you have problems with loss of bowel control? Y  Managing your Medications? Y  Managing your Finances? Y  Housekeeping or managing your  Housekeeping? Y  Some recent data might be hidden    Patient Care Team: Virgie Dad, MD as PCP - General (Internal Medicine)    Assessment:   This is a routine wellness examination for [name].  Exercise Activities and Dietary recommendations Current Exercise Habits: The patient does not participate in regular exercise at present, Exercise limited by: neurologic condition(s);orthopedic condition(s)  Goals   None     Fall Risk Fall Risk  05/03/2018 04/23/2017  Falls in the past year? No No   Is the patient's home free of loose throw rugs  in walkways, pet beds, electrical cords, etc?   yes      Grab bars in the bathroom? yes      Handrails on the stairs?   yes      Adequate lighting?   yes  Depression Screen PHQ 2/9 Scores 05/03/2018 04/23/2017  PHQ - 2 Score 0 0     Cognitive Function MMSE - Mini Mental State Exam 05/03/2018 04/23/2017 09/28/2014  Not completed: Unable to complete Unable to complete -  Orientation to time - - 0  Orientation to Place - - 1  Registration - - 0  Attention/ Calculation - - 0  Recall - - 0  Language- name 2 objects - - 2  Language- repeat - - 0  Language- follow 3 step command - - 0  Language- read & follow direction - - 0  Write a sentence - - 0  Copy design - - 0  Total score - - 3        Immunization History  Administered Date(s) Administered  . Influenza-Unspecified 05/10/2014, 05/02/2016, 05/08/2017  . Pneumococcal Polysaccharide-23 01/02/2014  . Tdap 01/01/2014, 04/16/2017    Qualifies for Shingles Vaccine? Not in past records  Screening Tests Health Maintenance  Topic Date Due  . INFLUENZA VACCINE  03/04/2018  . TETANUS/TDAP  04/17/2027  . DEXA SCAN  Completed  . PNA vac Low Risk Adult  Completed    Cancer Screenings: Lung: Low Dose CT Chest recommended if Age 89-80 years, 30 pack-year currently smoking OR have quit w/in 15years. Patient does not qualify. Breast:  Up to date on Mammogram? Yes   Up to date of Bone  Density/Dexa? Yes Colorectal: up to date  Additional Screenings:  Hepatitis C Screening: unable to appropriately accept or decline Flu vaccine due: will receive at [facility]     Plan:    I have personally reviewed and addressed the Medicare Annual Wellness questionnaire and have noted the following in the patient's chart:  A. Medical and social history B. Use of alcohol, tobacco or illicit drugs  C. Current medications and supplements D. Functional ability and status E.  Nutritional status F.  Physical activity G. Advance directives H. List of other physicians I.  Hospitalizations, surgeries, and ER visits in previous 12 months J.  [place] to include hearing, vision, cognitive, depression L. Referrals and appointments - none  In addition, I have reviewed and discussed with patient certain preventive protocols, quality metrics, and best practice recommendations. A written personalized care plan for preventive services as well as general preventive health recommendations were provided to patient.  See attached scanned questionnaire for additional information.   Signed,   Tyson Dense, RN Nurse Health Advisor  Patient Concerns: None

## 2018-05-03 NOTE — Patient Instructions (Signed)
[name] , Thank you for taking time to come for your Medicare Wellness Visit. I appreciate your ongoing commitment to your health goals. Please review the following plan we discussed and let me know if I can assist you in the future.   Screening recommendations/referrals: Colonoscopy excluded, over age 82 Mammogram excluded, over age 22 Bone Density up to date Recommended yearly ophthalmology/optometry visit for glaucoma screening and checkup Recommended yearly dental visit for hygiene and checkup  Vaccinations: Influenza vaccine due, will receive at [facility] Pneumococcal vaccine up to date, completed Tdap vaccine up to date, due 04/17/2027 Shingles vaccine not in pas records    Advanced directives: in chart  Conditions/risks identified: none  Next appointment: Dr. Lyndel Safe makes rounds   Preventive Care 65 Years and Older, Female Preventive care refers to lifestyle choices and visits with your health care provider that can promote health and wellness. What does preventive care include?  A yearly physical exam. This is also called an annual well check.  Dental exams once or twice a year.  Routine eye exams. Ask your health care provider how often you should have your eyes checked.  Personal lifestyle choices, including:  Daily care of your teeth and gums.  Regular physical activity.  Eating a healthy diet.  Avoiding tobacco and drug use.  Limiting alcohol use.  Practicing safe sex.  Taking low-dose aspirin every day.  Taking vitamin and mineral supplements as recommended by your health care provider. What happens during an annual well check? The services and screenings done by your health care provider during your annual well check will depend on your age, overall health, lifestyle risk factors, and family history of disease. Counseling  Your health care provider may ask you questions about your:  Alcohol use.  Tobacco use.  Drug use.  Emotional  well-being.  Home and relationship well-being.  Sexual activity.  Eating habits.  History of falls.  Memory and ability to understand (cognition).  Work and work Statistician.  Reproductive health. Screening  You may have the following tests or measurements:  Height, weight, and BMI.  Blood pressure.  Lipid and cholesterol levels. These may be checked every 5 years, or more frequently if you are over 76 years old.  Skin check.  Lung cancer screening. You may have this screening every year starting at age 71 if you have a 30-pack-year history of smoking and currently smoke or have quit within the past 15 years.  Fecal occult blood test (FOBT) of the stool. You may have this test every year starting at age 77.  Flexible sigmoidoscopy or colonoscopy. You may have a sigmoidoscopy every 5 years or a colonoscopy every 10 years starting at age 59.  Hepatitis C blood test.  Hepatitis B blood test.  Sexually transmitted disease (STD) testing.  Diabetes screening. This is done by checking your blood sugar (glucose) after you have not eaten for a while (fasting). You may have this done every 1-3 years.  Bone density scan. This is done to screen for osteoporosis. You may have this done starting at age 61.  Mammogram. This may be done every 1-2 years. Talk to your health care provider about how often you should have regular mammograms. Talk with your health care provider about your test results, treatment options, and if necessary, the need for more tests. Vaccines  Your health care provider may recommend certain vaccines, such as:  Influenza vaccine. This is recommended every year.  Tetanus, diphtheria, and acellular pertussis (Tdap, Td) vaccine.  You may need a Td booster every 10 years.  Zoster vaccine. You may need this after age 22.  Pneumococcal 13-valent conjugate (PCV13) vaccine. One dose is recommended after age 76.  Pneumococcal polysaccharide (PPSV23) vaccine. One  dose is recommended after age 69. Talk to your health care provider about which screenings and vaccines you need and how often you need them. This information is not intended to replace advice given to you by your health care provider. Make sure you discuss any questions you have with your health care provider. Document Released: 08/17/2015 Document Revised: 04/09/2016 Document Reviewed: 05/22/2015 Elsevier Interactive Patient Education  2017 [place] Prevention in the Home Falls can cause injuries. They can happen to people of all ages. There are many things you can do to make your home safe and to help prevent falls. What can I do on the outside of my home?  Regularly fix the edges of walkways and driveways and fix any cracks.  Remove anything that might make you trip as you walk through a door, such as a raised step or threshold.  Trim any bushes or trees on the path to your home.  Use bright outdoor lighting.  Clear any walking paths of anything that might make someone trip, such as rocks or tools.  Regularly check to see if handrails are loose or broken. Make sure that both sides of any steps have handrails.  Any raised decks and porches should have guardrails on the edges.  Have any leaves, snow, or ice cleared regularly.  Use sand or salt on walking paths during winter.  Clean up any spills in your garage right away. This includes oil or grease spills. What can I do in the bathroom?  Use night lights.  Install grab bars by the toilet and in the tub and shower. Do not use towel bars as grab bars.  Use non-skid mats or decals in the tub or shower.  If you need to sit down in the shower, use a plastic, non-slip stool.  Keep the floor dry. Clean up any water that spills on the floor as soon as it happens.  Remove soap buildup in the tub or shower regularly.  Attach bath mats securely with double-sided non-slip rug tape.  Do not have throw rugs and other  things on the floor that can make you trip. What can I do in the bedroom?  Use night lights.  Make sure that you have a light by your bed that is easy to reach.  Do not use any sheets or blankets that are too big for your bed. They should not hang down onto the floor.  Have a firm chair that has side arms. You can use this for support while you get dressed.  Do not have throw rugs and other things on the floor that can make you trip. What can I do in the kitchen?  Clean up any spills right away.  Avoid walking on wet floors.  Keep items that you use a lot in easy-to-reach places.  If you need to reach something above you, use a strong step stool that has a grab bar.  Keep electrical cords out of the way.  Do not use floor polish or wax that makes floors slippery. If you must use wax, use non-skid floor wax.  Do not have throw rugs and other things on the floor that can make you trip. What can I do with my stairs?  Do not leave any  items on the stairs.  Make sure that there are handrails on both sides of the stairs and use them. Fix handrails that are broken or loose. Make sure that handrails are as long as the stairways.  Check any carpeting to make sure that it is firmly attached to the stairs. Fix any carpet that is loose or worn.  Avoid having throw rugs at the top or bottom of the stairs. If you do have throw rugs, attach them to the floor with carpet tape.  Make sure that you have a light switch at the top of the stairs and the bottom of the stairs. If you do not have them, ask someone to add them for you. What else can I do to help prevent falls?  Wear shoes that:  Do not have high heels.  Have rubber bottoms.  Are comfortable and fit you well.  Are closed at the toe. Do not wear sandals.  If you use a stepladder:  Make sure that it is fully opened. Do not climb a closed stepladder.  Make sure that both sides of the stepladder are locked into place.  Ask  someone to hold it for you, if possible.  Clearly mark and make sure that you can see:  Any grab bars or handrails.  First and last steps.  Where the edge of each step is.  Use tools that help you move around (mobility aids) if they are needed. These include:  Canes.  Walkers.  Scooters.  Crutches.  Turn on the lights when you go into a dark area. Replace any light bulbs as soon as they burn out.  Set up your furniture so you have a clear path. Avoid moving your furniture around.  If any of your floors are uneven, fix them.  If there are any pets around you, be aware of where they are.  Review your medicines with your doctor. Some medicines can make you feel dizzy. This can increase your chance of falling. Ask your doctor what other things that you can do to help prevent falls. This information is not intended to replace advice given to you by your health care provider. Make sure you discuss any questions you have with your health care provider. Document Released: 05/17/2009 Document Revised: 12/27/2015 Document Reviewed: 08/25/2014 Elsevier Interactive Patient Education  2017 [employer].

## 2018-05-18 ENCOUNTER — Encounter: Payer: Self-pay | Admitting: Internal Medicine

## 2018-05-18 ENCOUNTER — Non-Acute Institutional Stay (SKILLED_NURSING_FACILITY): Payer: Medicare Other | Admitting: Internal Medicine

## 2018-05-18 DIAGNOSIS — E039 Hypothyroidism, unspecified: Secondary | ICD-10-CM | POA: Diagnosis not present

## 2018-05-18 DIAGNOSIS — I10 Essential (primary) hypertension: Secondary | ICD-10-CM | POA: Diagnosis not present

## 2018-05-18 DIAGNOSIS — F0391 Unspecified dementia with behavioral disturbance: Secondary | ICD-10-CM

## 2018-05-18 DIAGNOSIS — S72001G Fracture of unspecified part of neck of right femur, subsequent encounter for closed fracture with delayed healing: Secondary | ICD-10-CM

## 2018-05-18 NOTE — Progress Notes (Signed)
Location:    [facility] Room Number: 141/W Place of Service:  SNF [phone]) Provider:  Granville Lewis PA-C  Virgie Dad, MD  Patient Care Team: Virgie Dad, MD as PCP - General (Internal Medicine)  Extended Emergency Contact Information Primary Emergency Contact: Noreene Larsson of [place] Phone: [phone] Mobile Phone: [phone] Relation: Niece Secondary Emergency Contact: Harvie Bridge States of [place] Phone: [phone] Mobile Phone: [phone] Relation: Sister  Code Status:  DNR Goals of care: Advanced Directive information Advanced Directives 05/18/2018  Does Patient Have a Medical Advance Directive? Yes  Type of Advance Directive Out of facility DNR (pink MOST or yellow form)  Does patient want to make changes to medical advance directive? No - Patient declined  Copy of [place] in Chart? No - copy requested  Would patient like information on creating a medical advance directive? No - Patient declined  Pre-existing out of facility DNR order (yellow form or pink MOST form) -     Chief Complaint  Patient presents with  . Medical Management of Chronic Issues    routine visit of medical management   Recommend history of chronic medical conditions which include dementia-history of bilateral hip fractures with osteoporosis as well as hypertension hypothyroidism and anemia.   HPI:  Pt is a 84 y.o. female seen today for medical management of chronic diseases.  As noted above.  She has a history of advanced dementia but has done well with supportive care she does continue on Namenda  Her weight appears to be stable and around 126 pounds.  She does have a history of bilateral hip fractures initially a right hip fracture that was followed by left hip fracture after a fall.  Family has chosen for no surgery with emphasis on pain control she is on a Duragesic patch and Vicodin as needed and  appears to be doing well with this she has been on this for an extended period of time.  She is on calcium supplementation as well.  Regards her hypothyroidism she is on Synthroid TSH was within normal limits on lab done in July.  She also has a listed history of anemia appears to be stable with a hemoglobin of 11 on lab done in July as well.     Past Medical History:  Diagnosis Date  . Dementia ([place])   . Osteoarthritis    Past Surgical History:  Procedure Laterality Date  . BREAST SURGERY    . EYE SURGERY    . HIP ARTHROPLASTY Right 01/01/2014   Procedure: BIPOLAR ARTHROPLASTY RIGHT HIP;  Surgeon: Carole Civil, MD;  Location: [facility];  Service: Orthopedics;  Laterality: Right;    Allergies  Allergen Reactions  . Morphine And Related     Increased confusion    Outpatient Encounter Medications as of 05/18/2018  Medication Sig  . acetaminophen (TYLENOL) 325 MG suppository Place 325 mg rectally every 4 (four) hours as needed.  Marland Kitchen acetaminophen (TYLENOL) 500 MG tablet Take  two tablets by mouth every 6 hours as needed for mild to moderate pain. DNE 3000mg  Tylenol in 24 hours from all sources.  Marland Kitchen acetaminophen (TYLENOL) 500 MG tablet Take 500 mg by mouth every 6 (six) hours as needed.  . calcium-vitamin D (OSCAL WITH D) 500-200 MG-UNIT tablet Take 1 tablet by mouth 2 (two) times daily.  . citalopram (CELEXA) 10 MG tablet Take 20 mg by mouth daily.   Marland Kitchen Dextromethorphan-Guaifenesin 5-100 MG/5ML LIQD  Give 15 ml by mouth every 6 hours prn  . fentaNYL (DURAGESIC - DOSED MCG/HR) 12 MCG/HR Apply one patch topically as directed every 72 hours (every 3 days) note site. Remove old patch before applying new patch  . HYDROcodone-acetaminophen (HYCET) 7.5-325 mg/15 ml solution Give 32ml by mouth every 6 hours as needed for pain. Max APAP 3gm/24 hours from all sources  . levothyroxine (SYNTHROID, LEVOTHROID) 25 MCG tablet Take 37.5 mcg by mouth daily before breakfast.   . loratadine  (CLARITIN) 10 MG tablet Take 10 mg by mouth daily.  . memantine (NAMENDA) 5 MG tablet Take 5 mg by mouth daily.  . NON FORMULARY Magic cup give once a  day  . Nutritional Supplements (ENSURE ENLIVE PO) One by mouth daily  . senna-docusate (SENOKOT-S) 8.6-50 MG per tablet Take 1 tablet by mouth at bedtime.   No facility-administered encounter medications on file as of 05/18/2018.      Review of Systems   Is unobtainable secondary to dementia please see HPI  Immunization History  Administered Date(s) Administered  . Influenza-Unspecified 05/10/2014, 05/02/2016, 05/08/2017  . Pneumococcal Polysaccharide-23 01/02/2014  . Tdap 01/01/2014, 04/16/2017   Pertinent  Health Maintenance Due  Topic Date Due  . INFLUENZA VACCINE  Completed  . DEXA SCAN  Completed  . PNA vac Low Risk Adult  Completed   Fall Risk  05/03/2018 04/23/2017  Falls in the past year? No No   Functional Status Survey:    Vitals:   05/18/18 1540  BP: (!) 143/72  Pulse: 74  Resp: 16  Temp: 98.7 F (37.1 C)  TempSrc: Oral  SpO2: 95%  Weight: 126 lb 12.8 oz (57.5 kg)  Height: 5\' 3"  (1.6 m)   Body mass index is 22.46 kg/m. Physical Exam  In general this is a fairly well-developed elderly female no distress she is lying comfortably in bed is not really talking today however  Her skin is warm and dry.  Eyes sclera and conjunctive are clear visual acuity appears intact she will make eye contact.  Oropharynx could not really be assessed since she did not follow verbal commands and opening her mouth.  Chest is clear to auscultation with poor respiratory effort-there is no labored breathing no sign of distress.  Heart is regular rate and rhythm without murmur gallop or rub -- does not have significant lower extremity edema  Abdomen is soft does not appear to be tender there are positive bowel sounds.  Musculoskeletal has general weakness- which is unchanged from baseline this is most pronounced the lower  extremities with her history of hip fractures-she does have protective boots on bilaterally with baseline generalized stiffness of her lower extremities.  Neurologic alert makes eye contact cranial nerves appear to be grossly intact- she is not speaking today- appears to be essentially at baseline from previous exams.  Psych again she does have significant advanced dementia she is not agitated with exam does not follow any verbal commands      Labs reviewed: Recent Labs    10/05/17 0830 11/17/17 0800 02/09/18 0721  NA 141 139 141  K 3.9 4.0 4.2  CL 105 104 107  CO2 27 26 28   GLUCOSE 89 94 95  BUN 27* 23* 23  CREATININE 0.69 0.66 0.70  CALCIUM 9.2 8.9 8.8*   Recent Labs    02/09/18 0721  AST 16  ALT 13  ALKPHOS 49  BILITOT 0.6  PROT 6.0*  ALBUMIN 3.1*   Recent Labs  08/26/17 0630 11/17/17 0800 02/09/18 0721  WBC 4.5 4.6 4.9  NEUTROABS 2.4 2.0 2.4  HGB 10.5* 11.4* 11.0*  HCT 33.6* 36.5 34.7*  MCV 98.2 98.1 99.1  PLT 228 163 182   Lab Results  Component Value Date   TSH 4.388 02/09/2018   No results found for: HGBA1C No results found for: CHOL, HDL, LDLCALC, LDLDIRECT, TRIG, CHOLHDL  Significant Diagnostic Results in last 30 days:  No results found.  Assessment/Plan   #1 dementia and history of depression-this has been stable with supportive care she often has a sitter at bedside weight is stable currently appetite is fairly good yet- she is on Namenda as well as Celexa.  2.-History of osteoporosis with bilateral hip fractures in the past is is is on pain management she is on calcium supplementation -- does receive a Duragesic patch for pain as well as Vicodin as needed and this appears to be doing well.  3.  History of hypothyroidism as noted above TSH has been within normal limits last done in July she is on Synthroid.  4-- history of anemia this has been stable with a hemoglobin of 11 on lab done in July will have this updated.  5.  History of  allergic rhinitis she is on routine Claritin appear to  Not  have been an  issue in some time   r 6 hypertension?-  At times appear systolics are mildly elevated but with her numerous diagnoses have been somewhat conservative with this not wanting to cause hypotension-at this point will monitor.  Will update again a CBC for updated values as well as metabolic panel.  [id number]

## 2018-05-19 ENCOUNTER — Encounter (HOSPITAL_COMMUNITY)
Admission: RE | Admit: 2018-05-19 | Discharge: 2018-05-19 | Disposition: A | Discharge status: Home / Self Care | Payer: Medicare Other | Source: Skilled Nursing Facility | Attending: Internal Medicine | Admitting: Internal Medicine

## 2018-05-19 DIAGNOSIS — Z8781 Personal history of (healed) traumatic fracture: Secondary | ICD-10-CM | POA: Insufficient documentation

## 2018-05-19 DIAGNOSIS — M1990 Unspecified osteoarthritis, unspecified site: Secondary | ICD-10-CM | POA: Insufficient documentation

## 2018-05-19 DIAGNOSIS — F0390 Unspecified dementia without behavioral disturbance: Secondary | ICD-10-CM | POA: Diagnosis not present

## 2018-05-19 DIAGNOSIS — M810 Age-related osteoporosis without current pathological fracture: Secondary | ICD-10-CM | POA: Insufficient documentation

## 2018-05-19 LAB — CBC WITH DIFFERENTIAL/PLATELET
Abs Immature Granulocytes: 0 10*3/uL (ref 0.00–0.07)
BASOS ABS: 0 10*3/uL (ref 0.0–0.1)
BASOS PCT: 1 %
EOS ABS: 0.1 10*3/uL (ref 0.0–0.5)
Eosinophils Relative: 2 %
HCT: 34.1 % — ABNORMAL LOW (ref 36.0–46.0)
Hemoglobin: 10.4 g/dL — ABNORMAL LOW (ref 12.0–15.0)
Immature Granulocytes: 0 %
Lymphocytes Relative: 33 %
Lymphs Abs: 1.4 10*3/uL (ref 0.7–4.0)
MCH: 30.8 pg (ref 26.0–34.0)
MCHC: 30.5 g/dL (ref 30.0–36.0)
MCV: 100.9 fL — ABNORMAL HIGH (ref 80.0–100.0)
Monocytes Absolute: 0.7 10*3/uL (ref 0.1–1.0)
Monocytes Relative: 16 %
Neutro Abs: 2 10*3/uL (ref 1.7–7.7)
Neutrophils Relative %: 48 %
PLATELETS: 212 10*3/uL (ref 150–400)
RBC: 3.38 MIL/uL — ABNORMAL LOW (ref 3.87–5.11)
RDW: 12.9 % (ref 11.5–15.5)
WBC: 4.1 10*3/uL (ref 4.0–10.5)
nRBC: 0 % (ref 0.0–0.2)

## 2018-05-19 LAB — BASIC METABOLIC PANEL
ANION GAP: 8 (ref 5–15)
BUN: 25 mg/dL — ABNORMAL HIGH (ref 8–23)
CO2: 23 mmol/L (ref 22–32)
Calcium: 8.6 mg/dL — ABNORMAL LOW (ref 8.9–10.3)
Chloride: 108 mmol/L (ref 98–111)
Creatinine, Ser: 0.7 mg/dL (ref 0.44–1.00)
Glucose, Bld: 102 mg/dL — ABNORMAL HIGH (ref 70–99)
Potassium: 4.3 mmol/L (ref 3.5–5.1)
Sodium: 139 mmol/L (ref 135–145)

## 2018-05-20 ENCOUNTER — Other Ambulatory Visit: Payer: Self-pay

## 2018-05-20 MED ORDER — FENTANYL 12 MCG/HR TD PT72: MEDICATED_PATCH | TRANSDERMAL | 0 refills | Status: DC

## 2018-05-20 NOTE — Telephone Encounter (Signed)
RX Fax for [facility]@ [phone]  

## 2018-06-24 ENCOUNTER — Other Ambulatory Visit: Payer: Self-pay

## 2018-06-24 MED ORDER — FENTANYL 12 MCG/HR TD PT72: MEDICATED_PATCH | TRANSDERMAL | 0 refills | Status: DC

## 2018-06-24 NOTE — Telephone Encounter (Signed)
RX Fax for [facility]@ [phone]  

## 2018-07-03 DIAGNOSIS — F0391 Unspecified dementia with behavioral disturbance: Secondary | ICD-10-CM | POA: Diagnosis not present

## 2018-07-03 DIAGNOSIS — F4323 Adjustment disorder with mixed anxiety and depressed mood: Secondary | ICD-10-CM | POA: Diagnosis not present

## 2018-07-12 ENCOUNTER — Encounter: Payer: Self-pay | Admitting: Internal Medicine

## 2018-07-12 ENCOUNTER — Non-Acute Institutional Stay (SKILLED_NURSING_FACILITY): Payer: Medicare Other | Admitting: Internal Medicine

## 2018-07-12 DIAGNOSIS — F0390 Unspecified dementia without behavioral disturbance: Secondary | ICD-10-CM

## 2018-07-12 DIAGNOSIS — F32A Depression, unspecified: Secondary | ICD-10-CM

## 2018-07-12 DIAGNOSIS — E039 Hypothyroidism, unspecified: Secondary | ICD-10-CM | POA: Diagnosis not present

## 2018-07-12 DIAGNOSIS — I10 Essential (primary) hypertension: Secondary | ICD-10-CM | POA: Diagnosis not present

## 2018-07-12 DIAGNOSIS — F329 Major depressive disorder, single episode, unspecified: Secondary | ICD-10-CM | POA: Diagnosis not present

## 2018-07-12 DIAGNOSIS — M810 Age-related osteoporosis without current pathological fracture: Secondary | ICD-10-CM | POA: Diagnosis not present

## 2018-07-12 NOTE — Progress Notes (Signed)
Location:    [facility] Room Number: 141/W Place of Service:  SNF [phone]) Provider:  Veleta Miners MD  Virgie Dad, MD  Patient Care Team: Virgie Dad, MD as PCP - General (Internal Medicine)  Extended Emergency Contact Information Primary Emergency Contact: Noreene Larsson of [place] Phone: [phone] Mobile Phone: [phone] Relation: Niece Secondary Emergency Contact: Harvie Bridge States of [place] Phone: [phone] Mobile Phone: [phone] Relation: Sister  Code Status:  DNR Goals of care: Advanced Directive information Advanced Directives 07/12/2018  Does Patient Have a Medical Advance Directive? Yes  Type of Advance Directive Out of facility DNR (pink MOST or yellow form)  Does patient want to make changes to medical advance directive? No - Patient declined  Copy of [place] in Chart? No - copy requested  Would patient like information on creating a medical advance directive? No - Patient declined  Pre-existing out of facility DNR order (yellow form or pink MOST form) -     Chief Complaint  Patient presents with  . Medical Management of Chronic Issues    Routine visit of medical management    HPI:  Pt is a 84 y.o. female seen today for medical management of chronic diseases.    Patient has h/o Advanced dementia and Osteoporosis . She Had Sustained Right hip fracture which was followed by Left Hip fracture due to Fall in 07/16. Family decided not to get surgical repair. She has been Pain management in Facility.  Patient is doing well in the facility and has been stable Her weight is slightly down ar 124 lbs Per Nurses no new Nursing issues. She cannot give any history due to Dementia  Past Medical History:  Diagnosis Date  . Dementia ([place])   . Osteoarthritis    Past Surgical History:  Procedure Laterality Date  . BREAST SURGERY    . EYE SURGERY    . HIP ARTHROPLASTY  Right 01/01/2014   Procedure: BIPOLAR ARTHROPLASTY RIGHT HIP;  Surgeon: Carole Civil, MD;  Location: [facility];  Service: Orthopedics;  Laterality: Right;    Allergies  Allergen Reactions  . Morphine And Related     Increased confusion    Outpatient Encounter Medications as of 07/12/2018  Medication Sig  . acetaminophen (TYLENOL) 325 MG suppository Place 325 mg rectally every 4 (four) hours as needed.  Marland Kitchen acetaminophen (TYLENOL) 500 MG tablet Take  two tablets by mouth every 6 hours as needed for mild to moderate pain. DNE 3000mg  Tylenol in 24 hours from all sources.  Marland Kitchen acetaminophen (TYLENOL) 500 MG tablet Take 500 mg by mouth every 6 (six) hours as needed.  . calcium-vitamin D (OSCAL WITH D) 500-200 MG-UNIT tablet Take 1 tablet by mouth 2 (two) times daily.  . citalopram (CELEXA) 10 MG tablet Take 20 mg by mouth daily.   Marland Kitchen Dextromethorphan-Guaifenesin 5-100 MG/5ML LIQD Give 15 ml by mouth every 6 hours prn  . fentaNYL (DURAGESIC - DOSED MCG/HR) 12 MCG/HR Apply one patch topically as directed every 72 hours (every 3 days) note site. Remove old patch before applying new patch  . HYDROcodone-acetaminophen (HYCET) 7.5-325 mg/15 ml solution Give 27ml by mouth every 6 hours as needed for pain. Max APAP 3gm/24 hours from all sources  . levothyroxine (SYNTHROID, LEVOTHROID) 25 MCG tablet Take 37.5 mcg by mouth daily before breakfast.   . loratadine (CLARITIN) 10 MG tablet Take 10 mg by mouth daily.  . memantine (NAMENDA) 5 MG  tablet Take 5 mg by mouth daily.  . NON FORMULARY Magic cup give once a  day  . Nutritional Supplements (ENSURE ENLIVE PO) One by mouth daily  . senna-docusate (SENOKOT-S) 8.6-50 MG per tablet Take 1 tablet by mouth at bedtime.   No facility-administered encounter medications on file as of 07/12/2018.     Review of Systems  Unable to perform ROS: Dementia    Immunization History  Administered Date(s) Administered  . Influenza-Unspecified 05/10/2014, 05/02/2016,  05/08/2017  . Pneumococcal Polysaccharide-23 01/02/2014  . Tdap 01/01/2014, 04/16/2017   Pertinent  Health Maintenance Due  Topic Date Due  . INFLUENZA VACCINE  Completed  . DEXA SCAN  Completed  . PNA vac Low Risk Adult  Completed   Fall Risk  05/03/2018 04/23/2017  Falls in the past year? No No   Functional Status Survey:    Vitals:   07/12/18 1222  BP: 108/65  Pulse: (!) 50  Resp: 16  Temp: 97.8 F (36.6 C)  TempSrc: Oral  SpO2: 95%  Weight: 124 lb 8 oz (56.5 kg)  Height: 5\' 3"  (1.6 m)   Body mass index is 22.05 kg/m. Physical Exam  Constitutional: She appears well-developed and well-nourished.  HENT:  Head: Normocephalic.  Mouth/Throat: Oropharynx is clear and moist.  Eyes: Pupils are equal, round, and reactive to light.  Neck: Neck supple.  Cardiovascular: Normal rate and regular rhythm.  Pulmonary/Chest: Effort normal and breath sounds normal.  Abdominal: Soft. Bowel sounds are normal.  Musculoskeletal: She exhibits no edema.  Neurological: She is alert.  Doesn't Follow Any commands  Skin: Skin is warm and dry.  Psychiatric: She has a normal mood and affect. Her behavior is normal.    Labs reviewed: Recent Labs    11/17/17 0800 02/09/18 0721 05/19/18 0700  NA 139 141 139  K 4.0 4.2 4.3  CL 104 107 108  CO2 26 28 23   GLUCOSE 94 95 102*  BUN 23* 23 25*  CREATININE 0.66 0.70 0.70  CALCIUM 8.9 8.8* 8.6*   Recent Labs    02/09/18 0721  AST 16  ALT 13  ALKPHOS 49  BILITOT 0.6  PROT 6.0*  ALBUMIN 3.1*   Recent Labs    11/17/17 0800 02/09/18 0721 05/19/18 0700  WBC 4.6 4.9 4.1  NEUTROABS 2.0 2.4 2.0  HGB 11.4* 11.0* 10.4*  HCT 36.5 34.7* 34.1*  MCV 98.1 99.1 100.9*  PLT 163 182 212   Lab Results  Component Value Date   TSH 4.388 02/09/2018   No results found for: HGBA1C No results found for: CHOL, HDL, LDLCALC, LDLDIRECT, TRIG, CHOLHDL  Significant Diagnostic Results in last 30 days:  No results  found.  Assessment/Plan Hypothyroidism TSH was WNL in 07/19 Continue same dose of Synthroid Will repeat TSH  Dementia with behavioral disturbances On Namenda and Celexa Is followed by in patient Psychiatry Last Seen in 05/19  Essential hypertension Not on any meds  Senile osteoporosis Comfort care pain control with fentanyl Anemia Macrocytosis Will repeat CBC with Vit B12 Mycotic toenails Is followed by podiatry  Family/ staff Communication:   Labs/tests ordered:

## 2018-07-13 ENCOUNTER — Encounter (HOSPITAL_COMMUNITY)
Admission: RE | Admit: 2018-07-13 | Discharge: 2018-07-13 | Disposition: A | Discharge status: Home / Self Care | Payer: Medicare Other | Source: Skilled Nursing Facility | Attending: Internal Medicine | Admitting: Internal Medicine

## 2018-07-13 DIAGNOSIS — F0390 Unspecified dementia without behavioral disturbance: Secondary | ICD-10-CM | POA: Insufficient documentation

## 2018-07-13 DIAGNOSIS — M810 Age-related osteoporosis without current pathological fracture: Secondary | ICD-10-CM | POA: Diagnosis not present

## 2018-07-13 DIAGNOSIS — M1990 Unspecified osteoarthritis, unspecified site: Secondary | ICD-10-CM | POA: Insufficient documentation

## 2018-07-13 LAB — CBC
HCT: 34.3 % — ABNORMAL LOW (ref 36.0–46.0)
Hemoglobin: 10.6 g/dL — ABNORMAL LOW (ref 12.0–15.0)
MCH: 31.1 pg (ref 26.0–34.0)
MCHC: 30.9 g/dL (ref 30.0–36.0)
MCV: 100.6 fL — AB (ref 80.0–100.0)
PLATELETS: 165 10*3/uL (ref 150–400)
RBC: 3.41 MIL/uL — AB (ref 3.87–5.11)
RDW: 13.4 % (ref 11.5–15.5)
WBC: 4.5 10*3/uL (ref 4.0–10.5)
nRBC: 0 % (ref 0.0–0.2)

## 2018-07-13 LAB — BASIC METABOLIC PANEL
Anion gap: 6 (ref 5–15)
BUN: 29 mg/dL — AB (ref 8–23)
CO2: 24 mmol/L (ref 22–32)
Calcium: 8.9 mg/dL (ref 8.9–10.3)
Chloride: 112 mmol/L — ABNORMAL HIGH (ref 98–111)
Creatinine, Ser: 0.74 mg/dL (ref 0.44–1.00)
GFR calc Af Amer: >60 mL/min (ref >60)
GFR calc non Af Amer: >60 mL/min (ref >60)
GLUCOSE: 144 mg/dL — AB (ref 70–99)
POTASSIUM: 3.9 mmol/L (ref 3.5–5.1)
Sodium: 142 mmol/L (ref 135–145)

## 2018-07-13 LAB — VITAMIN B12: Vitamin B-12: 79 pg/mL — ABNORMAL LOW (ref 180–914)

## 2018-07-14 ENCOUNTER — Encounter: Payer: Self-pay | Admitting: Internal Medicine

## 2018-07-14 ENCOUNTER — Non-Acute Institutional Stay (SKILLED_NURSING_FACILITY): Payer: Medicare Other | Admitting: Internal Medicine

## 2018-07-14 DIAGNOSIS — E538 Deficiency of other specified B group vitamins: Secondary | ICD-10-CM | POA: Diagnosis not present

## 2018-07-14 DIAGNOSIS — I10 Essential (primary) hypertension: Secondary | ICD-10-CM

## 2018-07-14 LAB — FOLATE RBC
FOLATE, HEMOLYSATE: 316.2 ng/mL
Folate, RBC: 970 ng/mL (ref >498)
HEMATOCRIT: 32.6 % — AB (ref 34.0–46.6)

## 2018-07-14 NOTE — Progress Notes (Signed)
Location:    [facility] Room Number: 141/W Place of Service:  SNF [phone]) Provider:  Freddi Starr, MD  Patient Care Team: Virgie Dad, MD as PCP - General (Internal Medicine)  Extended Emergency Contact Information Primary Emergency Contact: Noreene Larsson of [place] Phone: [phone] Mobile Phone: [phone] Relation: Niece Secondary Emergency Contact: Harvie Bridge States of [place] Phone: [phone] Mobile Phone: [phone] Relation: Sister  Code Status:  DNR Goals of care: Advanced Directive information Advanced Directives 07/14/2018  Does Patient Have a Medical Advance Directive? Yes  Type of Advance Directive Out of facility DNR (pink MOST or yellow form)  Does patient want to make changes to medical advance directive? No - Patient declined  Copy of [place] in Chart? No - copy requested  Would patient like information on creating a medical advance directive? No - Patient declined  Pre-existing out of facility DNR order (yellow form or pink MOST form) -     Chief Complaint  Patient presents with  . Acute Visit    F/U B12   Chief complaint-acute visit follow-up B12 deficiency.    HPI:  Pt is a 84 y.o. female seen today for an acute visit for follow-up of B12 deficiency.  Patient recently had a routine visit and labs were ordered including a B12 level since she does have mild macrocytosis- B12 level did come back significantly low at 79.  Hemoglobin appears to be relatively stable at 10.6.  She is a long-term resident of facility with a history of advanced dementia as well as osteoporosis as well as a right hip fracture followed by left hip fracture- family opted for no surgical repair she has been pain management in the facility and largely under comfort care.  She continues to do well nursing does not report any recent issues.  Vital signs appear to be stable  she is listed pulse occasionally in the 50s but I got 74 on exam blood pressure was stable at 120/62.  She is lost a small amount of weight currently 124 pounds.  Currently she is lying in bed comfortably she is largely nonverbal at baseline but appears to be comfortable.     Past Medical History:  Diagnosis Date  . Dementia ([place])   . Osteoarthritis    Past Surgical History:  Procedure Laterality Date  . BREAST SURGERY    . EYE SURGERY    . HIP ARTHROPLASTY Right 01/01/2014   Procedure: BIPOLAR ARTHROPLASTY RIGHT HIP;  Surgeon: Carole Civil, MD;  Location: [facility];  Service: Orthopedics;  Laterality: Right;    Allergies  Allergen Reactions  . Morphine And Related     Increased confusion    Outpatient Encounter Medications as of 07/14/2018  Medication Sig  . acetaminophen (TYLENOL) 325 MG suppository Place 325 mg rectally every 4 (four) hours as needed.  Marland Kitchen acetaminophen (TYLENOL) 500 MG tablet Take  two tablets by mouth every 6 hours as needed for mild to moderate pain. DNE 3000mg  Tylenol in 24 hours from all sources.  Marland Kitchen acetaminophen (TYLENOL) 500 MG tablet Take 500 mg by mouth every 6 (six) hours as needed.  . calcium-vitamin D (OSCAL WITH D) 500-200 MG-UNIT tablet Take 1 tablet by mouth 2 (two) times daily.  . citalopram (CELEXA) 10 MG tablet Take 20 mg by mouth daily.   Marland Kitchen Dextromethorphan-Guaifenesin 5-100 MG/5ML LIQD Give 15 ml by mouth every 6 hours prn  . fentaNYL (  DURAGESIC - DOSED MCG/HR) 12 MCG/HR Apply one patch topically as directed every 72 hours (every 3 days) note site. Remove old patch before applying new patch  . HYDROcodone-acetaminophen (HYCET) 7.5-325 mg/15 ml solution Give 95ml by mouth every 6 hours as needed for pain. Max APAP 3gm/24 hours from all sources  . levothyroxine (SYNTHROID, LEVOTHROID) 25 MCG tablet Take 37.5 mcg by mouth daily before breakfast.   . loratadine (CLARITIN) 10 MG tablet Take 10 mg by mouth daily.  . memantine (NAMENDA) 5 MG  tablet Take 5 mg by mouth daily.  . NON FORMULARY Magic cup give once a  day  . Nutritional Supplements (ENSURE ENLIVE PO) One by mouth daily  . senna-docusate (SENOKOT-S) 8.6-50 MG per tablet Take 1 tablet by mouth at bedtime.   No facility-administered encounter medications on file as of 07/14/2018.     Review of Systems Unobtainable secondary to dementia Immunization History  Administered Date(s) Administered  . Influenza-Unspecified 05/10/2014, 05/02/2016, 05/08/2017  . Pneumococcal Polysaccharide-23 01/02/2014  . Tdap 01/01/2014, 04/16/2017   Pertinent  Health Maintenance Due  Topic Date Due  . INFLUENZA VACCINE  Completed  . DEXA SCAN  Completed  . PNA vac Low Risk Adult  Completed   Fall Risk  05/03/2018 04/23/2017  Falls in the past year? No No   Functional Status Survey:    Of note manual blood pressure today was 120/62- pulse was 74 she is afebrile respirations were 17  Physical Exam   In general this is a somewhat frail but stable appearing elderly female in no distress lying in bed she is nonverbal.  Her skin is warm and dry.  Eyes visual acuity appears to be intact her sclera and conjunctive are clear.  Oropharynx was difficult to assess since patient did not really open her mouth.  Heart is regular rate and rhythm with an occasional irregular beat she does not have significant lower extremity edema.  Chest is clear to auscultation with poor respiratory effort there is no labored breathing.  Abdomen is soft nontender with positive bowel sounds.  Musculoskeletal--continues with generalized weakness with relative immobility of her lower extremities because of the hip fractures she does have boots on bilaterally-  Neurologic she does make eye contact is not speaking today this appears grossly baseline.  Psych again findings consistent with significant dementia and she is nonverbal today but not agitated with exam  Labs reviewed: Recent Labs     02/09/18 0721 05/19/18 0700 07/13/18 0700  NA 141 139 142  K 4.2 4.3 3.9  CL 107 108 112*  CO2 28 23 24   GLUCOSE 95 102* 144*  BUN 23 25* 29*  CREATININE 0.70 0.70 0.74  CALCIUM 8.8* 8.6* 8.9   Recent Labs    02/09/18 0721  AST 16  ALT 13  ALKPHOS 49  BILITOT 0.6  PROT 6.0*  ALBUMIN 3.1*   Recent Labs    11/17/17 0800 02/09/18 0721 05/19/18 0700 07/13/18 0700  WBC 4.6 4.9 4.1 4.5  NEUTROABS 2.0 2.4 2.0  --   HGB 11.4* 11.0* 10.4* 10.6*  HCT 36.5 34.7* 34.1* 34.3*  MCV 98.1 99.1 100.9* 100.6*  PLT 163 182 212 165   Lab Results  Component Value Date   TSH 4.388 02/09/2018   No results found for: HGBA1C No results found for: CHOL, HDL, LDLCALC, LDLDIRECT, TRIG, CHOLHDL  Significant Diagnostic Results in last 30 days:  No results found.  Assessment/Plan  #1-B12 deficiency.-She appears to be significantly deficient- her hemoglobin  shows relative stability at 10.6- will write an order for thousand micrograms of B12 IM daily for a week and then q. weekly x4 weeks and update a B12 level- considering patient's comfort care status will have nursing discussed this with her responsible party about aggressiveness of care- i suspect patient would not really be able to use the inhaled B12- again will await family input- on aggressiveness here.  2.  Hypertension not on any medications blood pressures have been stable manual reading was 120/62 today.  I note occasional bradycardic readings but this is not the case today heart rate is largely regular will monitor   [phone]

## 2018-07-21 ENCOUNTER — Other Ambulatory Visit: Payer: Self-pay

## 2018-07-21 MED ORDER — FENTANYL 12 MCG/HR TD PT72: MEDICATED_PATCH | TRANSDERMAL | 0 refills | Status: DC

## 2018-07-21 NOTE — Telephone Encounter (Signed)
RX Fax for [facility]@ [phone]  

## 2018-08-06 ENCOUNTER — Non-Acute Institutional Stay (SKILLED_NURSING_FACILITY): Payer: Medicare Other | Admitting: Internal Medicine

## 2018-08-06 ENCOUNTER — Encounter: Payer: Self-pay | Admitting: Internal Medicine

## 2018-08-06 DIAGNOSIS — E039 Hypothyroidism, unspecified: Secondary | ICD-10-CM

## 2018-08-06 DIAGNOSIS — M810 Age-related osteoporosis without current pathological fracture: Secondary | ICD-10-CM | POA: Diagnosis not present

## 2018-08-06 DIAGNOSIS — E538 Deficiency of other specified B group vitamins: Secondary | ICD-10-CM

## 2018-08-06 DIAGNOSIS — S72002A Fracture of unspecified part of neck of left femur, initial encounter for closed fracture: Secondary | ICD-10-CM | POA: Diagnosis not present

## 2018-08-06 DIAGNOSIS — F0390 Unspecified dementia without behavioral disturbance: Secondary | ICD-10-CM

## 2018-08-06 NOTE — Progress Notes (Signed)
Location:    [facility] Room Number: 141/W Place of Service:  SNF [phone]) Provider:  Granville Lewis PA-C  Virgie Dad, MD  Patient Care Team: Virgie Dad, MD as PCP - General (Internal Medicine)  Extended Emergency Contact Information Primary Emergency Contact: Noreene Larsson of [place] Phone: [phone] Mobile Phone: [phone] Relation: Niece Secondary Emergency Contact: Harvie Bridge States of [place] Phone: [phone] Mobile Phone: [phone] Relation: Sister  Code Status:  DNR Goals of care: Advanced Directive information Advanced Directives 08/06/2018  Does Patient Have a Medical Advance Directive? Yes  Type of Advance Directive Out of facility DNR (pink MOST or yellow form)  Does patient want to make changes to medical advance directive? No - Patient declined  Copy of [place] in Chart? No - copy requested  Would patient like information on creating a medical advance directive? No - Patient declined  Pre-existing out of facility DNR order (yellow form or pink MOST form) -     Chief Complaint  Patient presents with  . Medical Management of Chronic Issues    Routine visit of medical management  For management of chronic medical issues including dementia- history of bilateral hip fractures-osteoporosis-B12 deficiency-depression-constipation-as well as hypothyroidism  HPI:  Pt is a 85 y.o. female seen today for medical management of chronic diseases.  As noted above .  Nursing staff does not report any issues her weight appears relatively stable at 125 pounds- patient has severe dementia and cannot really give any review of systems.  She does have a history of a right hip fracture followed by left hip fracture secondary to a fall in July 2016 family did not want surgical intervention she is on pain management which appears to be effective including Duragesic patch and Norco and Tylenol as  needed.  She was noted to have B12 deficiency on recent lab this is being supplemented and updated lab is pending for later this month.  Regards to osteoporosis she continues on calcium with vitamin D.  She also has a history of hypothyroidism she is on Synthroid TSH in July was 4.388 we will update this.  I see a listed blood pressure with a systolic in the 97D however I took it manually and got 128/50 which appears to be more her baseline Past Medical History:  Diagnosis Date  . Dementia ([place])   . Osteoarthritis    Past Surgical History:  Procedure Laterality Date  . BREAST SURGERY    . EYE SURGERY    . HIP ARTHROPLASTY Right 01/01/2014   Procedure: BIPOLAR ARTHROPLASTY RIGHT HIP;  Surgeon: Carole Civil, MD;  Location: [facility];  Service: Orthopedics;  Laterality: Right;    Allergies  Allergen Reactions  . Morphine And Related     Increased confusion    Outpatient Encounter Medications as of 08/06/2018  Medication Sig  . acetaminophen (TYLENOL) 325 MG suppository Place 325 mg rectally every 4 (four) hours as needed.  Marland Kitchen acetaminophen (TYLENOL) 500 MG tablet Take  two tablets by mouth every 6 hours as needed for mild to moderate pain. DNE 3000mg  Tylenol in 24 hours from all sources.  Marland Kitchen acetaminophen (TYLENOL) 500 MG tablet Take 500 mg by mouth every 6 (six) hours as needed.  . calcium-vitamin D (OSCAL WITH D) 500-200 MG-UNIT tablet Take 1 tablet by mouth 2 (two) times daily.  . citalopram (CELEXA) 10 MG tablet Take 20 mg by mouth daily.   Marland Kitchen Dextromethorphan-Guaifenesin 5-100  MG/5ML LIQD Give 15 ml by mouth every 6 hours prn  . fentaNYL (DURAGESIC - DOSED MCG/HR) 12 MCG/HR Apply one patch topically as directed every 72 hours (every 3 days) note site. Remove old patch before applying new patch  . HYDROcodone-acetaminophen (HYCET) 7.5-325 mg/15 ml solution Give 75ml by mouth every 6 hours as needed for pain. Max APAP 3gm/24 hours from all sources  . levothyroxine (SYNTHROID,  LEVOTHROID) 25 MCG tablet Take 37.5 mcg by mouth daily before breakfast.   . loratadine (CLARITIN) 10 MG tablet Take 10 mg by mouth daily.  . memantine (NAMENDA) 5 MG tablet Take 5 mg by mouth daily.  . NON FORMULARY Magic cup give once a  day  . Nutritional Supplements (ENSURE ENLIVE PO) One by mouth daily  . senna-docusate (SENOKOT-S) 8.6-50 MG per tablet Take 1 tablet by mouth at bedtime.  . vitamin B-12 (CYANOCOBALAMIN) 1000 MCG tablet Take 1,000 mcg by mouth daily. On Tues.   No facility-administered encounter medications on file as of 08/06/2018.      Review of Systems   Is unobtainable secondary to dementia  Immunization History  Administered Date(s) Administered  . Influenza-Unspecified 05/10/2014, 05/02/2016, 05/08/2017  . Pneumococcal Polysaccharide-23 01/02/2014  . Tdap 01/01/2014, 04/16/2017   Pertinent  Health Maintenance Due  Topic Date Due  . INFLUENZA VACCINE  Completed  . DEXA SCAN  Completed  . PNA vac Low Risk Adult  Completed   Fall Risk  05/03/2018 04/23/2017  Falls in the past year? No No   Functional Status Survey:    Vitals:   08/06/18 1518  BP: (!) 97/53  Pulse: 63  Resp: 18  Temp: (!) 97 F (36.1 C)  TempSrc: Oral  SpO2: 95%  Weight: 125 lb (56.7 kg)  Height: 5\' 3"  (1.6 m)  Body mass index is 22.14 kg/m. Physical Exam   As noted above manual blood pressure was 128/50.  In general this is a somewhat frail elderly female in no distress lying comfortably in bed she does make eye contact and she is alert but does not speak.  Her skin is warm and dry.  Eyes visual acuity appears to be intact sclera and conjunctive are clear  Oropharynx appears to be clear mucous membranes moist.  Chest is clear to auscultation she does not really follow verbal commands but could not appreciate any sign of labored breathing or significant congestion  Heart is regular rate and rhythm without murmur gallop or rub  Abdomen is soft nontender with positive  bowel sounds.  Musculoskeletal continues with generalized weakness has immobility of her lower extremities because of the hip fractures she has protective boots on bilaterally-I do not note any edema.  Neurologic she is alert is making eye contact which is her norm does not speak.  This appears to be baseline with previous exams.  And psych again does have significant dementia no behaviors have been noted by staff Labs reviewed: Recent Labs    02/09/18 0721 05/19/18 0700 07/13/18 0700  NA 141 139 142  K 4.2 4.3 3.9  CL 107 108 112*  CO2 28 23 24   GLUCOSE 95 102* 144*  BUN 23 25* 29*  CREATININE 0.70 0.70 0.74  CALCIUM 8.8* 8.6* 8.9   Recent Labs    02/09/18 0721  AST 16  ALT 13  ALKPHOS 49  BILITOT 0.6  PROT 6.0*  ALBUMIN 3.1*   Recent Labs    11/17/17 0800 02/09/18 0721 05/19/18 0700 07/13/18 0700  WBC 4.6 4.9  4.1 4.5  NEUTROABS 2.0 2.4 2.0  --   HGB 11.4* 11.0* 10.4* 10.6*  HCT 36.5 34.7* 34.1* 34.3*  32.6*  MCV 98.1 99.1 100.9* 100.6*  PLT 163 182 212 165   Lab Results  Component Value Date   TSH 4.388 02/09/2018   No results found for: HGBA1C No results found for: CHOL, HDL, LDLCALC, LDLDIRECT, TRIG, CHOLHDL  Significant Diagnostic Results in last 30 days:  No results found.  Assessment/Plan  #1 history of advanced dementia she appears to be doing well with supportive care she does often have a sitter--- no behaviors have been noted-- she continues on low-dose Namenda--- her weight appears to be fairly stabilized  #2 history of bilateral hip fractures as noted above pain management appears to be effective includes Duragesic patch as well as PRN Norco and Tylenol this appears to be well-tolerated.  3.  Osteoporosis she is on calcium and vitamin D.  4.  History of B12 deficiency recently diagnosed she is on supplementation update lab is pending in mid January.  5.  History of constipation continues on Senokot apparently this has not been an issue  in some time.  6.  History of hypothyroidism she is on Synthroid TSH was within normal limits back in July will have this updated.  7.  History of depression she is on Celexa again she also continues on Namenda with a history of dementia  8.  Hypertension?-I do not see evidence of this blood pressure taken manually today was stable I do note listed earlier reading with systolic in the 16X I suspect this may be a machine variation.  She is not on any high antihypertensives  [phone]

## 2018-08-09 ENCOUNTER — Encounter (HOSPITAL_COMMUNITY)
Admission: RE | Admit: 2018-08-09 | Discharge: 2018-08-09 | Disposition: A | Discharge status: Home / Self Care | Payer: Medicare Other | Source: Skilled Nursing Facility | Attending: Internal Medicine | Admitting: Internal Medicine

## 2018-08-09 DIAGNOSIS — M810 Age-related osteoporosis without current pathological fracture: Secondary | ICD-10-CM | POA: Diagnosis not present

## 2018-08-09 DIAGNOSIS — Z8781 Personal history of (healed) traumatic fracture: Secondary | ICD-10-CM | POA: Insufficient documentation

## 2018-08-09 DIAGNOSIS — F0390 Unspecified dementia without behavioral disturbance: Secondary | ICD-10-CM | POA: Diagnosis not present

## 2018-08-09 DIAGNOSIS — M1990 Unspecified osteoarthritis, unspecified site: Secondary | ICD-10-CM | POA: Diagnosis not present

## 2018-08-09 LAB — TSH: TSH: 5.143 u[IU]/mL — ABNORMAL HIGH (ref 0.350–4.500)

## 2018-08-17 ENCOUNTER — Encounter (HOSPITAL_COMMUNITY)
Admission: RE | Admit: 2018-08-17 | Discharge: 2018-08-17 | Disposition: A | Discharge status: Home / Self Care | Payer: Medicare Other | Source: Skilled Nursing Facility | Attending: Internal Medicine | Admitting: Internal Medicine

## 2018-08-17 DIAGNOSIS — F0390 Unspecified dementia without behavioral disturbance: Secondary | ICD-10-CM | POA: Insufficient documentation

## 2018-08-17 DIAGNOSIS — M810 Age-related osteoporosis without current pathological fracture: Secondary | ICD-10-CM | POA: Insufficient documentation

## 2018-08-17 DIAGNOSIS — M1990 Unspecified osteoarthritis, unspecified site: Secondary | ICD-10-CM | POA: Insufficient documentation

## 2018-08-17 LAB — VITAMIN B12: Vitamin B-12: 1046 pg/mL — ABNORMAL HIGH (ref 180–914)

## 2018-08-26 ENCOUNTER — Other Ambulatory Visit: Payer: Self-pay

## 2018-08-26 MED ORDER — FENTANYL 12 MCG/HR TD PT72: MEDICATED_PATCH | TRANSDERMAL | 0 refills | Status: DC

## 2018-08-26 NOTE — Telephone Encounter (Signed)
RX Fax for [facility]@ [phone]  

## 2018-09-02 ENCOUNTER — Encounter: Payer: Self-pay | Admitting: Internal Medicine

## 2018-09-02 ENCOUNTER — Non-Acute Institutional Stay (SKILLED_NURSING_FACILITY): Payer: Medicare Other | Admitting: Internal Medicine

## 2018-09-02 DIAGNOSIS — R7989 Other specified abnormal findings of blood chemistry: Secondary | ICD-10-CM

## 2018-09-02 DIAGNOSIS — E538 Deficiency of other specified B group vitamins: Secondary | ICD-10-CM | POA: Diagnosis not present

## 2018-09-02 DIAGNOSIS — J00 Acute nasopharyngitis [common cold]: Secondary | ICD-10-CM | POA: Diagnosis not present

## 2018-09-02 DIAGNOSIS — R05 Cough: Secondary | ICD-10-CM | POA: Diagnosis not present

## 2018-09-02 NOTE — Progress Notes (Signed)
Location:    [facility] Room Number: 141/W Place of Service:  SNF [phone]) Provider:  Freddi Starr, MD  Patient Care Team: Virgie Dad, MD as PCP - General (Internal Medicine)  Extended Emergency Contact Information Primary Emergency Contact: Noreene Larsson of [place] Phone: [phone] Mobile Phone: [phone] Relation: Niece Secondary Emergency Contact: Harvie Bridge States of [place] Phone: [phone] Mobile Phone: [phone] Relation: Sister  Code Status:  DNR Goals of care: Advanced Directive information Advanced Directives 09/02/2018  Does Patient Have a Medical Advance Directive? Yes  Type of Advance Directive Out of facility DNR (pink MOST or yellow form)  Does patient want to make changes to medical advance directive? No - Patient declined  Copy of [place]enio Saenz in Chart? No - copy requested  Would patient like information on creating a medical advance directive? No - Patient declined  Pre-existing out of facility DNR order (yellow form or pink MOST form) -     Chief Complaint  Patient presents with  . Acute Visit    Cold Symptoms    HPI:  Pt is a 85 y.o. female seen today for an acute visit for apparent cold symptoms.  Patient is a long-term resident of facility with a history of dementia bilateral hip fractures osteoporosis B12 deficiency depression constipation as well as hypothyroidism.  Patient apparently has developed some cold-like symptoms and chest congestion per family-she does have a low-grade elevated temp of 99.1-oxygen saturation is 94% on room air.  Patient has severe dementia and does not really talk.  Currently she appears to be resting in bed comfortably-- her niece is at bedside   Nursing staff has been giving her some Robitussin.  Does have a history of B12 deficiency but this appears to have improved with some supplementation with a level 1046  on lab done on January 14.  She also had a TSH done earlier this month which was minimally elevated at 5.143 which is just mildly up from what she has been running baseline in the low fours.         Past Medical History:  Diagnosis Date  . Dementia ([place])   . Osteoarthritis    Past Surgical History:  Procedure Laterality Date  . BREAST SURGERY    . EYE SURGERY    . HIP ARTHROPLASTY Right 01/01/2014   Procedure: BIPOLAR ARTHROPLASTY RIGHT HIP;  Surgeon: Carole Civil, MD;  Location: [facility];  Service: Orthopedics;  Laterality: Right;    Allergies  Allergen Reactions  . Morphine And Related     Increased confusion    Outpatient Encounter Medications as of 09/02/2018  Medication Sig  . acetaminophen (TYLENOL) 325 MG suppository Place 325 mg rectally every 4 (four) hours as needed.  Marland Kitchen acetaminophen (TYLENOL) 500 MG tablet Take  two tablets by mouth every 6 hours as needed for mild to moderate pain. DNE 3000mg  Tylenol in 24 hours from all sources.  Marland Kitchen acetaminophen (TYLENOL) 500 MG tablet Take 500 mg by mouth every 6 (six) hours as needed.  . calcium-vitamin D (OSCAL WITH D) 500-200 MG-UNIT tablet Take 1 tablet by mouth 2 (two) times daily.  . citalopram (CELEXA) 10 MG tablet Take 20 mg by mouth daily.   Marland Kitchen Dextromethorphan-Guaifenesin 5-100 MG/5ML LIQD Give 15 ml by mouth every 6 hours prn  . fentaNYL (DURAGESIC) 12 MCG/HR Apply one patch topically as directed every 72 hours (every 3 days) note site. Remove  old patch before applying new patch  . HYDROcodone-acetaminophen (HYCET) 7.5-325 mg/15 ml solution Give 50ml by mouth every 6 hours as needed for pain. Max APAP 3gm/24 hours from all sources  . levothyroxine (SYNTHROID, LEVOTHROID) 25 MCG tablet Take 37.5 mcg by mouth daily before breakfast.   . loratadine (CLARITIN) 10 MG tablet Take 10 mg by mouth daily.  . memantine (NAMENDA) 5 MG tablet Take 5 mg by mouth daily.  . NON FORMULARY Magic cup give once a  day  . Nutritional  Supplements (ENSURE ENLIVE PO) One by mouth daily  . senna-docusate (SENOKOT-S) 8.6-50 MG per tablet Take 1 tablet by mouth at bedtime.  . [DISCONTINUED] vitamin B-12 (CYANOCOBALAMIN) 1000 MCG tablet Take 1,000 mcg by mouth daily. On Tues.   No facility-administered encounter medications on file as of 09/02/2018.     Review of Systems   Is unobtainable secondary to dementia  Immunization History  Administered Date(s) Administered  . Influenza-Unspecified 05/10/2014, 05/02/2016, 05/08/2017  . Pneumococcal Polysaccharide-23 01/02/2014  . Tdap 01/01/2014, 04/16/2017   Pertinent  Health Maintenance Due  Topic Date Due  . INFLUENZA VACCINE  Completed  . DEXA SCAN  Completed  . PNA vac Low Risk Adult  Completed   Fall Risk  05/03/2018 04/23/2017  Falls in the past year? No No   Functional Status Survey:    Vitals:   09/02/18 1119  BP: (!) 90/51  Pulse: 65  Resp: 20  Temp: 99.1 F (37.3 C)  TempSrc: Oral  SpO2: 94%   Manual blood pressure was 94/54  Physical Exam   In general this is a somewhat frail elderly female in no distress lying comfortably in bed she is smiling.  Her skin is warm and dry.  Eyes could not really appreciate any drainage.  Oropharynx was limited exam since she did not open her mouth very wide could not appreciate any abnormalities.  Chest she does not really follow verbal commands could appreciate some slight chest congestion coarse breath sounds upper fields.  Her niece apparently has noted this as well.  Heart is distant heart sounds regular rate and rhythm she does not really have significant lower extremity edema.  Abdomen is soft nontender with positive bowel sounds.  Musculoskeletal has generalized weakness with immobility of her lower extremities because of her hip fracture she has protective boots on bilaterally.  Neurologic she is alert does not really follow verbal commands she is smiling peers to be comfortable.    Labs  reviewed: Recent Labs    02/09/18 0721 05/19/18 0700 07/13/18 0700  NA 141 139 142  K 4.2 4.3 3.9  CL 107 108 112*  CO2 28 23 24   GLUCOSE 95 102* 144*  BUN 23 25* 29*  CREATININE 0.70 0.70 0.74  CALCIUM 8.8* 8.6* 8.9   Recent Labs    02/09/18 0721  AST 16  ALT 13  ALKPHOS 49  BILITOT 0.6  PROT 6.0*  ALBUMIN 3.1*   Recent Labs    11/17/17 0800 02/09/18 0721 05/19/18 0700 07/13/18 0700  WBC 4.6 4.9 4.1 4.5  NEUTROABS 2.0 2.4 2.0  --   HGB 11.4* 11.0* 10.4* 10.6*  HCT 36.5 34.7* 34.1* 34.3*  32.6*  MCV 98.1 99.1 100.9* 100.6*  PLT 163 182 212 165   Lab Results  Component Value Date   TSH 5.143 (H) 08/09/2018   No results found for: HGBA1C No results found for: CHOL, HDL, LDLCALC, LDLDIRECT, TRIG, CHOLHDL  Significant Diagnostic Results in last 30 days:  No results found.  Assessment/Plan  1--- #1 history of cold symptoms-with slight chest congestion will order a chest x-ray PA and lateral monitor vital signs pulse ox every shift for 72 hours patient cannot really give any history here but does not appear to be in any distress.  Will make Robitussin routine every 6 hours for 72 hours and then as needed.  2.  History of mildly elevated TSH at this point will monitor she appears relatively at her baseline- considering her advanced age and comorbidities would be hesitant to be real aggressive adding medication.  3.  B12 deficiency this appears to have resolved with supplementation currently not on B12 supplement.  [id number]

## 2018-09-03 ENCOUNTER — Non-Acute Institutional Stay (SKILLED_NURSING_FACILITY): Payer: Medicare Other | Admitting: Internal Medicine

## 2018-09-03 ENCOUNTER — Encounter: Payer: Self-pay | Admitting: Internal Medicine

## 2018-09-03 DIAGNOSIS — R509 Fever, unspecified: Secondary | ICD-10-CM

## 2018-09-03 NOTE — Progress Notes (Signed)
Location:    [facility] Room Number: 141/W Place of Service:  SNF [phone]) Provider:  Freddi Starr, MD  Patient Care Team: Virgie Dad, MD as PCP - General (Internal Medicine)  Extended Emergency Contact Information Primary Emergency Contact: Noreene Larsson of [place] Phone: [phone] Mobile Phone: [phone] Relation: Niece Secondary Emergency Contact: Harvie Bridge States of [place] Phone: [phone] Mobile Phone: [phone] Relation: Sister  Code Status:  DNR Goals of care: Advanced Directive information Advanced Directives 09/03/2018  Does Patient Have a Medical Advance Directive? Yes  Type of Advance Directive Out of facility DNR (pink MOST or yellow form)  Does patient want to make changes to medical advance directive? No - Patient declined  Copy of [place] in Chart? No - copy requested  Would patient like information on creating a medical advance directive? No - Patient declined  Pre-existing out of facility DNR order (yellow form or pink MOST form) -     Chief Complaint  Patient presents with  . Acute Visit    F/U Fever    HPI:  Pt is a 85 y.o. female seen today for an acute visit for a low-grade elevated temperature.  She was seen yesterday for a head cold and we did make Robitussin routine through February 2 with monitoring of vital signs as well.  She continues to have a low-grade temp at times it currently 100.0 she does not appear to be in any distress-apparently has a semi-productive cough.  She has severe dementia and cannot give any review of systems but appears essentially at her baseline today she does make eye contact is smiling does not appear to be feeling unwell.  She is a long-term resident of the facility history of dementia as well as bilateral hip fractures osteoporosis B12 deficiency depression constipation and hypothyroidism   Past Medical  History:  Diagnosis Date  . Dementia ([place])   . Osteoarthritis    Past Surgical History:  Procedure Laterality Date  . BREAST SURGERY    . EYE SURGERY    . HIP ARTHROPLASTY Right 01/01/2014   Procedure: BIPOLAR ARTHROPLASTY RIGHT HIP;  Surgeon: Carole Civil, MD;  Location: [facility];  Service: Orthopedics;  Laterality: Right;    Allergies  Allergen Reactions  . Morphine And Related     Increased confusion    Outpatient Encounter Medications as of 09/03/2018  Medication Sig  . acetaminophen (TYLENOL) 325 MG suppository Place 325 mg rectally every 4 (four) hours as needed.  Marland Kitchen acetaminophen (TYLENOL) 500 MG tablet Take  two tablets by mouth every 6 hours as needed for mild to moderate pain. DNE 3000mg  Tylenol in 24 hours from all sources.  Marland Kitchen acetaminophen (TYLENOL) 500 MG tablet Take 500 mg by mouth every 6 (six) hours as needed.  . calcium-vitamin D (OSCAL WITH D) 500-200 MG-UNIT tablet Take 1 tablet by mouth 2 (two) times daily.  . citalopram (CELEXA) 10 MG tablet Take 20 mg by mouth daily.   Marland Kitchen Dextromethorphan-Guaifenesin 5-100 MG/5ML LIQD Give 15 ml by mouth every 6 hours prn. Starting 10/02/2017  . Dextromethorphan-guaiFENesin 5-100 MG/5ML LIQD Take 5 mLs by mouth every 6 (six) hours as needed. Starting 09/06/2018  . Dextromethorphan-guaiFENesin 5-100 MG/5ML LIQD Take 5 mLs by mouth every 6 (six) hours as needed. Take from 09/02/2018-09/05/2018  . fentaNYL (DURAGESIC) 12 MCG/HR Apply one patch topically as directed every 72 hours (every 3 days) note site. Remove old  patch before applying new patch  . HYDROcodone-acetaminophen (HYCET) 7.5-325 mg/15 ml solution Give 8ml by mouth every 6 hours as needed for pain. Max APAP 3gm/24 hours from all sources  . levothyroxine (SYNTHROID, LEVOTHROID) 25 MCG tablet Take 37.5 mcg by mouth daily before breakfast.   . loratadine (CLARITIN) 10 MG tablet Take 10 mg by mouth daily.  . memantine (NAMENDA) 5 MG tablet Take 5 mg by mouth daily.  . NON  FORMULARY Magic cup give once a  day  . Nutritional Supplements (ENSURE ENLIVE PO) One by mouth daily  . senna-docusate (SENOKOT-S) 8.6-50 MG per tablet Take 1 tablet by mouth at bedtime.   No facility-administered encounter medications on file as of 09/03/2018.     Review of Systems   Is unobtainable secondary to dementia  Immunization History  Administered Date(s) Administered  . Influenza-Unspecified 05/10/2014, 05/02/2016, 05/08/2017  . Pneumococcal Polysaccharide-23 01/02/2014  . Tdap 01/01/2014, 04/16/2017   Pertinent  Health Maintenance Due  Topic Date Due  . INFLUENZA VACCINE  Completed  . DEXA SCAN  Completed  . PNA vac Low Risk Adult  Completed   Fall Risk  05/03/2018 04/23/2017  Falls in the past year? No No   Functional Status Survey:    Vitals:   09/03/18 1450  BP: 100/67  Pulse: 68  Resp: 20  Temp: 100 F (37.8 C)  TempSrc: Oral  SpO2: 95%    Physical Exam   In general this is a somewhat frail elderly female in no distress lying comfortably in bed she appears to be at her baseline.  Her skin is warm and dry.  Eyes visual acuity appears grossly intact sclera and conjunctive are clear.  Oropharynx from what I could assess appeared to be clear again very limited since she did not open her mouth very wide.  Chest could not really appreciate overt congestion today she has poor respiratory effort and does not follow verbal commands there is no labored breathing.  Heart is regular rate and rhythm without murmur gallop or rub she does not have significant lower extremity edema.  Her abdomen is soft nontender with positive bowel sounds.  Musculoskeletal has generalized weakness and immobility of her lower extremities because of her hip fractures she continues with protective boots she does not have significant edema.  Neurologic is grossly intact at baseline she does not really speak findings consistent with severe dementia she is alert.  Psych as noted  above could not really be assessed because of her severe dementia  Labs reviewed: Recent Labs    02/09/18 0721 05/19/18 0700 07/13/18 0700  NA 141 139 142  K 4.2 4.3 3.9  CL 107 108 112*  CO2 28 23 24   GLUCOSE 95 102* 144*  BUN 23 25* 29*  CREATININE 0.70 0.70 0.74  CALCIUM 8.8* 8.6* 8.9   Recent Labs    02/09/18 0721  AST 16  ALT 13  ALKPHOS 49  BILITOT 0.6  PROT 6.0*  ALBUMIN 3.1*   Recent Labs    11/17/17 0800 02/09/18 0721 05/19/18 0700 07/13/18 0700  WBC 4.6 4.9 4.1 4.5  NEUTROABS 2.0 2.4 2.0  --   HGB 11.4* 11.0* 10.4* 10.6*  HCT 36.5 34.7* 34.1* 34.3*  32.6*  MCV 98.1 99.1 100.9* 100.6*  PLT 163 182 212 165   Lab Results  Component Value Date   TSH 5.143 (H) 08/09/2018   No results found for: HGBA1C No results found for: CHOL, HDL, LDLCALC, LDLDIRECT, TRIG, CHOLHDL  Significant  Diagnostic Results in last 30 days:  No results found.  Assessment/Plan  #1-continued low-grade fever-patient does not appear to be in any distress but she does have somewhat persistent mildly elevated temp I suspect she is having most likely a URI- she is on Robitussin routinely for now and will have it as needed starting early next week-at this point continue to monitor vital signs pulse ox every shift for 72 hours.  And will add Augmentin 500 mg twice daily for 5 days and a probiotic twice daily for 7 days-secondary to concerns that this is a persistent URI.  [id number]

## 2018-09-05 ENCOUNTER — Encounter: Payer: Self-pay | Admitting: Internal Medicine

## 2018-09-21 ENCOUNTER — Non-Acute Institutional Stay (SKILLED_NURSING_FACILITY): Payer: Medicare Other | Admitting: Adult Health

## 2018-09-21 ENCOUNTER — Encounter: Payer: Self-pay | Admitting: Adult Health

## 2018-09-21 DIAGNOSIS — F32A Depression, unspecified: Secondary | ICD-10-CM

## 2018-09-21 DIAGNOSIS — E440 Moderate protein-calorie malnutrition: Secondary | ICD-10-CM

## 2018-09-21 DIAGNOSIS — G894 Chronic pain syndrome: Secondary | ICD-10-CM | POA: Diagnosis not present

## 2018-09-21 DIAGNOSIS — D649 Anemia, unspecified: Secondary | ICD-10-CM

## 2018-09-21 DIAGNOSIS — F329 Major depressive disorder, single episode, unspecified: Secondary | ICD-10-CM | POA: Diagnosis not present

## 2018-09-21 DIAGNOSIS — F0150 Vascular dementia without behavioral disturbance: Secondary | ICD-10-CM | POA: Diagnosis not present

## 2018-09-21 DIAGNOSIS — E039 Hypothyroidism, unspecified: Secondary | ICD-10-CM | POA: Diagnosis not present

## 2018-09-21 DIAGNOSIS — K5909 Other constipation: Secondary | ICD-10-CM

## 2018-09-21 NOTE — Progress Notes (Signed)
Location:   [place] Room Number: [place] of Service:  SNF (31)   CODE STATUS: DNR  Allergies  Allergen Reactions  . Morphine And Related     Increased confusion    Chief Complaint  Patient presents with  . Medical Management of Chronic Issues    Acquired hypothyroidism; vascular dementia without behavioral disturbance; chronic constipation;     HPI:  She is a 85 year old long term resident of this facility being seen for the management of her chronic illnesses: hypothyroidism; dementia; constipation. She does have a Actuary daily. There are no reports of choking; no uncontrolled pain; no constipation; no anxiety of agitation.   Past Medical History:  Diagnosis Date  . Dementia ([place])   . Osteoarthritis     Past Surgical History:  Procedure Laterality Date  . BREAST SURGERY    . EYE SURGERY    . HIP ARTHROPLASTY Right 01/01/2014   Procedure: BIPOLAR ARTHROPLASTY RIGHT HIP;  Surgeon: Carole Civil, MD;  Location: [facility];  Service: Orthopedics;  Laterality: Right;    Social History   Socioeconomic History  . Marital status: Widowed    Spouse name: Not on file  . Number of children: 0  . Years of education: 7th  . Highest education level: Not on file  Occupational History    Employer: RETIRED  Social Needs  . Financial resource strain: Not hard at all  . Food insecurity:    Worry: Never true    Inability: Never true  . Transportation needs:    Medical: No    Non-medical: No  Tobacco Use  . Smoking status: Never Smoker  . Smokeless tobacco: Never Used  Substance and Sexual Activity  . Alcohol use: No  . Drug use: No  . Sexual activity: Never  Lifestyle  . Physical activity:    Days per week: 0 days    Minutes per session: 0 min  . Stress: Not at all  Relationships  . Social connections:    Talks on phone: Never    Gets together: Once a week    Attends religious service: Never    Active member of club or  organization: No    Attends meetings of clubs or organizations: Never    Relationship status: Widowed  . Intimate partner violence:    Fear of current or ex partner: No    Emotionally abused: No    Physically abused: No    Forced sexual activity: No  Other Topics Concern  . Not on file  Social History Narrative  . Not on file   History reviewed. No pertinent family history.    VITAL SIGNS Ht 5\' 3"  (1.6 m)   Wt 121 lb (54.9 kg)   BMI 21.43 kg/m   Outpatient Encounter Medications as of 09/21/2018  Medication Sig  . acetaminophen (TYLENOL) 325 MG suppository Place 325 mg rectally every 4 (four) hours as needed.  Marland Kitchen acetaminophen (TYLENOL) 500 MG tablet Take  two tablets by mouth every 6 hours as needed for mild to moderate pain. DNE 3000mg  Tylenol in 24 hours from all sources.  Marland Kitchen acetaminophen (TYLENOL) 500 MG tablet Take 500 mg by mouth every 6 (six) hours as needed.  . calcium-vitamin D (OSCAL WITH D) 500-200 MG-UNIT tablet Take 1 tablet by mouth 2 (two) times daily.  . citalopram (CELEXA) 10 MG tablet Take 20 mg by mouth daily.   Marland Kitchen Dextromethorphan-guaiFENesin 5-100 MG/5ML LIQD Take 5 mLs by  mouth every 6 (six) hours as needed. Starting 09/06/2018  . fentaNYL (DURAGESIC) 12 MCG/HR Apply one patch topically as directed every 72 hours (every 3 days) note site. Remove old patch before applying new patch  . HYDROcodone-acetaminophen (HYCET) 7.5-325 mg/15 ml solution Give 20ml by mouth every 6 hours as needed for pain. Max APAP 3gm/24 hours from all sources  . levothyroxine (SYNTHROID, LEVOTHROID) 25 MCG tablet Take 37.5 mcg by mouth daily before breakfast.   . loratadine (CLARITIN) 10 MG tablet Take 10 mg by mouth daily.  . memantine (NAMENDA) 5 MG tablet Take 5 mg by mouth daily.  . NON FORMULARY Diet Type:  Upgrade to regular textures and continue with thin liquids  Magic cup give once a  day  . Nutritional Supplements (ENSURE ENLIVE PO) One by mouth daily - Strawberry if available    . petrolatum-hydrophilic-aloe vera (ALOE VESTA) ointment Apply to sacrum and bilateral buttocks every shift as needed for erythema after incontinence and for prevention  . senna-docusate (SENOKOT-S) 8.6-50 MG per tablet Take 1 tablet by mouth at bedtime.   No facility-administered encounter medications on file as of 09/21/2018.      SIGNIFICANT DIAGNOSTIC EXAMS  LABS REVIEWED TODAY:   07-13-18: wbc 4.5; hgb 10.6; hct 34.3; mcv 100.6; plt 228; glucose 144; bun 24; creat 0.64; k+ 3.9; na++ 142; ca 8.9 vit B 12: 79;  08-09-18: tsh 5.143 08-17-18: vit B 12: 1046   Review of Systems  Unable to perform ROS: Dementia (unable to participate)    Physical Exam Constitutional:      General: She is not in acute distress.    Appearance: She is well-developed. She is not diaphoretic.     Comments: thin  Neck:     Musculoskeletal: Neck supple.     Thyroid: No thyromegaly.  Cardiovascular:     Rate and Rhythm: Normal rate and regular rhythm.     Pulses: Normal pulses.     Heart sounds: Normal heart sounds.  Pulmonary:     Effort: Pulmonary effort is normal. No respiratory distress.     Breath sounds: Normal breath sounds.  Abdominal:     General: Bowel sounds are normal. There is no distension.     Palpations: Abdomen is soft.     Tenderness: There is no abdominal tenderness.  Musculoskeletal:     Right lower leg: No edema.     Left lower leg: No edema.     Comments: Does not move lower extremities Bilateral upper extremities stiff Bilateral foot drop  History of right hip arthroplasty 2015  Lymphadenopathy:     Cervical: No cervical adenopathy.  Skin:    General: Skin is warm and dry.  Neurological:     Mental Status: She is alert. Mental status is at baseline.  Psychiatric:        Mood and Affect: Mood normal.      ASSESSMENT/ PLAN:  TODAY:   1. Acquired hypothyroidism: is stable tsh 5.143; will continue synthroid 36.5 mcg daily   2. Vascular dementia without behavioral  disturbance: is without change: weight is 121; pounds; will continue namenda 5 mg daily   3.  Chronic constipation: is stable will continue senna s nightly   4. Chronic pain syndrome is status past bilateral femoral neck fractures: no indications of pain present: will continue duragesic 12 mcg patch every 3 days; hycet 7.5/325 mg/15 ml every 6 hours as needed for break through pain  5. Chronic depression: is stable will continue celexa  20 mg daily   6. Protein calorie malnutrition, moderate: is without change weight is 121 pounds; will continue supplements as directed   7. Chronic anemia: is stable hgb 10.7   MD is aware of resident's narcotic use and is in agreement with current plan of care. We will attempt to wean resident as apropriate   Ok Edwards NP [facility]  Contact [phone] Monday through Friday 8am- 5pm  After hours call [phone]

## 2018-09-22 ENCOUNTER — Other Ambulatory Visit: Payer: Self-pay | Admitting: Adult Health

## 2018-09-22 MED ORDER — FENTANYL 12 MCG/HR TD PT72: MEDICATED_PATCH | TRANSDERMAL | 0 refills | Status: DC

## 2018-09-25 DIAGNOSIS — G894 Chronic pain syndrome: Secondary | ICD-10-CM | POA: Insufficient documentation

## 2018-09-25 DIAGNOSIS — D649 Anemia, unspecified: Secondary | ICD-10-CM | POA: Insufficient documentation

## 2018-09-25 DIAGNOSIS — K5909 Other constipation: Secondary | ICD-10-CM | POA: Insufficient documentation

## 2018-09-25 DIAGNOSIS — E440 Moderate protein-calorie malnutrition: Secondary | ICD-10-CM | POA: Insufficient documentation

## 2018-10-21 ENCOUNTER — Other Ambulatory Visit (HOSPITAL_COMMUNITY)
Admission: AD | Admit: 2018-10-21 | Discharge: 2018-10-21 | Disposition: A | Discharge status: Home / Self Care | Payer: Medicare Other | Source: Skilled Nursing Facility | Attending: Internal Medicine | Admitting: Internal Medicine

## 2018-10-21 ENCOUNTER — Encounter: Payer: Self-pay | Admitting: Internal Medicine

## 2018-10-21 ENCOUNTER — Non-Acute Institutional Stay (SKILLED_NURSING_FACILITY): Payer: Medicare Other | Admitting: Internal Medicine

## 2018-10-21 DIAGNOSIS — J9811 Atelectasis: Secondary | ICD-10-CM | POA: Diagnosis not present

## 2018-10-21 DIAGNOSIS — E039 Hypothyroidism, unspecified: Secondary | ICD-10-CM

## 2018-10-21 DIAGNOSIS — R509 Fever, unspecified: Secondary | ICD-10-CM | POA: Diagnosis not present

## 2018-10-21 DIAGNOSIS — N3000 Acute cystitis without hematuria: Secondary | ICD-10-CM

## 2018-10-21 DIAGNOSIS — F0150 Vascular dementia without behavioral disturbance: Secondary | ICD-10-CM | POA: Diagnosis not present

## 2018-10-21 LAB — URINALYSIS, COMPLETE (UACMP) WITH MICROSCOPIC
Bilirubin Urine: NEGATIVE
Glucose, UA: NEGATIVE mg/dL
Hgb urine dipstick: NEGATIVE
KETONES UR: 80 mg/dL — AB
Nitrite: POSITIVE — AB
PROTEIN: 30 mg/dL — AB
Specific Gravity, Urine: 1.025 (ref 1.005–1.030)
WBC, UA: >50 WBC/hpf — ABNORMAL HIGH (ref 0–5)
pH: 7 (ref 5.0–8.0)

## 2018-10-21 LAB — CBC WITH DIFFERENTIAL/PLATELET
Abs Immature Granulocytes: 0.03 10*3/uL (ref 0.00–0.07)
Basophils Absolute: 0 10*3/uL (ref 0.0–0.1)
Basophils Relative: 0 %
Eosinophils Absolute: 0 10*3/uL (ref 0.0–0.5)
Eosinophils Relative: 0 %
HCT: 36.9 % (ref 36.0–46.0)
Hemoglobin: 11.3 g/dL — ABNORMAL LOW (ref 12.0–15.0)
Immature Granulocytes: 0 %
Lymphocytes Relative: 15 %
Lymphs Abs: 1.4 10*3/uL (ref 0.7–4.0)
MCH: 30.4 pg (ref 26.0–34.0)
MCHC: 30.6 g/dL (ref 30.0–36.0)
MCV: 99.2 fL (ref 80.0–100.0)
MONOS PCT: 14 %
Monocytes Absolute: 1.3 10*3/uL — ABNORMAL HIGH (ref 0.1–1.0)
Neutro Abs: 6.6 10*3/uL (ref 1.7–7.7)
Neutrophils Relative %: 71 %
Platelets: 276 10*3/uL (ref 150–400)
RBC: 3.72 MIL/uL — ABNORMAL LOW (ref 3.87–5.11)
RDW: 13.2 % (ref 11.5–15.5)
WBC: 9.3 10*3/uL (ref 4.0–10.5)
nRBC: 0 % (ref 0.0–0.2)

## 2018-10-21 LAB — BASIC METABOLIC PANEL
Anion gap: 11 (ref 5–15)
BUN: 29 mg/dL — ABNORMAL HIGH (ref 8–23)
CO2: 21 mmol/L — ABNORMAL LOW (ref 22–32)
Calcium: 8.8 mg/dL — ABNORMAL LOW (ref 8.9–10.3)
Chloride: 114 mmol/L — ABNORMAL HIGH (ref 98–111)
Creatinine, Ser: 0.84 mg/dL (ref 0.44–1.00)
GFR calc Af Amer: >60 mL/min (ref >60)
GFR calc non Af Amer: >60 mL/min (ref >60)
Glucose, Bld: 89 mg/dL (ref 70–99)
Potassium: 4.1 mmol/L (ref 3.5–5.1)
Sodium: 146 mmol/L — ABNORMAL HIGH (ref 135–145)

## 2018-10-21 LAB — INFLUENZA PANEL BY PCR (TYPE A & B)
INFLBPCR: NEGATIVE
Influenza A By PCR: NEGATIVE

## 2018-10-21 NOTE — Progress Notes (Signed)
Location:  [place] Room Number: [place] of Service:  SNF [phone]) Provider:  Veleta Miners, MD  Virgie Dad, MD  Patient Care Team: Virgie Dad, MD as PCP - General (Internal Medicine)  Extended Emergency Contact Information Primary Emergency Contact: Noreene Larsson of [place] Phone: [phone] Mobile Phone: [phone] Relation: Niece Secondary Emergency Contact: Harvie Bridge [place] of [place] Phone: [phone] Mobile Phone: [phone] Relation: Sister  Code Status:  DNR Goals of care: Advanced Directive information Advanced Directives 09/21/2018  Does Patient Have a Medical Advance Directive? Yes  Type of Advance Directive Out of facility DNR (pink MOST or yellow form)  Does patient want to make changes to medical advance directive? No - Patient declined  Copy of [place] in Chart? -  Would patient like information on creating a medical advance directive? No - Patient declined  Pre-existing out of facility DNR order (yellow form or pink MOST form) -     Chief Complaint  Patient presents with  . Medical Management of Chronic Issues    Hypertension, Hypothyroidism    HPI:  Pt is a 85 y.o. female seen today for medical management of chronic diseases.    Patient has h/o Advanced dementia and Osteoporosis . She Had Sustained Right hip fracture which was followed by Left Hip fracture due to Fall in 07/16. Family decided not to get surgical repair. She has been here for  Pain management in Facility. Patient was seen today for Routine visit. But she was hot so we checked her Temp and it was 100.4 . She also was tachycardic. No history can be obtained from patient .She looks more lethargic and less responsive. Her weight is also down also from 119 lbs. She is down 6 lbs in past 4 months.  Past Medical History:  Diagnosis Date  . Dementia ([place])   . Osteoarthritis    Past Surgical  History:  Procedure Laterality Date  . BREAST SURGERY    . EYE SURGERY    . HIP ARTHROPLASTY Right 01/01/2014   Procedure: BIPOLAR ARTHROPLASTY RIGHT HIP;  Surgeon: Carole Civil, MD;  Location: [facility];  Service: Orthopedics;  Laterality: Right;    Allergies  Allergen Reactions  . Morphine And Related     Increased confusion    Outpatient Encounter Medications as of 10/21/2018  Medication Sig  . acetaminophen (TYLENOL) 325 MG suppository Place 325 mg rectally every 4 (four) hours as needed.  Marland Kitchen acetaminophen (TYLENOL) 500 MG tablet Give 1-2 tablets by mouth every 6 hours as needed for mild to moderate pain. DNE 3000mg  Tylenol in 24 hours from all sources.  . calcium-vitamin D (OSCAL WITH D) 500-200 MG-UNIT tablet Take 1 tablet by mouth 2 (two) times daily.  . citalopram (CELEXA) 10 MG tablet Take 20 mg by mouth daily.   Marland Kitchen Dextromethorphan-guaiFENesin 5-100 MG/5ML LIQD Take 15 mLs by mouth every 6 (six) hours as needed. Starting 09/06/2018   . fentaNYL (DURAGESIC) 12 MCG/HR Apply one patch topically as directed every 72 hours (every 3 days) note site. Remove old patch before applying new patch  . levothyroxine (SYNTHROID, LEVOTHROID) 25 MCG tablet Take 37.5 mcg by mouth daily before breakfast.   . loratadine (CLARITIN) 10 MG tablet Take 10 mg by mouth daily.  . memantine (NAMENDA) 5 MG tablet Take 5 mg by mouth daily.  . NON FORMULARY Diet Type:  Upgrade to regular textures and continue with thin liquids  Magic cup give once a  day  . Nutritional Supplements (ENSURE ENLIVE PO) One by mouth daily - Strawberry if available  . petrolatum-hydrophilic-aloe vera (ALOE VESTA) ointment Apply to sacrum and bilateral buttocks every shift as needed for erythema after incontinence and for prevention  . senna-docusate (SENOKOT-S) 8.6-50 MG per tablet Take 1 tablet by mouth at bedtime.  . [DISCONTINUED] acetaminophen (TYLENOL) 500 MG tablet Take 500 mg by mouth every 6 (six) hours as needed.  .  [DISCONTINUED] HYDROcodone-acetaminophen (HYCET) 7.5-325 mg/15 ml solution Give 71ml by mouth every 6 hours as needed for pain. Max APAP 3gm/24 hours from all sources (Patient not taking: Reported on 10/21/2018)   No facility-administered encounter medications on file as of 10/21/2018.     Review of Systems  Unable to perform ROS: Dementia    Immunization History  Administered Date(s) Administered  . Influenza-Unspecified 05/10/2014, 05/02/2016, 05/08/2017, 05/06/2018  . Pneumococcal Polysaccharide-23 01/02/2014  . Pneumococcal-Unspecified 05/28/2016  . Tdap 01/01/2014, 04/16/2017   Pertinent  Health Maintenance Due  Topic Date Due  . INFLUENZA VACCINE  Completed  . DEXA SCAN  Completed  . PNA vac Low Risk Adult  Completed   Fall Risk  05/03/2018 04/23/2017  Falls in the past year? No No   Functional Status Survey:    Vitals:   10/21/18 1122  BP: (!) 102/55  Pulse: (!) 54  Resp: 16  Temp: 98.2 F (36.8 C)  Weight: 119 lb 9.6 oz (54.3 kg)  Height: 5\' 3"  (1.6 m)   Body mass index is 21.19 kg/m. Physical Exam Vitals signs reviewed.  Constitutional:      Appearance: Normal appearance.  HENT:     Head: Normocephalic.     Nose: Nose normal.     Mouth/Throat:     Mouth: Mucous membranes are moist.     Comments: Had food in his mouth Eyes:     Pupils: Pupils are equal, round, and reactive to light.  Neck:     Musculoskeletal: Neck supple.  Cardiovascular:     Rate and Rhythm: Tachycardia present.     Pulses: Normal pulses.     Heart sounds: Normal heart sounds.  Pulmonary:     Effort: Pulmonary effort is normal. No respiratory distress.     Breath sounds: Normal breath sounds. No wheezing.  Abdominal:     General: Abdomen is flat. Bowel sounds are normal. There is no distension.     Palpations: Abdomen is soft.     Tenderness: There is no abdominal tenderness.  Musculoskeletal:        General: No swelling.  Skin:    General: Skin is warm.  Neurological:      Mental Status: She is alert.     Comments: Patient has advanced Dementia. Usually she responds and today she is lethargic and not responding.  Psychiatric:        Mood and Affect: Mood normal.        Behavior: Behavior normal.     Labs reviewed: Recent Labs    02/09/18 0721 05/19/18 0700 07/13/18 0700  NA 141 139 142  K 4.2 4.3 3.9  CL 107 108 112*  CO2 28 23 24   GLUCOSE 95 102* 144*  BUN 23 25* 29*  CREATININE 0.70 0.70 0.74  CALCIUM 8.8* 8.6* 8.9   Recent Labs    02/09/18 0721  AST 16  ALT 13  ALKPHOS 49  BILITOT 0.6  PROT 6.0*  ALBUMIN 3.1*   Recent Labs  11/17/17 0800 02/09/18 0721 05/19/18 0700 07/13/18 0700  WBC 4.6 4.9 4.1 4.5  NEUTROABS 2.0 2.4 2.0  --   HGB 11.4* 11.0* 10.4* 10.6*  HCT 36.5 34.7* 34.1* 34.3*  32.6*  MCV 98.1 99.1 100.9* 100.6*  PLT 163 182 212 165   Lab Results  Component Value Date   TSH 5.143 (H) 08/09/2018   No results found for: HGBA1C No results found for: CHOL, HDL, LDLCALC, LDLDIRECT, TRIG, CHOLHDL  Significant Diagnostic Results in last 30 days:  No results found.  Assessment/Plan  Fever Will get Stat Chest Xray UA, CBC And CMP FLU swab  Hypothyroidism TSH was mildily elevated Will continue same dose of Synthyroid Repeat TSH in 12 weeks Dementia with behavioral disturbances On Namenda and Celexa Patient is stable   Essential hypertension Not on any meds  Senile osteoporosis Comfort care pain control with fentanyl Anemia Her B12 was low and she was supplemented Will start her on Lower dose now that her B12 is normal Mycotic toenails Is followed by podiatry  Addendum Chest Xray is negative for any acute process CBC shows no leucocytosis Flu Swab is Negative Patient is now in Isolation. Awaiting UA   Note for 03/20  UA looks infected. Patient has not spiked temp for past 24 hours. Will start on Levaquin 500 mg for 7 days. Continue to push for Oral Hydration. Keep in isolation till stays  afebrile for another day     Family/ staff Communication:   Labs/tests ordered:   Total time spent in this patient care encounter was 45_ minutes; greater than 50% of the visit spent counseling patient, reviewing records , Labs and coordinating care for problems addressed at this encounter.

## 2018-10-22 ENCOUNTER — Non-Acute Institutional Stay (SKILLED_NURSING_FACILITY): Payer: Medicare Other | Admitting: Adult Health

## 2018-10-22 ENCOUNTER — Encounter (HOSPITAL_COMMUNITY)
Admission: AD | Admit: 2018-10-22 | Discharge: 2018-10-22 | Disposition: A | Discharge status: Home / Self Care | Payer: Medicare Other | Source: Skilled Nursing Facility | Attending: Internal Medicine | Admitting: Internal Medicine

## 2018-10-22 ENCOUNTER — Encounter: Payer: Self-pay | Admitting: Adult Health

## 2018-10-22 DIAGNOSIS — M810 Age-related osteoporosis without current pathological fracture: Secondary | ICD-10-CM | POA: Insufficient documentation

## 2018-10-22 DIAGNOSIS — N390 Urinary tract infection, site not specified: Secondary | ICD-10-CM

## 2018-10-22 DIAGNOSIS — M1990 Unspecified osteoarthritis, unspecified site: Secondary | ICD-10-CM | POA: Insufficient documentation

## 2018-10-22 DIAGNOSIS — R509 Fever, unspecified: Secondary | ICD-10-CM | POA: Insufficient documentation

## 2018-10-22 DIAGNOSIS — F0390 Unspecified dementia without behavioral disturbance: Secondary | ICD-10-CM | POA: Insufficient documentation

## 2018-10-22 DIAGNOSIS — Z8781 Personal history of (healed) traumatic fracture: Secondary | ICD-10-CM | POA: Insufficient documentation

## 2018-10-22 NOTE — Progress Notes (Signed)
Location:   [place] Room Number: [place] of Service:  SNF (31)   CODE STATUS: DNR  Allergies  Allergen Reactions  . Morphine And Related     Increased confusion    Chief Complaint  Patient presents with  . Acute Visit    HPI:  She had a low grade fever; her cbc did not demonstrate infection. There are no reports of changes in appetite. No indications of pain present. Her UA was positive and has been started on levaquin.   Past Medical History:  Diagnosis Date  . Dementia ([place])   . Osteoarthritis     Past Surgical History:  Procedure Laterality Date  . BREAST SURGERY    . EYE SURGERY    . HIP ARTHROPLASTY Right 01/01/2014   Procedure: BIPOLAR ARTHROPLASTY RIGHT HIP;  Surgeon: Carole Civil, MD;  Location: [facility];  Service: Orthopedics;  Laterality: Right;    Social History   Socioeconomic History  . Marital status: Widowed    Spouse name: Not on file  . Number of children: 0  . Years of education: 7th  . Highest education level: Not on file  Occupational History    Employer: RETIRED  Social Needs  . Financial resource strain: Not hard at all  . Food insecurity:    Worry: Never true    Inability: Never true  . Transportation needs:    Medical: No    Non-medical: No  Tobacco Use  . Smoking status: Never Smoker  . Smokeless tobacco: Never Used  Substance and Sexual Activity  . Alcohol use: No  . Drug use: No  . Sexual activity: Never  Lifestyle  . Physical activity:    Days per week: 0 days    Minutes per session: 0 min  . Stress: Not at all  Relationships  . Social connections:    Talks on phone: Never    Gets together: Once a week    Attends religious service: Never    Active member of club or organization: No    Attends meetings of clubs or organizations: Never    Relationship status: Widowed  . Intimate partner violence:    Fear of current or ex partner: No    Emotionally abused: No    Physically  abused: No    Forced sexual activity: No  Other Topics Concern  . Not on file  Social History Narrative  . Not on file   History reviewed. No pertinent family history.    VITAL SIGNS BP 110/62   Pulse 90   Temp 98.7 F (37.1 C)   Resp 20   Ht 5\' 3"  (1.6 m)   Wt 119 lb 9.6 oz (54.3 kg)   BMI 21.19 kg/m   Outpatient Encounter Medications as of 10/22/2018  Medication Sig  . acetaminophen (TYLENOL) 325 MG suppository Place 325 mg rectally every 4 (four) hours as needed. Per Standing order for fever / pain - do not exceed 3g of APAP / 24 hours considering all sources  . acetaminophen (TYLENOL) 500 MG tablet Give 1-2 tablets by mouth every 6 hours as needed for mild to moderate pain. DNE 3000mg  Tylenol in 24 hours from all sources.  . calcium-vitamin D (OSCAL WITH D) 500-200 MG-UNIT tablet Take 1 tablet by mouth 2 (two) times daily.  . citalopram (CELEXA) 10 MG tablet Take 20 mg by mouth daily.   . fentaNYL (DURAGESIC) 12 MCG/HR Apply one patch topically as directed every 72  hours (every 3 days) note site. Remove old patch before applying new patch  . levothyroxine (SYNTHROID, LEVOTHROID) 25 MCG tablet Take 37.5 mcg by mouth daily before breakfast.   . loratadine (CLARITIN) 10 MG tablet Take 10 mg by mouth daily.  . memantine (NAMENDA) 5 MG tablet Take 5 mg by mouth daily.  . NON FORMULARY Diet Type:  Upgrade to regular textures and continue with thin liquids  Magic cup give once a  day  . Nutritional Supplements (ENSURE ENLIVE PO) One by mouth daily - Strawberry if available  . petrolatum-hydrophilic-aloe vera (ALOE VESTA) ointment Apply to sacrum and bilateral buttocks every shift as needed for erythema after incontinence and for prevention  . senna-docusate (SENOKOT-S) 8.6-50 MG per tablet Take 1 tablet by mouth at bedtime.  . [DISCONTINUED] acetaminophen (TYLENOL) 325 MG suppository Place 325 mg rectally every 4 (four) hours as needed.  . [DISCONTINUED]  Dextromethorphan-guaiFENesin 5-100 MG/5ML LIQD Take 5 mLs by mouth every 6 (six) hours as needed.    No facility-administered encounter medications on file as of 10/22/2018.      SIGNIFICANT DIAGNOSTIC EXAMS  LABS REVIEWED PREVIOUS :   07-13-18: wbc 4.5; hgb 10.6; hct 34.3; mcv 100.6; plt 228; glucose 144; bun 24; creat 0.64; k+ 3.9; na++ 142; ca 8.9 vit B 12: 79;  08-09-18: tsh 5.143 08-17-18: vit B 12: 1046   TODAY:   10-21-18: wbc 9.3; hgb 11.3; hct 36.9; mcv 99.2 plt 276; glucose 89; bun 29; creat 0.84; k+ 4.1; na++ 146; ca 8.8  Flu neg; ua: positive     Review of Systems  Unable to perform ROS: Dementia (unable to participate )    Physical Exam Constitutional:      General: She is not in acute distress.    Appearance: She is well-developed. She is not diaphoretic.     Comments: thin  Neck:     Thyroid: No thyromegaly.  Cardiovascular:     Rate and Rhythm: Normal rate and regular rhythm.     Heart sounds: Normal heart sounds.  Pulmonary:     Effort: Pulmonary effort is normal. No respiratory distress.     Breath sounds: Normal breath sounds.  Abdominal:     General: Bowel sounds are normal. There is no distension.     Palpations: Abdomen is soft.     Tenderness: There is no abdominal tenderness.  Musculoskeletal:     Right lower leg: No edema.     Left lower leg: No edema.     Comments: Does not move lower extremities Bilateral upper extremities stiff Bilateral foot drop  History of right hip arthroplasty 2015   Lymphadenopathy:     Cervical: No cervical adenopathy.  Skin:    General: Skin is warm and dry.  Neurological:     Mental Status: She is alert. Mental status is at baseline.  Psychiatric:        Mood and Affect: Mood normal.      ASSESSMENT/ PLAN:  TODAY:   1. Acute UTI: will complete levaquin 500 mg daily; will hold celexa while on levaquin   MD is aware of resident's narcotic use and is in agreement with current plan of care. We will attempt  to wean resident as apropriate   Ok Edwards NP [facility]  Contact [phone] Monday through Friday 8am- 5pm  After hours call [phone]

## 2018-10-25 ENCOUNTER — Encounter: Payer: Self-pay | Admitting: Adult Health

## 2018-10-25 ENCOUNTER — Other Ambulatory Visit: Payer: Self-pay | Admitting: Adult Health

## 2018-10-25 MED ORDER — FENTANYL 12 MCG/HR TD PT72: MEDICATED_PATCH | TRANSDERMAL | 0 refills | Status: DC

## 2018-10-25 NOTE — Progress Notes (Signed)
Location:   [facility] Room Number: [place] of Service:  SNF ([phone])   CODE STATUS: DNR  Allergies  Allergen Reactions  . Morphine And Related     Increased confusion    Chief Complaint  Patient presents with  . Acute Visit    Isolation    HPI:    Past Medical History:  Diagnosis Date  . Dementia ([place])   . Osteoarthritis     Past Surgical History:  Procedure Laterality Date  . BREAST SURGERY    . EYE SURGERY    . HIP ARTHROPLASTY Right 01/01/2014   Procedure: BIPOLAR ARTHROPLASTY RIGHT HIP;  Surgeon: Carole Civil, MD;  Location: [facility];  Service: Orthopedics;  Laterality: Right;    Social History   Socioeconomic History  . Marital status: Widowed    Spouse name: Not on file  . Number of children: 0  . Years of education: 7th  . Highest education level: Not on file  Occupational History    Employer: RETIRED  Social Needs  . Financial resource strain: Not hard at all  . Food insecurity:    Worry: Never true    Inability: Never true  . Transportation needs:    Medical: No    Non-medical: No  Tobacco Use  . Smoking status: Never Smoker  . Smokeless tobacco: Never Used  Substance and Sexual Activity  . Alcohol use: No  . Drug use: No  . Sexual activity: Never  Lifestyle  . Physical activity:    Days per week: 0 days    Minutes per session: 0 min  . Stress: Not at all  Relationships  . Social connections:    Talks on phone: Never    Gets together: Once a week    Attends religious service: Never    Active member of club or organization: No    Attends meetings of clubs or organizations: Never    Relationship status: Widowed  . Intimate partner violence:    Fear of current or ex partner: No    Emotionally abused: No    Physically abused: No    Forced sexual activity: No  Other Topics Concern  . Not on file  Social History Narrative  . Not on file   History reviewed. No pertinent family history.     VITAL SIGNS BP 128/72   Pulse 78   Temp 97.7 F (36.5 C)   Resp 18   Ht 5\' 3"  (1.6 m)   Wt 119 lb 9.6 oz (54.3 kg)   BMI 21.19 kg/m   Outpatient Encounter Medications as of 10/25/2018  Medication Sig  . acetaminophen (TYLENOL) 325 MG suppository Place 325 mg rectally every 4 (four) hours as needed. Per Standing order for fever / pain - do not exceed 3g of APAP / 24 hours considering all sources  . acetaminophen (TYLENOL) 500 MG tablet Give 1-2 tablets by mouth every 6 hours as needed for mild to moderate pain. DNE 3000mg  Tylenol in 24 hours from all sources.  . calcium-vitamin D (OSCAL WITH D) 500-200 MG-UNIT tablet Take 1 tablet by mouth 2 (two) times daily.  . citalopram (CELEXA) 10 MG tablet Take 20 mg by mouth daily.   Marland Kitchen Dextromethorphan-guaiFENesin 5-100 MG/5ML LIQD Take 5 mLs by mouth every 6 (six) hours as needed.  . fentaNYL (DURAGESIC) 12 MCG/HR Apply one patch topically as directed every 72 hours (every 3 days) note site. Remove old patch before applying new patch  .  levothyroxine (SYNTHROID, LEVOTHROID) 25 MCG tablet Take 37.5 mcg by mouth daily before breakfast.   . loratadine (CLARITIN) 10 MG tablet Take 10 mg by mouth daily.  . memantine (NAMENDA) 5 MG tablet Take 5 mg by mouth daily.  . NON FORMULARY Diet Type:  Upgrade to regular textures and continue with thin liquids  Magic cup give once a  day  . Nutritional Supplements (ENSURE ENLIVE PO) One by mouth daily - Strawberry if available  . petrolatum-hydrophilic-aloe vera (ALOE VESTA) ointment Apply to sacrum and bilateral buttocks every shift as needed for erythema after incontinence and for prevention  . Probiotic Product (PROBIOTIC DAILY) CAPS Take 1 tablet by mouth 3 (three) times daily.  Marland Kitchen senna-docusate (SENOKOT-S) 8.6-50 MG per tablet Take 1 tablet by mouth at bedtime.   No facility-administered encounter medications on file as of 10/25/2018.      SIGNIFICANT DIAGNOSTIC EXAMS       ASSESSMENT/ PLAN:     MD is aware of resident's narcotic use and is in agreement with current plan of care. We will attempt to wean resident as apropriate   Ok Edwards NP [facility]  Contact [phone] Monday through Friday 8am- 5pm  After hours call [phone]

## 2018-10-31 NOTE — Progress Notes (Signed)
This encounter was created in error - please disregard.

## 2018-11-01 ENCOUNTER — Non-Acute Institutional Stay (SKILLED_NURSING_FACILITY): Payer: Medicare Other | Admitting: Internal Medicine

## 2018-11-01 ENCOUNTER — Encounter: Payer: Self-pay | Admitting: Internal Medicine

## 2018-11-01 DIAGNOSIS — R627 Adult failure to thrive: Secondary | ICD-10-CM | POA: Diagnosis not present

## 2018-11-01 DIAGNOSIS — E039 Hypothyroidism, unspecified: Secondary | ICD-10-CM | POA: Diagnosis not present

## 2018-11-01 DIAGNOSIS — F0150 Vascular dementia without behavioral disturbance: Secondary | ICD-10-CM

## 2018-11-01 DIAGNOSIS — G894 Chronic pain syndrome: Secondary | ICD-10-CM

## 2018-11-01 NOTE — Progress Notes (Signed)
Location:  [facility] Room Number: [place] of Service:  SNF [phone]) Provider:  Veleta Miners, MD  Virgie Dad, MD  Patient Care Team: Virgie Dad, MD as PCP - General (Internal Medicine)  Extended Emergency Contact Information Primary Emergency Contact: Noreene Larsson of [place] Phone: [phone] Mobile Phone: [phone] Relation: Niece Secondary Emergency Contact: Harvie Bridge States of [place] Phone: [phone] Mobile Phone: [phone] Relation: Sister  Code Status:  DNR Goals of care: Advanced Directive information Advanced Directives 11/01/2018  Does Patient Have a Medical Advance Directive? Yes  Type of Advance Directive Out of facility DNR (pink MOST or yellow form)  Does patient want to make changes to medical advance directive? No - Patient declined  Copy of [place] in Chart? -  Would patient like information on creating a medical advance directive? No - Patient declined  Pre-existing out of facility DNR order (yellow form or pink MOST form) Yellow form placed in chart (order not valid for inpatient use);Pink MOST form placed in chart (order not valid for inpatient use)     Chief Complaint  Patient presents with   Acute Visit    End of Life    HPI:  Pt is a 85 y.o. female seen today for an acute visit for Patient not doing well.Not eating and refusing all her Meds Patient has h/o Advanced dementia and Osteoporosis . She Had Sustained Right hip fracture which was followed by Left Hip fracture due to Fall in 07/16. Family decided not to get surgical repair. She has been here for  Pain management in Facility since then  For Past few weeks Nurses have noticed decrease in her appetite.  She is eating 25% of her meals.  When I had seen her a week ago we had diagnosed her with UTI and treated her with antibiotics due to her low-grade temp.  Her chest x-ray was negative at that time.   She had gotten better after that But now  per nurses patient continues to refuse medicines now.  She also continues to look like she is choking on her food.  She is afebrile Opens her eyes and responds.  But does not follow any commands.  She has advanced dementia and is unable to give any history.  Patient also has lost weight almost 6 pounds in past 3 months.   Past Medical History:  Diagnosis Date   Dementia [place])    Osteoarthritis    Past Surgical History:  Procedure Laterality Date   BREAST SURGERY     EYE SURGERY     HIP ARTHROPLASTY Right 01/01/2014   Procedure: BIPOLAR ARTHROPLASTY RIGHT HIP;  Surgeon: Carole Civil, MD;  Location: [facility];  Service: Orthopedics;  Laterality: Right;    Allergies  Allergen Reactions   Morphine And Related     Increased confusion    Outpatient Encounter Medications as of 11/01/2018  Medication Sig   acetaminophen (TYLENOL) 325 MG suppository Place 325 mg rectally every 4 (four) hours as needed. Per Standing order for fever / pain - do not exceed 3g of APAP / 24 hours considering all sources   acetaminophen (TYLENOL) 500 MG tablet Give 1-2 tablets by mouth every 6 hours as needed for mild to moderate pain. DNE 3000mg  Tylenol in 24 hours from all sources.   calcium-vitamin D (OSCAL WITH D) 500-200 MG-UNIT tablet Take 1 tablet by mouth 2 (two) times daily.   citalopram (CELEXA)  10 MG tablet Take 20 mg by mouth daily.    Dextromethorphan-guaiFENesin 5-100 MG/5ML LIQD Take 5 mLs by mouth every 6 (six) hours as needed.   fentaNYL (DURAGESIC) 12 MCG/HR Apply one patch topically as directed every 72 hours (every 3 days) note site. Remove old patch before applying new patch   levothyroxine (SYNTHROID, LEVOTHROID) 25 MCG tablet Take 37.5 mcg by mouth daily before breakfast.    loratadine (CLARITIN) 10 MG tablet Take 10 mg by mouth daily.   memantine (NAMENDA) 5 MG tablet Take 5 mg by mouth daily.   NON FORMULARY Diet Type:  Upgrade  to regular textures and continue with thin liquids  Magic cup give once a  day   Nutritional Supplements (ENSURE ENLIVE PO) One by mouth daily - Strawberry if available   petrolatum-hydrophilic-aloe vera (ALOE VESTA) ointment Apply to sacrum and bilateral buttocks every shift as needed for erythema after incontinence and for prevention   Probiotic Product (PROBIOTIC DAILY) CAPS Take 1 tablet by mouth 3 (three) times daily.   senna-docusate (SENOKOT-S) 8.6-50 MG per tablet Take 1 tablet by mouth at bedtime.   No facility-administered encounter medications on file as of 11/01/2018.     Review of Systems  Unable to perform ROS: Dementia    Immunization History  Administered Date(s) Administered   Influenza-Unspecified 05/10/2014, 05/02/2016, 05/08/2017, 05/06/2018   Pneumococcal Polysaccharide-23 01/02/2014   Pneumococcal-Unspecified 05/28/2016   Tdap 01/01/2014, 04/16/2017   Pertinent  Health Maintenance Due  Topic Date Due   INFLUENZA VACCINE  Completed   DEXA SCAN  Completed   PNA vac Low Risk Adult  Completed   Fall Risk  05/03/2018 04/23/2017  Falls in the past year? No No   Functional Status Survey:    Vitals:   11/01/18 1142  BP: 119/77  Pulse: 86  Resp: 20  Temp: 97.8 F (36.6 C)  Weight: 119 lb 9.6 oz (54.3 kg)  Height: 5\' 3"  (1.6 m)   Body mass index is 21.19 kg/m. Physical Exam Vitals signs reviewed.  Constitutional:      Appearance: Normal appearance.  HENT:     Head: Normocephalic.     Nose: Nose normal.     Mouth/Throat:     Comments: Would Not Open her Mouth Eyes:     Pupils: Pupils are equal, round, and reactive to light.  Neck:     Musculoskeletal: Neck supple.  Cardiovascular:     Rate and Rhythm: Normal rate and regular rhythm.  Pulmonary:     Effort: Pulmonary effort is normal.     Breath sounds: Normal breath sounds.  Abdominal:     General: Abdomen is flat. Bowel sounds are normal.     Palpations: Abdomen is soft.    Musculoskeletal:        General: No swelling.  Skin:    General: Skin is warm and dry.  Neurological:     Mental Status: She is alert.     Comments: Not oriented. Does not follow any commands  Psychiatric:        Mood and Affect: Mood normal.        Thought Content: Thought content normal.     Labs reviewed: Recent Labs    05/19/18 0700 07/13/18 0700 10/21/18 1800  NA 139 142 146*  K 4.3 3.9 4.1  CL 108 112* 114*  CO2 23 24 21*  GLUCOSE 102* 144* 89  BUN 25* 29* 29*  CREATININE 0.70 0.74 0.84  CALCIUM 8.6* 8.9 8.8*  Recent Labs    02/09/18 0721  AST 16  ALT 13  ALKPHOS 49  BILITOT 0.6  PROT 6.0*  ALBUMIN 3.1*   Recent Labs    02/09/18 0721 05/19/18 0700 07/13/18 0700 10/21/18 1800  WBC 4.9 4.1 4.5 9.3  NEUTROABS 2.4 2.0  --  6.6  HGB 11.0* 10.4* 10.6* 11.3*  HCT 34.7* 34.1* 34.3*   32.6* 36.9  MCV 99.1 100.9* 100.6* 99.2  PLT 182 212 165 276   Lab Results  Component Value Date   TSH 5.143 (H) 08/09/2018   No results found for: HGBA1C No results found for: CHOL, HDL, LDLCALC, LDLDIRECT, TRIG, CHOLHDL  Significant Diagnostic Results in last 30 days:  No results found.  Assessment/Plan Advanced Dementia with now refusing Meds and decrased appetite D/W the Nurses Will discontinue her Celexa and Namenda and Vit D and claritin Will continue Fentanyl Patch for Pain and Synthroid for now Would not change her Diet and keep it regular as per family request I have also d/w the Education officer, museum for MOST form. She is DNR and has oders of no hospitaliztion We are informing family  of change in status   Family/ staff Communication:   Labs/tests ordered:   Total time spent in this patient care encounter was 25_ minutes; greater than 50% of the visit spent with Nurses, reviewing records , Labs and coordinating care for problems addressed at this encounter.

## 2018-11-02 DIAGNOSIS — R627 Adult failure to thrive: Secondary | ICD-10-CM | POA: Insufficient documentation

## 2018-11-05 ENCOUNTER — Other Ambulatory Visit: Payer: Self-pay | Admitting: Internal Medicine

## 2018-11-05 ENCOUNTER — Non-Acute Institutional Stay (SKILLED_NURSING_FACILITY): Payer: Medicare Other | Admitting: Internal Medicine

## 2018-11-05 ENCOUNTER — Encounter: Payer: Self-pay | Admitting: Internal Medicine

## 2018-11-05 DIAGNOSIS — M810 Age-related osteoporosis without current pathological fracture: Secondary | ICD-10-CM

## 2018-11-05 DIAGNOSIS — F0150 Vascular dementia without behavioral disturbance: Secondary | ICD-10-CM | POA: Diagnosis not present

## 2018-11-05 DIAGNOSIS — Z515 Encounter for palliative care: Secondary | ICD-10-CM | POA: Diagnosis not present

## 2018-11-05 MED ORDER — MORPHINE SULFATE (CONCENTRATE) 10 MG/0.5ML PO SOLN: 5.0000 mg | ORAL | Status: DC | PRN

## 2018-11-05 NOTE — Progress Notes (Signed)
.psc  Location:      Place of Service:    Provider:  Veleta Miners, MD  Virgie Dad, MD  Patient Care Team: Virgie Dad, MD as PCP - General (Internal Medicine)  Extended Emergency Contact Information Primary Emergency Contact: Noreene Larsson of [place] Phone: [phone] Mobile Phone: [phone] Relation: Niece Secondary Emergency Contact: Harvie Bridge States of [place] Phone: [phone] Mobile Phone: [phone] Relation: Sister  Code Status:  DNR Goals of care: Advanced Directive information Advanced Directives 11/01/2018  Does Patient Have a Medical Advance Directive? Yes  Type of Advance Directive Out of facility DNR (pink MOST or yellow form)  Does patient want to make changes to medical advance directive? No - Patient declined  Copy of [place] in Chart? -  Would patient like information on creating a medical advance directive? No - Patient declined  Pre-existing out of facility DNR order (yellow form or pink MOST form) Yellow form placed in chart (order not valid for inpatient use);Pink MOST form placed in chart (order not valid for inpatient use)     Chief Complaint  Patient presents with  . Acute Visit    HPI:  Pt is a 85 y.o. female seen today for an acute visit for end of Life Care  Patient has h/o Advanced dementia and Osteoporosis . She Had Sustained Right hip fracture which was followed by Left Hip fracture due to Fall in 07/16. Family decided not to get surgical repair. She has been here for  Pain management since then  For Past few weeks Nurses have noticed decrease in her appetite.  She was eating 25% of her meals.  But today patient is not eating at all . No Oral Intake.Not taking any of her Meds  Her family is allowed to visit her. I was able  to meet her 3 sisters. Patient is lethargic. Does not respond. Does open her eyes. Breathing is comfortable Per nurses she seems to be in pain  when they are performing her regular ADL   Past Medical History:  Diagnosis Date  . Dementia ([place])   . Osteoarthritis    Past Surgical History:  Procedure Laterality Date  . BREAST SURGERY    . EYE SURGERY    . HIP ARTHROPLASTY Right 01/01/2014   Procedure: BIPOLAR ARTHROPLASTY RIGHT HIP;  Surgeon: Carole Civil, MD;  Location: [facility];  Service: Orthopedics;  Laterality: Right;    Allergies  Allergen Reactions  . Morphine And Related     Increased confusion    No facility-administered encounter medications on file as of 11/05/2018.    Outpatient Encounter Medications as of 11/05/2018  Medication Sig  . acetaminophen (TYLENOL) 325 MG suppository Place 325 mg rectally every 4 (four) hours as needed. Per Standing order for fever / pain - do not exceed 3g of APAP / 24 hours considering all sources  . acetaminophen (TYLENOL) 500 MG tablet Give 1-2 tablets by mouth every 6 hours as needed for mild to moderate pain. DNE 3000mg  Tylenol in 24 hours from all sources.  . calcium-vitamin D (OSCAL WITH D) 500-200 MG-UNIT tablet Take 1 tablet by mouth 2 (two) times daily.  . citalopram (CELEXA) 10 MG tablet Take 20 mg by mouth daily.   Marland Kitchen Dextromethorphan-guaiFENesin 5-100 MG/5ML LIQD Take 5 mLs by mouth every 6 (six) hours as needed.  . fentaNYL (DURAGESIC) 12 MCG/HR Apply one patch topically as directed every 72 hours (every 3 days) note site.  Remove old patch before applying new patch  . levothyroxine (SYNTHROID, LEVOTHROID) 25 MCG tablet Take 37.5 mcg by mouth daily before breakfast.   . loratadine (CLARITIN) 10 MG tablet Take 10 mg by mouth daily.  . memantine (NAMENDA) 5 MG tablet Take 5 mg by mouth daily.  . NON FORMULARY Diet Type:  Upgrade to regular textures and continue with thin liquids  Magic cup give once a  day  . Nutritional Supplements (ENSURE ENLIVE PO) One by mouth daily - Strawberry if available  . petrolatum-hydrophilic-aloe vera (ALOE VESTA) ointment Apply to sacrum and  bilateral buttocks every shift as needed for erythema after incontinence and for prevention  . Probiotic Product (PROBIOTIC DAILY) CAPS Take 1 tablet by mouth 3 (three) times daily.  Marland Kitchen senna-docusate (SENOKOT-S) 8.6-50 MG per tablet Take 1 tablet by mouth at bedtime.    Review of Systems  Unable to perform ROS: Dementia    Immunization History  Administered Date(s) Administered  . Influenza-Unspecified 05/10/2014, 05/02/2016, 05/08/2017, 05/06/2018  . Pneumococcal Polysaccharide-23 01/02/2014  . Pneumococcal-Unspecified 05/28/2016  . Tdap 01/01/2014, 04/16/2017   Pertinent  Health Maintenance Due  Topic Date Due  . INFLUENZA VACCINE  03/05/2019  . DEXA SCAN  Completed  . PNA vac Low Risk Adult  Completed   Fall Risk  05/03/2018 04/23/2017  Falls in the past year? No No   Functional Status Survey:    Vitals:   11/05/18 2140  BP: 122/68  Pulse: (!) 102  Resp: 20  Temp: (!) 97.1 F (36.2 C)   There is no height or weight on file to calculate BMI. Physical Exam Vitals signs reviewed.  Constitutional:      Appearance: Normal appearance. She is ill-appearing.  HENT:     Head: Normocephalic.     Nose: Nose normal.     Mouth/Throat:     Comments: Would Not Open her Mouth Eyes:     Pupils: Pupils are equal, round, and reactive to light.  Neck:     Musculoskeletal: Neck supple.  Cardiovascular:     Rate and Rhythm: Normal rate and regular rhythm.  Pulmonary:     Effort: Pulmonary effort is normal.     Breath sounds: Normal breath sounds.  Abdominal:     General: Abdomen is flat. Bowel sounds are normal.     Palpations: Abdomen is soft.  Musculoskeletal:        General: No swelling.  Skin:    General: Skin is warm and dry.  Neurological:     Comments: Lethargic. Weak Would barely open her eyes  Psychiatric:        Mood and Affect: Mood normal.        Thought Content: Thought content normal.     Labs reviewed: Recent Labs    05/19/18 0700 07/13/18 0700  10/21/18 1800  NA 139 142 146*  K 4.3 3.9 4.1  CL 108 112* 114*  CO2 23 24 21*  GLUCOSE 102* 144* 89  BUN 25* 29* 29*  CREATININE 0.70 0.74 0.84  CALCIUM 8.6* 8.9 8.8*   Recent Labs    02/09/18 0721  AST 16  ALT 13  ALKPHOS 49  BILITOT 0.6  PROT 6.0*  ALBUMIN 3.1*   Recent Labs    02/09/18 0721 05/19/18 0700 07/13/18 0700 10/21/18 1800  WBC 4.9 4.1 4.5 9.3  NEUTROABS 2.4 2.0  --  6.6  HGB 11.0* 10.4* 10.6* 11.3*  HCT 34.7* 34.1* 34.3*  32.6* 36.9  MCV 99.1 100.9* 100.6*  99.2  PLT 182 212 165 276   Lab Results  Component Value Date   TSH 5.143 (H) 08/09/2018   No results found for: HGBA1C No results found for: CHOL, HDL, LDLCALC, LDLDIRECT, TRIG, CHOLHDL  Significant Diagnostic Results in last 30 days:  No results found.  Assessment/Plan Advanced Dementia with now end of Life Care D/W the Nurses Started on Roxanol 5 mg Q4 hour PRN for Pain  Fentanyl Patch discontinued Discontinue all her meds She is DNR and has oders of no hospitaliztion D/W the Family in room  Family/ staff Communication:   Labs/tests ordered:   Total time spent in this patient care encounter was 25_ minutes; greater than 50% of the visit spent with Nurses and Family, reviewing records , Labs and coordinating care for problems addressed at this encounter.

## 2018-11-08 ENCOUNTER — Other Ambulatory Visit: Payer: Self-pay | Admitting: Internal Medicine

## 2018-11-08 ENCOUNTER — Encounter: Payer: Self-pay | Admitting: Internal Medicine

## 2018-11-08 ENCOUNTER — Non-Acute Institutional Stay (SKILLED_NURSING_FACILITY): Payer: Medicare Other | Admitting: Internal Medicine

## 2018-11-08 DIAGNOSIS — F0150 Vascular dementia without behavioral disturbance: Secondary | ICD-10-CM | POA: Diagnosis not present

## 2018-11-08 DIAGNOSIS — R627 Adult failure to thrive: Secondary | ICD-10-CM | POA: Diagnosis not present

## 2018-11-08 DIAGNOSIS — Z515 Encounter for palliative care: Secondary | ICD-10-CM | POA: Diagnosis not present

## 2018-11-08 MED ORDER — MORPHINE SULFATE (CONCENTRATE) 10 MG /0.5 ML PO SOLN: 5.0000 mg | ORAL | 0 refills | Status: DC | PRN

## 2018-11-08 MED ORDER — LORAZEPAM 2 MG/ML PO CONC: 1.0000 mg | ORAL | 0 refills | Status: AC | PRN

## 2018-11-08 MED ORDER — LORAZEPAM 1 MG PO TABS: 1.0000 mg | ORAL_TABLET | ORAL | 1 refills | Status: DC | PRN

## 2018-12-03 NOTE — Progress Notes (Signed)
Location:  [facility] Room Number: [place] of Service:  SNF [phone]) Provider:  Veleta Miners, MD  Virgie Dad, MD  Patient Care Team: Virgie Dad, MD as PCP - General (Internal Medicine)  Extended Emergency Contact Information Primary Emergency Contact: Noreene Larsson of [place] Phone: [phone] Mobile Phone: [phone] Relation: Niece Secondary Emergency Contact: Harvie Bridge [place] of [place] Phone: [phone] Mobile Phone: [phone] Relation: Sister  Code Status:  DNR Goals of care: Advanced Directive information Advanced Directives 16-Nov-2018  Does Patient Have a Medical Advance Directive? Yes  Type of Advance Directive Out of facility DNR (pink MOST or yellow form)  Does patient want to make changes to medical advance directive? No - Patient declined  Copy of [place] in Chart? Yes - validated most recent copy scanned in chart (See row information)  Would patient like information on creating a medical advance directive? No - Patient declined  Pre-existing out of facility DNR order (yellow form or pink MOST form) Yellow form placed in chart (order not valid for inpatient use);Pink MOST form placed in chart (order not valid for inpatient use)     Chief Complaint  Patient presents with  . Acute Visit    End of Life care    HPI:  Pt is a 85 y.o. female seen today for an acute visit for End of life care Patient with Advanced dementia is now refusing all her Meds. Nurses are worried that she is uncomfortable with Pain and her breathing We do not have Hospice visiting the facility but  patient is comfort care in facility.  Patient has h/o Advanced dementia and Osteoporosis . She Had Sustained Right hip fracture which was followed by Left Hip fracture due to Fall in 07/16. Family decided not to get surgical repair. She has beenhere for Pain management since then  For Past few weeks  Nurses have noticed decrease in her appetite.  She was eating 25% of her meals Patient had stopped eating and taking her p.o. meds since Friday.  We had started her on morphine every 4 hours. Nurses seems to think that patient is in more distress today with high respiratory rate and moaning. She is also Spiking fever Patient would not respond to me would not even open her eyes.  She did look slightly tachypneic.  Her sisters are in her room.    Past Medical History:  Diagnosis Date  . Dementia ([place])   . Osteoarthritis    Past Surgical History:  Procedure Laterality Date  . BREAST SURGERY    . EYE SURGERY    . HIP ARTHROPLASTY Right 01/01/2014   Procedure: BIPOLAR ARTHROPLASTY RIGHT HIP;  Surgeon: Carole Civil, MD;  Location: [facility];  Service: Orthopedics;  Laterality: Right;    Allergies  Allergen Reactions  . Morphine And Related     Increased confusion    Outpatient Encounter Medications as of 11/16/18  Medication Sig  . acetaminophen (TYLENOL) 325 MG suppository Place 325 mg rectally every 4 (four) hours as needed. Per Standing order for fever / pain - do not exceed 3g of APAP / 24 hours considering all sources  . acetaminophen (TYLENOL) 500 MG tablet Give 1-2 tablets by mouth every 6 hours as needed for mild to moderate pain. DNE 3000mg  Tylenol in 24 hours from all sources.  . Dextromethorphan-guaiFENesin 5-100 MG/5ML LIQD Take 5 mLs by mouth every 6 (six) hours as needed.  Marland Kitchen  levothyroxine (SYNTHROID, LEVOTHROID) 25 MCG tablet Take 37.5 mcg by mouth daily before breakfast.   . loratadine (CLARITIN) 10 MG tablet Take 10 mg by mouth daily.  Marland Kitchen LORazepam (LORAZEPAM INTENSOL) 2 MG/ML concentrated solution Take 0.5 mLs (1 mg total) by mouth every 4 (four) hours as needed for up to 20 doses for anxiety.  Marland Kitchen morphine (ROXANOL) 20 MG/ML concentrated solution Give 5 mg / 0.25 ml by mouth every 2 hours as needed for anxiety / pain / increased resp  . NON FORMULARY Diet Type:   Downgrade to puree and nectar thick liquids  Magic cup give once a  day  . Nutritional Supplements (ENSURE ENLIVE PO) One by mouth daily - Strawberry if available  . petrolatum-hydrophilic-aloe vera (ALOE VESTA) ointment Apply to sacrum and bilateral buttocks every shift as needed for erythema after incontinence and for prevention  . sennosides-docusate sodium (SENOKOT-S) 8.6-50 MG tablet Take 1 tablet by mouth at bedtime.  . [DISCONTINUED] calcium-vitamin D (OSCAL WITH D) 500-200 MG-UNIT tablet Take 1 tablet by mouth 2 (two) times daily.  . [DISCONTINUED] citalopram (CELEXA) 10 MG tablet Take 20 mg by mouth daily.   . [DISCONTINUED] memantine (NAMENDA) 5 MG tablet Take 5 mg by mouth daily.  . [DISCONTINUED] Probiotic Product (PROBIOTIC DAILY) CAPS Take 1 tablet by mouth 3 (three) times daily.  . [DISCONTINUED] senna-docusate (SENOKOT-S) 8.6-50 MG per tablet Take 1 tablet by mouth at bedtime. (Patient not taking: Reported on 11/05/2018)   No facility-administered encounter medications on file as of 11-16-2018.     Review of Systems  Unable to perform ROS: Patient unresponsive    Immunization History  Administered Date(s) Administered  . Influenza-Unspecified 05/10/2014, 05/02/2016, 05/08/2017, 05/06/2018  . Pneumococcal Polysaccharide-23 01/02/2014  . Pneumococcal-Unspecified 05/28/2016  . Tdap 01/01/2014, 04/16/2017   Pertinent  Health Maintenance Due  Topic Date Due  . INFLUENZA VACCINE  03/05/2019  . DEXA SCAN  Completed  . PNA vac Low Risk Adult  Completed   Fall Risk  05/03/2018 04/23/2017  Falls in the past year? No No   Functional Status Survey:    Vitals:   11-16-2018 1202  BP: 122/68  Pulse: (!) 102  Resp: 20  Temp: 98.7 F (37.1 C)  Weight: 101 lb (45.8 kg)  Height: 5\' 3"  (1.6 m)   Body mass index is 17.89 kg/m. Physical Exam Vitals signs reviewed.  HENT:     Head: Normocephalic.     Nose: Nose normal.     Mouth/Throat:     Mouth: Mucous membranes are dry.   Eyes:     Pupils: Pupils are equal, round, and reactive to light.  Neck:     Musculoskeletal: Neck supple.  Cardiovascular:     Rate and Rhythm: Normal rate and regular rhythm.  Pulmonary:     Effort: Pulmonary effort is normal.     Breath sounds: Normal breath sounds.  Abdominal:     General: Abdomen is flat. Bowel sounds are normal.     Palpations: Abdomen is soft.  Musculoskeletal:        General: No swelling.  Skin:    General: Skin is warm and dry.  Neurological:     Mental Status: She is lethargic.     Comments: Unresponsive with Tachypnea  Psychiatric:        Mood and Affect: Mood normal.        Thought Content: Thought content normal.     Labs reviewed: Recent Labs    05/19/18 0700 07/13/18 0700  10/21/18 1800  NA 139 142 146*  K 4.3 3.9 4.1  CL 108 112* 114*  CO2 23 24 21*  GLUCOSE 102* 144* 89  BUN 25* 29* 29*  CREATININE 0.70 0.74 0.84  CALCIUM 8.6* 8.9 8.8*   Recent Labs    02/09/18 0721  AST 16  ALT 13  ALKPHOS 49  BILITOT 0.6  PROT 6.0*  ALBUMIN 3.1*   Recent Labs    02/09/18 0721 05/19/18 0700 07/13/18 0700 10/21/18 1800  WBC 4.9 4.1 4.5 9.3  NEUTROABS 2.4 2.0  --  6.6  HGB 11.0* 10.4* 10.6* 11.3*  HCT 34.7* 34.1* 34.3*  32.6* 36.9  MCV 99.1 100.9* 100.6* 99.2  PLT 182 212 165 276   Lab Results  Component Value Date   TSH 5.143 (H) 08/09/2018   No results found for: HGBA1C No results found for: CHOL, HDL, LDLCALC, LDLDIRECT, TRIG, CHOLHDL  Significant Diagnostic Results in last 30 days:  No results found.  Assessment/Plan Patient with Advance Dementia and now failure to thrive and End of life  Will increase Roxanol 5 mg Q 2 hours prn for Respiratory distress Tylenol suppository for Fever Ativan Q 4 for Anxiety      Family/ staff Communication: d/w the sisters in room  Labs/tests ordered:   Total time spent in this patient care encounter was _25 minutes; greater than 50% of the visit spent counseling staff and her  family, reviewing records , Labs and coordinating care for problems addressed at this encounter.

## 2018-12-03 DEATH — deceased
